# Patient Record
Sex: Female | Born: 1965 | Race: Black or African American | Hispanic: No | Marital: Married
Health system: Southern US, Academic
[De-identification: ages and names within clinical notes are randomized; demographics above are authoritative.]

## PROBLEM LIST (undated history)

## (undated) ENCOUNTER — Ambulatory Visit: Payer: PRIVATE HEALTH INSURANCE

## (undated) ENCOUNTER — Other Ambulatory Visit

## (undated) ENCOUNTER — Telehealth

## (undated) ENCOUNTER — Ambulatory Visit

## (undated) ENCOUNTER — Encounter

## (undated) ENCOUNTER — Ambulatory Visit
Payer: PRIVATE HEALTH INSURANCE | Attending: Student in an Organized Health Care Education/Training Program | Primary: Student in an Organized Health Care Education/Training Program

## (undated) ENCOUNTER — Encounter
Attending: Student in an Organized Health Care Education/Training Program | Primary: Student in an Organized Health Care Education/Training Program

## (undated) ENCOUNTER — Ambulatory Visit: Attending: Family | Primary: Family

## (undated) ENCOUNTER — Ambulatory Visit
Payer: PRIVATE HEALTH INSURANCE | Attending: Pharmacist Clinician (PhC)/ Clinical Pharmacy Specialist | Primary: Pharmacist Clinician (PhC)/ Clinical Pharmacy Specialist

## (undated) ENCOUNTER — Telehealth
Attending: Student in an Organized Health Care Education/Training Program | Primary: Student in an Organized Health Care Education/Training Program

## (undated) ENCOUNTER — Encounter: Attending: Physician Assistant | Primary: Physician Assistant

## (undated) ENCOUNTER — Encounter: Attending: Ambulatory Care | Primary: Ambulatory Care

## (undated) ENCOUNTER — Ambulatory Visit: Payer: PRIVATE HEALTH INSURANCE | Attending: Physician Assistant | Primary: Physician Assistant

## (undated) ENCOUNTER — Encounter: Attending: Family Medicine | Primary: Family Medicine

## (undated) ENCOUNTER — Ambulatory Visit: Payer: Medicaid (Managed Care)

## (undated) ENCOUNTER — Encounter: Attending: Pharmacist | Primary: Pharmacist

## (undated) ENCOUNTER — Encounter: Attending: Neurology | Primary: Neurology

## (undated) ENCOUNTER — Telehealth: Attending: Ambulatory Care | Primary: Ambulatory Care

## (undated) ENCOUNTER — Inpatient Hospital Stay

## (undated) ENCOUNTER — Ambulatory Visit: Payer: MEDICAID

## (undated) ENCOUNTER — Ambulatory Visit: Attending: Clinical | Primary: Clinical

## (undated) ENCOUNTER — Ambulatory Visit: Payer: PRIVATE HEALTH INSURANCE | Attending: Ambulatory Care | Primary: Ambulatory Care

## (undated) ENCOUNTER — Ambulatory Visit
Payer: MEDICAID | Attending: Student in an Organized Health Care Education/Training Program | Primary: Student in an Organized Health Care Education/Training Program

## (undated) ENCOUNTER — Telehealth: Attending: Family | Primary: Family

## (undated) ENCOUNTER — Ambulatory Visit: Payer: PRIVATE HEALTH INSURANCE | Attending: Family Medicine | Primary: Family Medicine

## (undated) ENCOUNTER — Ambulatory Visit: Attending: Family Medicine | Primary: Family Medicine

## (undated) ENCOUNTER — Ambulatory Visit: Attending: Physical Medicine & Rehabilitation | Primary: Physical Medicine & Rehabilitation

## (undated) ENCOUNTER — Encounter: Attending: Internal Medicine | Primary: Internal Medicine

## (undated) ENCOUNTER — Ambulatory Visit: Attending: Ambulatory Care | Primary: Ambulatory Care

## (undated) ENCOUNTER — Ambulatory Visit
Attending: Student in an Organized Health Care Education/Training Program | Primary: Student in an Organized Health Care Education/Training Program

## (undated) ENCOUNTER — Ambulatory Visit
Payer: Medicaid (Managed Care) | Attending: Student in an Organized Health Care Education/Training Program | Primary: Student in an Organized Health Care Education/Training Program

## (undated) ENCOUNTER — Encounter: Attending: Emergency Medicine | Primary: Emergency Medicine

## (undated) ENCOUNTER — Telehealth: Attending: Mental Health | Primary: Mental Health

## (undated) ENCOUNTER — Ambulatory Visit: Attending: Pharmacist | Primary: Pharmacist

## (undated) ENCOUNTER — Encounter: Attending: Family | Primary: Family

## (undated) ENCOUNTER — Ambulatory Visit: Payer: PRIVATE HEALTH INSURANCE | Attending: Internal Medicine | Primary: Internal Medicine

## (undated) ENCOUNTER — Telehealth: Attending: Pharmacist | Primary: Pharmacist

## (undated) ENCOUNTER — Inpatient Hospital Stay: Payer: PRIVATE HEALTH INSURANCE

## (undated) ENCOUNTER — Telehealth: Attending: Rheumatology | Primary: Rheumatology

## (undated) ENCOUNTER — Other Ambulatory Visit: Attending: Clinical | Primary: Clinical

## (undated) DIAGNOSIS — E119 Type 2 diabetes mellitus without complications: Secondary | ICD-10-CM

## (undated) DIAGNOSIS — I10 Essential (primary) hypertension: Secondary | ICD-10-CM

---

## 2017-06-17 ENCOUNTER — Emergency Department
Admission: EM | Admit: 2017-06-17 | Discharge: 2017-06-17 | Disposition: A | Discharge status: Home / Self Care | Source: Intra-hospital

## 2017-06-17 DIAGNOSIS — R079 Chest pain, unspecified: Principal | ICD-10-CM

## 2019-01-15 ENCOUNTER — Other Ambulatory Visit: Payer: Self-pay

## 2019-01-15 ENCOUNTER — Emergency Department
Admission: EM | Admit: 2019-01-15 | Discharge: 2019-01-16 | Disposition: A | Discharge status: Home / Self Care | Payer: Self-pay | Attending: Emergency Medicine | Admitting: Emergency Medicine

## 2019-01-15 DIAGNOSIS — I10 Essential (primary) hypertension: Secondary | ICD-10-CM | POA: Insufficient documentation

## 2019-01-15 DIAGNOSIS — R45851 Suicidal ideations: Secondary | ICD-10-CM | POA: Insufficient documentation

## 2019-01-15 DIAGNOSIS — F1414 Cocaine abuse with cocaine-induced mood disorder: Secondary | ICD-10-CM | POA: Diagnosis present

## 2019-01-15 DIAGNOSIS — F329 Major depressive disorder, single episode, unspecified: Secondary | ICD-10-CM | POA: Insufficient documentation

## 2019-01-15 DIAGNOSIS — E119 Type 2 diabetes mellitus without complications: Secondary | ICD-10-CM | POA: Insufficient documentation

## 2019-01-15 DIAGNOSIS — F17210 Nicotine dependence, cigarettes, uncomplicated: Secondary | ICD-10-CM | POA: Insufficient documentation

## 2019-01-15 HISTORY — DX: Essential (primary) hypertension: I10

## 2019-01-15 HISTORY — DX: Type 2 diabetes mellitus without complications: E11.9

## 2019-01-15 LAB — COMPREHENSIVE METABOLIC PANEL
ALT: 16 U/L (ref 0–44)
AST: 32 U/L (ref 15–41)
Albumin: 3.7 g/dL (ref 3.5–5.0)
Alkaline Phosphatase: 78 U/L (ref 38–126)
Anion gap: 15 (ref 5–15)
BUN: 7 mg/dL (ref 6–20)
CO2: 20 mmol/L — ABNORMAL LOW (ref 22–32)
Calcium: 9.1 mg/dL (ref 8.9–10.3)
Chloride: 105 mmol/L (ref 98–111)
Creatinine, Ser: 0.59 mg/dL (ref 0.44–1.00)
GFR calc Af Amer: >60 mL/min (ref >60)
GFR calc non Af Amer: >60 mL/min (ref >60)
Glucose, Bld: 326 mg/dL — ABNORMAL HIGH (ref 70–99)
Potassium: 3 mmol/L — ABNORMAL LOW (ref 3.5–5.1)
Sodium: 140 mmol/L (ref 135–145)
Total Bilirubin: 0.7 mg/dL (ref 0.3–1.2)
Total Protein: 7.2 g/dL (ref 6.5–8.1)

## 2019-01-15 LAB — CBC
HCT: 39.4 % (ref 36.0–46.0)
Hemoglobin: 12.8 g/dL (ref 12.0–15.0)
MCH: 29.3 pg (ref 26.0–34.0)
MCHC: 32.5 g/dL (ref 30.0–36.0)
MCV: 90.2 fL (ref 80.0–100.0)
Platelets: 192 10*3/uL (ref 150–400)
RBC: 4.37 MIL/uL (ref 3.87–5.11)
RDW: 14.1 % (ref 11.5–15.5)
WBC: 7.6 10*3/uL (ref 4.0–10.5)
nRBC: 0 % (ref 0.0–0.2)

## 2019-01-15 LAB — GLUCOSE, CAPILLARY: Glucose-Capillary: 288 mg/dL — ABNORMAL HIGH (ref 70–99)

## 2019-01-15 LAB — SALICYLATE LEVEL: Salicylate Lvl: <7 mg/dL (ref 2.8–30.0)

## 2019-01-15 LAB — ETHANOL: Alcohol, Ethyl (B): <10 mg/dL (ref <10)

## 2019-01-15 LAB — ACETAMINOPHEN LEVEL: Acetaminophen (Tylenol), Serum: <10 ug/mL — ABNORMAL LOW (ref 10–30)

## 2019-01-15 MED ORDER — MIRTAZAPINE 15 MG PO TABS
15.0000 mg | ORAL_TABLET | Freq: Every day | ORAL | Status: DC
  Administered 2019-01-16: 15 mg via ORAL
  Filled 2019-01-15: qty 1

## 2019-01-15 NOTE — Consult Note (Signed)
[facility] Face-to-Face Psychiatry Consult   Reason for Consult:  Suicidal ideations Referring Physician:  EDP Patient Identification: Katrina Lindsey MRN:  [id number] Principal Diagnosis: Cocaine abuse with cocaine-induced mood disorder (HCC) Diagnosis:  Principal Problem:   Cocaine abuse with cocaine-induced mood disorder (HCC)   Total Time spent with patient: 45 minutes  Subjective:   Katrina Lindsey is a 53 y.o. female patient had suicidal ideations earlier today after relapsing on cocaine 2 days ago after 14 years of being clean.  She is "very depressed" but does not want to hurt herself.  She stopped taking her medications two days ago and would like to restart these.  Agreeable to stay overnight to stabilize.  HPI:  53 yo female who presented after using cocaine and having suicidal ideations.  No current suicidal ideations but due to her depression and not being on medications for two days, would like to keep overnight to manage her medications and provide support.  No homicidal ideations, hallucinations, or withdrawal symptoms.  Sleep has been less due to cocaine use, appetite is good.  She does see a psychiatrist outpatient and prefers not to be admitted to [facility] as she feels she will be stable tomorrow.  Past Psychiatric History: depression, substance abuse  Risk to Self:  mild at this time Risk to Others:  none Prior Inpatient Therapy:  yes Prior Outpatient Therapy:  yes  Past Medical History:  Past Medical History:  Diagnosis Date  . Diabetes mellitus without complication (HCC)   . Hypertension    History reviewed. No pertinent surgical history. Family History: No family history on file. Family Psychiatric  History: none Social History:  Social History   Substance and Sexual Activity  Alcohol Use Not Currently     Social History   Substance and Sexual Activity  Drug Use Yes   Comment: crack, last use 4/29    Social History   Socioeconomic History  . Marital status:  Single    Spouse name: Not on file  . Number of children: Not on file  . Years of education: Not on file  . Highest education level: Not on file  Occupational History  . Not on file  Social Needs  . Financial resource strain: Not on file  . Food insecurity:    Worry: Not on file    Inability: Not on file  . Transportation needs:    Medical: Not on file    Non-medical: Not on file  Tobacco Use  . Smoking status: Current Every Day Smoker  Substance and Sexual Activity  . Alcohol use: Not Currently  . Drug use: Yes    Comment: crack, last use 4/29  . Sexual activity: Not on file  Lifestyle  . Physical activity:    Days per week: Not on file    Minutes per session: Not on file  . Stress: Not on file  Relationships  . Social connections:    Talks on phone: Not on file    Gets together: Not on file    Attends religious service: Not on file    Active member of club or organization: Not on file    Attends meetings of clubs or organizations: Not on file    Relationship status: Not on file  Other Topics Concern  . Not on file  Social History Narrative  . Not on file   Additional Social History:    Allergies:  Allergies no known allergies  Labs:  Results for orders placed or performed during the  hospital encounter of 01/15/19 (from the past 48 hour(s))  Comprehensive metabolic panel     Status: Abnormal   Collection Time: 01/15/19  5:37 PM  Result Value Ref Range   Sodium 140 135 - 145 mmol/L   Potassium 3.0 (L) 3.5 - 5.1 mmol/L   Chloride 105 98 - 111 mmol/L   CO2 20 (L) 22 - 32 mmol/L   Glucose, Bld 326 (H) 70 - 99 mg/dL   BUN 7 6 - 20 mg/dL   Creatinine, Ser 2.95 0.44 - 1.00 mg/dL   Calcium 9.1 8.9 - 62.1 mg/dL   Total Protein 7.2 6.5 - 8.1 g/dL   Albumin 3.7 3.5 - 5.0 g/dL   AST 32 15 - 41 U/L   ALT 16 0 - 44 U/L   Alkaline Phosphatase 78 38 - 126 U/L   Total Bilirubin 0.7 0.3 - 1.2 mg/dL   GFR calc non Af Amer >60 >60 mL/min   GFR calc Af Amer >60 >60 mL/min    Anion gap 15 5 - 15    Comment: Performed at [facility], [address] [place], [place] [address]  Ethanol     Status: None   Collection Time: 01/15/19  5:37 PM  Result Value Ref Range   Alcohol, Ethyl (B) <10 <10 mg/dL    Comment: (NOTE) Lowest detectable limit for serum alcohol is 10 mg/dL. For medical purposes only. Performed at [facility], [address] [place], [place] [address]   Salicylate level     Status: None   Collection Time: 01/15/19  5:37 PM  Result Value Ref Range   Salicylate Lvl <7.0 2.8 - 30.0 mg/dL    Comment: Performed at [facility], [address] [place], [place] [address]  Acetaminophen level     Status: Abnormal   Collection Time: 01/15/19  5:37 PM  Result Value Ref Range   Acetaminophen (Tylenol), Serum <10 (L) 10 - 30 ug/mL    Comment: (NOTE) Therapeutic concentrations vary significantly. A range of 10-30 ug/mL  may be an effective concentration for many patients. However, some  are best treated at concentrations outside of this range. Acetaminophen concentrations >150 ug/mL at 4 hours after ingestion  and >50 ug/mL at 12 hours after ingestion are often associated with  toxic reactions. Performed at [facility], [address] [place], [place] [address]   cbc     Status: None   Collection Time: 01/15/19  5:37 PM  Result Value Ref Range   WBC 7.6 4.0 - 10.5 K/uL   RBC 4.37 3.87 - 5.11 MIL/uL   Hemoglobin 12.8 12.0 - 15.0 g/dL   HCT 44.0 10.2 - 72.5 %   MCV 90.2 80.0 - 100.0 fL   MCH 29.3 26.0 - 34.0 pg   MCHC 32.5 30.0 - 36.0 g/dL   RDW 36.6 44.0 - 34.7 %   Platelets 192 150 - 400 K/uL   nRBC 0.0 0.0 - 0.2 %    Comment: Performed at [facility], [address] [place], [place] [address]    No current facility-administered medications for this encounter.    No current outpatient medications on file.    Musculoskeletal: Strength & Muscle Tone: within normal limits Gait &  Station: normal Patient leans: N/A  Psychiatric Specialty Exam: Physical Exam  Nursing note and vitals reviewed. Constitutional: She is oriented to person, place, and time. She appears well-developed and well-nourished.  Neck: Normal range of motion.  Respiratory: Effort normal.  Musculoskeletal: Normal range of motion.  Neurological: She is alert and oriented to person, place, and time.  Psychiatric: Her speech is normal and behavior is normal. Judgment normal. Her mood appears anxious. Cognition and memory are normal. She exhibits a depressed mood. She expresses suicidal ideation.    Review of Systems  Psychiatric/Behavioral: Positive for depression and substance abuse. The patient is nervous/anxious.   All other systems reviewed and are negative.   Blood pressure 120/81, pulse (!) 101, temperature 98 F (36.7 C), temperature source Oral, resp. rate 18, height 5\' 5"  (1.651 m), weight 89.8 kg, SpO2 98 %.Body mass index is 32.95 kg/m.  General Appearance: Casual  Eye Contact:  Fair  Speech:  Normal Rate  Volume:  Normal  Mood:  Anxious and Depressed  Affect:  Congruent  Thought Process:  Coherent and Descriptions of Associations: Intact  Orientation:  Full (Time, Place, and Person)  Thought Content:  Rumination  Suicidal Thoughts:  No  Homicidal Thoughts:  No  Memory:  Immediate;   Fair Recent;   Fair Remote;   Fair  Judgement:  Fair  Insight:  Fair  Psychomotor Activity:  Decreased  Concentration:  Concentration: Fair and Attention Span: Fair  Recall:  [employer]Fair  Fund of Knowledge:  Fair  Language:  Good  Akathisia:  No  Handed:  Right  AIMS (if indicated):     Assets:  Housing Leisure Time Physical Health Resilience Social Support  ADL's:  Intact  Cognition:  WNL  Sleep:        Treatment Plan Summary: Daily contact with patient to assess and evaluate symptoms and progress in treatment, Medication management and Plan cocaine abuse with cocaine induced mood  disorder:   Insomnia: -Restarted Remeron 15 mg at bedtime  Diabetes: -Had her Levemir prior to ED and reports she takes her other medications in the  Morning and can take those when she is discharged in the morning.  Disposition: Supportive therapy provided about ongoing stressors.  Nanine MeansJamison Lord, NP 01/15/2019 6:23 PM

## 2019-01-15 NOTE — ED Notes (Signed)
Cigarettes, medications, lighter, cell phone, black shoes, jeans, blue jacket, black bra, silver colored watch placed in patient belonging bag labeled with her name 1 of 1. BPD this RN and EDT, Kadijah at bedside when patient changed compliantly. Kept glasses.

## 2019-01-15 NOTE — Progress Notes (Signed)
Patient reported taking her Levemir prior to coming to the ED and does not need any of her medications until the morning.  She has them with her, locked up, and will take them at discharge in the am.  Agreeable to blood glucose monitoring but reports no sliding scale coverage.  Typically takes her blood glucose in the morning and sometimes at night, ordered before meals and at bedtime.  She just finished eating her dinner tray.  Nanine Means, PMHNP

## 2019-01-15 NOTE — ED Provider Notes (Signed)
[facility] Emergency Department Provider Note   ____________________________________________   First MD Initiated Contact with Patient 01/15/19 1753     (approximate)  I have reviewed the triage vital signs and the nursing notes.   HISTORY  Chief Complaint Suicidal    HPI Katrina Lindsey is a 53 y.o. female here for evaluation of suicidal thoughts  Patient reports that she has a history of psychiatric disease, she has a long standing history of cocaine abuse in the past but reports that she relapsed on cocaine about 3 days ago after being clean for many years.  She reports she has been feeling down and sad that she relapsed, today she started having thoughts about wanting to overdose on her home medications but has taken no action to harm herself and denies that she wants to hurt herself  at present but is concerned she started to have feelings that she wants to overdose  No chest pain.  No fevers or chills.  No recent illness.  No nausea vomiting.  She feels well at the moment, but endorses feeling very low in mood and very depressed that she started to use drugs again after so many years  Past Medical History:  Diagnosis Date  . Diabetes mellitus without complication (HCC)   . Hypertension     Patient Active Problem List   Diagnosis Date Noted  . Cocaine abuse with cocaine-induced mood disorder (HCC) 01/15/2019    History reviewed. No pertinent surgical history.  Prior to Admission medications   Not on File    Allergies Patient has no known allergies.  No family history on file.  Social History Social History   Tobacco Use  . Smoking status: Current Every Day Smoker  Substance Use Topics  . Alcohol use: Not Currently  . Drug use: Yes    Comment: crack, last use 4/29    Review of Systems Constitutional: No fever/chills Eyes: No visual changes. ENT: No sore throat. Cardiovascular: Denies chest pain. Respiratory: Denies  shortness of breath. Gastrointestinal: No abdominal pain.   Genitourinary: Negative for dysuria.  Not pregnant. Musculoskeletal: Negative for back pain. Skin: Negative for rash. Neurological: Negative for headaches, areas of focal weakness or numbness.  Denies hallucinations.  ____________________________________________   PHYSICAL EXAM:  VITAL SIGNS: ED Triage Vitals  Enc Vitals Group     BP 01/15/19 1733 120/81     Pulse Rate 01/15/19 1733 (!) 101     Resp 01/15/19 1733 18     Temp 01/15/19 1733 98 F (36.7 C)     Temp Source 01/15/19 1733 Oral     SpO2 01/15/19 1733 98 %     Weight 01/15/19 1734 198 lb (89.8 kg)     Height 01/15/19 1734 5\' 5"  (1.651 m)     Head Circumference --      Peak Flow --      Pain Score 01/15/19 1734 0     Pain Loc --      Pain Edu? --      Excl. in GC? --     Constitutional: Alert and oriented. Well appearing and in no acute distress. Eyes: Conjunctivae are normal. Head: Atraumatic. Nose: No congestion/rhinnorhea. Mouth/Throat: Mucous membranes are moist. Neck: No stridor.  Cardiovascular: Normal rate, regular rhythm. Grossly normal heart sounds.  Good peripheral circulation. Respiratory: Normal respiratory effort.  No retractions. Lungs CTAB. Gastrointestinal: Soft and nontender. No distention. Musculoskeletal: No lower extremity tenderness nor edema. Neurologic:  Normal speech and language. No  gross focal neurologic deficits are appreciated.  Skin:  Skin is warm, dry and intact. No rash noted. Psychiatric: Mood and affect are slightly flat.  Denies active desire to harm herself, reports she was having thoughts of overdosing on her medications earlier.  She does present voluntarily as well, and she reports she is in agreement with wanting to see a psychiatrist  ____________________________________________   LABS (all labs ordered are listed, but only abnormal results are displayed)  Labs Reviewed  COMPREHENSIVE METABOLIC PANEL -  Abnormal; Notable for the following components:      Result Value   Potassium 3.0 (*)    CO2 20 (*)    Glucose, Bld 326 (*)    All other components within normal limits  ACETAMINOPHEN LEVEL - Abnormal; Notable for the following components:   Acetaminophen (Tylenol), Serum <10 (*)    All other components within normal limits  GLUCOSE, CAPILLARY - Abnormal; Notable for the following components:   Glucose-Capillary 288 (*)    All other components within normal limits  ETHANOL  SALICYLATE LEVEL  CBC  URINE DRUG SCREEN, QUALITATIVE ([facility] ONLY)   ____________________________________________  EKG   ____________________________________________  RADIOLOGY   ____________________________________________   PROCEDURES  Procedure(s) performed: None  Procedures  Critical Care performed: No  ____________________________________________   INITIAL IMPRESSION / ASSESSMENT AND PLAN / ED COURSE  Pertinent labs & imaging results that were available during my care of the patient were reviewed by me and considered in my medical decision making (see chart for details).   Patient presents for suicidal thoughts.  She reports feeling remorseful about using cocaine and relapsing.  Seems to have driven her symptomatology.  She presents voluntarily, and she ripped ports and is actively seeking assistance.  She denies active acute medical symptomatology, and denies overdose and has negative a SA and acetaminophen levels with normal mental status.  ----------------------------------------- 6:24 PM on 01/15/2019 -----------------------------------------  Psychiatry nurse practitioner Asher MuirJamie, has seen the patient.  Advising that they will restart her medications and observe her overnight and is agreeable with the patient being on voluntary basis at this time.  ----------------------------------------- 12:00 AM on 01/16/2019 -----------------------------------------  Patient is now under IVC, no  family member took IVC papers out on her.  They have performed examination.  Psychiatry has been consulted and continue to follow along with the patient at this time.  Disposition pending per psychiatric team.  Ongoing care assigned to Dr. Roxan Hockeyobinson      ____________________________________________   FINAL CLINICAL IMPRESSION(S) / ED DIAGNOSES  Final diagnoses:  Suicidal ideation        Note:  This document was prepared using Dragon voice recognition software and may include unintentional dictation errors       Sharyn CreamerQuale, Cristal Qadir, MD 01/16/19 0001

## 2019-01-15 NOTE — ED Triage Notes (Signed)
Arrives to ER via BPD c/o suicidal thoughts today. Pt reports that she hasn't had thoughts of suicide for "years". Pt reports that she considered taking all of her medications at one time. Denies HI. Pt alert and oriented X4, active, cooperative, pt in NAD. RR even and unlabored, color WNL.

## 2019-01-16 NOTE — ED Notes (Signed)
SOC called report given.  SOC machine moved into patients room.  Pt. Given some OJ to help to wake up.

## 2019-01-16 NOTE — ED Provider Notes (Addendum)
-----------------------------------------   4:02 AM on 01/16/2019 -----------------------------------------   Blood pressure 120/81, pulse (!) 101, temperature 98 F (36.7 C), temperature source Oral, resp. rate 18, height 5\' 5"  (1.651 m), weight 89.8 kg, SpO2 98 %.  The patient is calm and cooperative at this time.  There have been no acute events since the last update.  Awaiting disposition plan from Behavioral Medicine team.     Willy Eddy, MD 01/16/19 [phone]

## 2019-01-16 NOTE — ED Provider Notes (Signed)
The patient has been evaluated at bedside by  [facility], psychiatry.  Patient is clinically stable.  Not felt to be a danger to self or others.  No SI or Hi.  No indication for inpatient psychiatric admission at this time.  Appropriate for continued outpatient therapy.    Willy Eddy, MD 01/16/19 [phone]

## 2019-05-18 ENCOUNTER — Emergency Department
Admission: EM | Admit: 2019-05-18 | Discharge: 2019-05-19 | Disposition: A | Discharge status: Other Acute Inpatient Hospital | Payer: Self-pay | Attending: Emergency Medicine | Admitting: Emergency Medicine

## 2019-05-18 ENCOUNTER — Encounter: Payer: Self-pay | Admitting: Emergency Medicine

## 2019-05-18 ENCOUNTER — Other Ambulatory Visit: Payer: Self-pay

## 2019-05-18 DIAGNOSIS — Z20828 Contact with and (suspected) exposure to other viral communicable diseases: Secondary | ICD-10-CM | POA: Insufficient documentation

## 2019-05-18 DIAGNOSIS — F17200 Nicotine dependence, unspecified, uncomplicated: Secondary | ICD-10-CM | POA: Insufficient documentation

## 2019-05-18 DIAGNOSIS — J45909 Unspecified asthma, uncomplicated: Secondary | ICD-10-CM | POA: Insufficient documentation

## 2019-05-18 DIAGNOSIS — R45851 Suicidal ideations: Secondary | ICD-10-CM

## 2019-05-18 DIAGNOSIS — F332 Major depressive disorder, recurrent severe without psychotic features: Secondary | ICD-10-CM | POA: Insufficient documentation

## 2019-05-18 DIAGNOSIS — I10 Essential (primary) hypertension: Secondary | ICD-10-CM | POA: Insufficient documentation

## 2019-05-18 DIAGNOSIS — F1414 Cocaine abuse with cocaine-induced mood disorder: Secondary | ICD-10-CM | POA: Insufficient documentation

## 2019-05-18 LAB — COMPREHENSIVE METABOLIC PANEL
ALT: 11 U/L (ref 0–44)
AST: 17 U/L (ref 15–41)
Albumin: 4.3 g/dL (ref 3.5–5.0)
Alkaline Phosphatase: 72 U/L (ref 38–126)
Anion gap: 11 (ref 5–15)
BUN: 13 mg/dL (ref 6–20)
CO2: 24 mmol/L (ref 22–32)
Calcium: 9.7 mg/dL (ref 8.9–10.3)
Chloride: 104 mmol/L (ref 98–111)
Creatinine, Ser: 0.71 mg/dL (ref 0.44–1.00)
GFR calc Af Amer: >60 mL/min (ref >60)
GFR calc non Af Amer: >60 mL/min (ref >60)
Glucose, Bld: 137 mg/dL — ABNORMAL HIGH (ref 70–99)
Potassium: 3.2 mmol/L — ABNORMAL LOW (ref 3.5–5.1)
Sodium: 139 mmol/L (ref 135–145)
Total Bilirubin: 0.7 mg/dL (ref 0.3–1.2)
Total Protein: 7.4 g/dL (ref 6.5–8.1)

## 2019-05-18 LAB — URINE DRUG SCREEN, QUALITATIVE (ARMC ONLY)
Amphetamines, Ur Screen: NOT DETECTED
Barbiturates, Ur Screen: NOT DETECTED
Benzodiazepine, Ur Scrn: NOT DETECTED
Cannabinoid 50 Ng, Ur ~~LOC~~: NOT DETECTED
Cocaine Metabolite,Ur ~~LOC~~: POSITIVE — AB
MDMA (Ecstasy)Ur Screen: NOT DETECTED
Methadone Scn, Ur: NOT DETECTED
Opiate, Ur Screen: NOT DETECTED
Phencyclidine (PCP) Ur S: NOT DETECTED
Tricyclic, Ur Screen: NOT DETECTED

## 2019-05-18 LAB — SARS CORONAVIRUS 2 BY RT PCR (HOSPITAL ORDER, PERFORMED IN ~~LOC~~ HOSPITAL LAB): SARS Coronavirus 2: NEGATIVE

## 2019-05-18 LAB — ETHANOL: Alcohol, Ethyl (B): <10 mg/dL (ref <10)

## 2019-05-18 LAB — ACETAMINOPHEN LEVEL: Acetaminophen (Tylenol), Serum: <10 ug/mL — ABNORMAL LOW (ref 10–30)

## 2019-05-18 LAB — SALICYLATE LEVEL: Salicylate Lvl: <7 mg/dL (ref 2.8–30.0)

## 2019-05-18 MED ORDER — TRAZODONE HCL 50 MG PO TABS
50.0000 mg | ORAL_TABLET | Freq: Every day | ORAL | Status: DC
  Administered 2019-05-18: 50 mg via ORAL
  Filled 2019-05-18: qty 1

## 2019-05-18 NOTE — ED Notes (Signed)
Pt. Alert and oriented, warm and dry, in no distress. Pt. Denies SI, HI, and AVH. Pt. Encouraged to let nursing staff know of any concerns or needs. 
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
## 2019-05-18 NOTE — ED Notes (Addendum)
RN stepped out to speak to first nurse about room in ED, pt began undressing herself into hospital attire.  Pt has cell phone, silver and white slides, pair of  Underwear, t-shirt that looks like a scrub top. Silver watch and red bag with insulin pen, lighter and cigarettes.

## 2019-05-18 NOTE — ED Notes (Signed)
Pt unable to void at this time. 

## 2019-05-19 ENCOUNTER — Inpatient Hospital Stay
Admission: RE | Admit: 2019-05-19 | Discharge: 2019-05-19 | DRG: 882 | Disposition: A | Discharge status: Home / Self Care | Payer: Self-pay | Source: Intra-hospital | Attending: Psychiatry | Admitting: Psychiatry

## 2019-05-19 DIAGNOSIS — E039 Hypothyroidism, unspecified: Secondary | ICD-10-CM

## 2019-05-19 DIAGNOSIS — E119 Type 2 diabetes mellitus without complications: Secondary | ICD-10-CM

## 2019-05-19 DIAGNOSIS — F4323 Adjustment disorder with mixed anxiety and depressed mood: Principal | ICD-10-CM | POA: Diagnosis present

## 2019-05-19 DIAGNOSIS — F250 Schizoaffective disorder, bipolar type: Secondary | ICD-10-CM | POA: Diagnosis present

## 2019-05-19 DIAGNOSIS — G4700 Insomnia, unspecified: Secondary | ICD-10-CM | POA: Diagnosis present

## 2019-05-19 DIAGNOSIS — F1414 Cocaine abuse with cocaine-induced mood disorder: Secondary | ICD-10-CM | POA: Diagnosis present

## 2019-05-19 DIAGNOSIS — F322 Major depressive disorder, single episode, severe without psychotic features: Secondary | ICD-10-CM | POA: Insufficient documentation

## 2019-05-19 DIAGNOSIS — F17200 Nicotine dependence, unspecified, uncomplicated: Secondary | ICD-10-CM | POA: Diagnosis present

## 2019-05-19 DIAGNOSIS — R45851 Suicidal ideations: Secondary | ICD-10-CM | POA: Diagnosis present

## 2019-05-19 DIAGNOSIS — I10 Essential (primary) hypertension: Secondary | ICD-10-CM | POA: Diagnosis present

## 2019-05-19 LAB — GLUCOSE, CAPILLARY
Glucose-Capillary: 278 mg/dL — ABNORMAL HIGH (ref 70–99)
Glucose-Capillary: 161 mg/dL — ABNORMAL HIGH (ref 70–99)

## 2019-05-19 MED ORDER — ATORVASTATIN CALCIUM 20 MG PO TABS: 40.0000 mg | ORAL_TABLET | Freq: Every day | ORAL | Status: DC

## 2019-05-19 MED ORDER — MAGNESIUM HYDROXIDE 400 MG/5ML PO SUSP: 30.0000 mL | Freq: Every day | ORAL | Status: DC | PRN

## 2019-05-19 MED ORDER — LORAZEPAM 1 MG PO TABS
1.0000 mg | ORAL_TABLET | Freq: Once | ORAL | Status: AC
  Administered 2019-05-19: 02:00:00 1 mg via ORAL
  Filled 2019-05-19: qty 1

## 2019-05-19 MED ORDER — LORATADINE 10 MG PO TABS: 10.0000 mg | ORAL_TABLET | Freq: Every day | ORAL | Status: DC

## 2019-05-19 MED ORDER — LISINOPRIL 20 MG PO TABS: 20.0000 mg | ORAL_TABLET | Freq: Every day | ORAL | 0 refills | Status: AC

## 2019-05-19 MED ORDER — LORATADINE 10 MG PO TABS: 10.0000 mg | ORAL_TABLET | Freq: Every day | ORAL | 0 refills | Status: AC

## 2019-05-19 MED ORDER — INSULIN ASPART PROT & ASPART (70-30 MIX) 100 UNIT/ML ~~LOC~~ SUSP
25.0000 [IU] | Freq: Two times a day (BID) | SUBCUTANEOUS | Status: DC
  Administered 2019-05-19: 08:00:00 25 [IU] via SUBCUTANEOUS
  Filled 2019-05-19: qty 10

## 2019-05-19 MED ORDER — ALUM & MAG HYDROXIDE-SIMETH 200-200-20 MG/5ML PO SUSP: 30.0000 mL | ORAL | Status: DC | PRN

## 2019-05-19 MED ORDER — DIVALPROEX SODIUM ER 500 MG PO TB24: 1000.0000 mg | ORAL_TABLET | Freq: Every day | ORAL | Status: DC

## 2019-05-19 MED ORDER — DIVALPROEX SODIUM ER 500 MG PO TB24: 1000.0000 mg | ORAL_TABLET | Freq: Every day | ORAL | 0 refills | Status: AC

## 2019-05-19 MED ORDER — TRAZODONE HCL 50 MG PO TABS: 50.0000 mg | ORAL_TABLET | Freq: Every day | ORAL | 0 refills | Status: AC

## 2019-05-19 MED ORDER — ZIPRASIDONE HCL 20 MG PO CAPS: 100.0000 mg | ORAL_CAPSULE | Freq: Every day | ORAL | Status: DC

## 2019-05-19 MED ORDER — ASPIRIN EC 81 MG PO TBEC: 81.0000 mg | DELAYED_RELEASE_TABLET | Freq: Every day | ORAL | Status: DC

## 2019-05-19 MED ORDER — LEVOTHYROXINE SODIUM 100 MCG PO TABS: 100.0000 ug | ORAL_TABLET | Freq: Every day | ORAL | Status: DC

## 2019-05-19 MED ORDER — ASPIRIN 81 MG PO TBEC: 81.0000 mg | DELAYED_RELEASE_TABLET | Freq: Every day | ORAL | 0 refills | Status: AC

## 2019-05-19 MED ORDER — ATORVASTATIN CALCIUM 40 MG PO TABS: 40.0000 mg | ORAL_TABLET | Freq: Every day | ORAL | 0 refills | Status: AC

## 2019-05-19 MED ORDER — TRAZODONE HCL 50 MG PO TABS: 50.0000 mg | ORAL_TABLET | Freq: Every day | ORAL | Status: DC

## 2019-05-19 MED ORDER — LEVOTHYROXINE SODIUM 100 MCG PO TABS: 100.0000 ug | ORAL_TABLET | Freq: Every day | ORAL | 0 refills | Status: AC

## 2019-05-19 MED ORDER — MIRTAZAPINE 15 MG PO TABS: 15.0000 mg | ORAL_TABLET | Freq: Every day | ORAL | Status: DC

## 2019-05-19 MED ORDER — ACETAMINOPHEN 325 MG PO TABS: 650.0000 mg | ORAL_TABLET | Freq: Four times a day (QID) | ORAL | Status: DC | PRN

## 2019-05-19 MED ORDER — INSULIN ASPART PROT & ASPART (70-30 MIX) 100 UNIT/ML ~~LOC~~ SUSP: 25.0000 [IU] | Freq: Two times a day (BID) | SUBCUTANEOUS | 11 refills | Status: DC

## 2019-05-19 MED ORDER — ZIPRASIDONE HCL 20 MG PO CAPS: 100.0000 mg | ORAL_CAPSULE | Freq: Every day | ORAL | 0 refills | Status: AC

## 2019-05-19 MED ORDER — LISINOPRIL 20 MG PO TABS: 20.0000 mg | ORAL_TABLET | Freq: Every day | ORAL | Status: DC

## 2019-05-19 MED ORDER — MIRTAZAPINE 15 MG PO TABS: 15.0000 mg | ORAL_TABLET | Freq: Every day | ORAL | 0 refills | Status: AC

## 2019-05-19 NOTE — Progress Notes (Signed)
Recreation Therapy Notes  INPATIENT RECREATION TR PLAN  Patient Details Name: Laverna Dossett MRN: [id number] DOB: 02-18-1966 Today's Date: 05/19/2019  Rec Therapy Plan Is patient appropriate for Therapeutic Recreation?: Yes Treatment times per week: at least 3 Estimated Length of Stay: 5-7 days TR Treatment/Interventions: Group participation (Comment)  Discharge Criteria Pt will be discharged from therapy if:: Discharged Treatment plan/goals/alternatives discussed and agreed upon by:: Patient/family  Discharge Summary Short term goals set: Patient will engage in groups without prompting or encouragement from LRT x3 group sessions within 5 recreation therapy group sessions Short term goals met: Not met Reason goals not met: Patient did not attend any groups Therapeutic equipment acquired: N/A Reason patient discharged from therapy: Discharge from hospital Pt/family agrees with progress & goals achieved: Yes Date patient discharged from therapy: 05/19/19   Tandrea Kommer 05/19/2019, 1:25 PM

## 2019-05-19 NOTE — Plan of Care (Signed)
Patient newly admitted, hasn't had time to progress.   Problem: Education: Goal: Knowledge of [place] [facility] Education information/materials will improve Outcome: Not Progressing Goal: Emotional status will improve Outcome: Not Progressing Goal: Mental status will improve Outcome: Not Progressing Goal: Verbalization of understanding the information provided will improve Outcome: Not Progressing   Problem: Safety: Goal: Periods of time without injury will increase Outcome: Not Progressing   Problem: Education: Goal: Ability to state activities that reduce stress will improve Outcome: Not Progressing   

## 2019-05-19 NOTE — Progress Notes (Signed)
D - Patient received from [facility] Emergency Department, report received from Maudie Mercury, [place]. Skin search completed with Britt Bolognese, RN. No abnormalities found, no contraband found on the patient. Patient stated, "They woke me up to come down here, I just want to go to bed." Patient given education and assessed. Patient denies SI/HI/AVH, pain, anxiety and depression with this Probation officer. Patient Agitated, MD called, (See MAR). Patient kept asking for food, patient given education.   A - Patient compliant with medication administration per MD orders. Patient oriented to her room and the around the unit. Patient given education. Patient given support and encouragement to be active in her treatment plan. Patient informed to let staff know if there are any issues or problems on the unit.   R - Patient being monitored Q 15 minutes for safety per unit protocol. Patient remains safe on the unit.

## 2019-05-19 NOTE — Progress Notes (Signed)
  [facility] Adult Case Management Discharge Plan :  Will you be returning to the same living situation after discharge:  Yes,  pt says she will go to her daughter's house [place] At discharge, do you have transportation home?: Yes,  taxi voucher Do you have the ability to pay for your medications: No.  Release of information consent forms completed and in the chart;  Patient's signature needed at discharge.  Patient to Follow up at: Follow-up Information    Pt declined Follow up.           Next level of care provider has access to [place] and Suicide Prevention discussed: No.  Have you used any form of tobacco in the last 30 days? (Cigarettes, Smokeless Tobacco, Cigars, and/or Pipes): Yes  Has patient been referred to the Quitline?: N/A patient is not a smoker  Patient has been referred for addiction treatment: N/A  Yvette Rack, LCSW 05/19/2019, 1:34 PM

## 2019-05-19 NOTE — BHH Counselor (Signed)
CSW provided pt with a list of community mental health providers.

## 2019-05-19 NOTE — Progress Notes (Signed)
Recreation Therapy Notes  INPATIENT RECREATION THERAPY ASSESSMENT  Patient Details Name: Tate Jerkins MRN: [id number] DOB: [birth year] Today's Date: 05/19/2019       Information Obtained From: Patient(Patient refused assessment stated she was done with questions and talking)  Able to Participate in Assessment/Interview:    Patient Presentation:    Reason for Admission (Per Patient):    Patient Stressors:    Coping Skills:      Leisure Interests (2+):     Frequency of Recreation/Participation:    Awareness of Community Resources:     [employer]:     Current Use:    If no, Barriers?:    Expressed Interest in [place] of Residence:     Patient Main Form of Transportation:    Patient Strengths:     Patient Identified Areas of Improvement:     Patient Goal for Hospitalization:     Current SI (including self-harm):     Current HI:     Current AVH:    Staff Intervention Plan:    Consent to Intern Participation:    Eyva Califano 05/19/2019, 1:00 PM

## 2019-05-19 NOTE — BHH Counselor (Signed)
Pt scheduled to discharge today. CSW met with pt to discuss her discharge plans. Pt says she has an appointment with her psychiatrist at [facility] on 9/7 and declined signing ROI. Pt states "I can schedule my own appointment."

## 2019-05-19 NOTE — Tx Team (Signed)
Initial Treatment Plan 05/19/2019 2:00 AM Katrina Lindsey YNW:[id number]    PATIENT STRESSORS: Medication change or noncompliance Substance abuse   PATIENT STRENGTHS: Motivation for treatment/growth Supportive family/friends   PATIENT IDENTIFIED PROBLEMS: Suicidal Ideation  Anxiety  Depression                 DISCHARGE CRITERIA:  Motivation to continue treatment in a less acute level of care Verbal commitment to aftercare and medication compliance  PRELIMINARY DISCHARGE PLAN: Outpatient therapy Return to previous living arrangement  PATIENT/FAMILY INVOLVEMENT: This treatment plan has been presented to and reviewed with the patient, Katrina Lindsey. The patient has been given the opportunity to ask questions and make suggestions.  [name], RN 05/19/2019, 2:00 AM

## 2019-05-19 NOTE — [facility] Group Notes (Signed)
LCSW Group Therapy Note  05/19/2019 1:00 PM  Type of Therapy/Topic:  Group Therapy:  Emotion Regulation  Participation Level:  Did Not Attend   Description of Group:   The purpose of this group is to assist patients in learning to regulate negative emotions and experience positive emotions. Patients will be guided to discuss ways in which they have been vulnerable to their negative emotions. These vulnerabilities will be juxtaposed with experiences of positive emotions or situations, and patients will be challenged to use positive emotions to combat negative ones. Special emphasis will be placed on coping with negative emotions in conflict situations, and patients will process healthy conflict resolution skills.  Therapeutic Goals: 1. Patient will identify two positive emotions or experiences to reflect on in order to balance out negative emotions 2. Patient will label two or more emotions that they find the most difficult to experience 3. Patient will demonstrate positive conflict resolution skills through discussion and/or role plays  Summary of Patient Progress: X  Therapeutic Modalities:   Cognitive Behavioral Therapy Feelings Identification Dialectical Behavioral Therapy  Assunta Curtis, MSW, LCSW 05/19/2019 12:32 PM

## 2019-05-19 NOTE — Progress Notes (Signed)
Discharge Note:  D:Patient denies SI/HI at this time. Pt appears calm and cooperative, and no distress noted. AVS/Follow-up/Prescriptions reviewed with patient and she verbalized understanding and written copies given to patient.   A: All Personal items in locker returned to pt. Pt escorted out of the building.  R:  Pt States she will utilize information for community resources provided to her, and take MEDS as prescribed.

## 2019-05-19 NOTE — Plan of Care (Signed)
  Problem: Group Participation Goal: STG - Patient will engage in groups without prompting or encouragement from LRT x3 group sessions within 5 recreation therapy group sessions Description: STG - Patient will engage in groups without prompting or encouragement from LRT x3 group sessions within 5 recreation therapy group sessions 05/19/2019 1324 by Ernest Haber, LRT Outcome: Not Applicable 09/24/4172 [id number] by Ernest Haber, LRT Outcome: Not Met (add Reason) Note: Patient did not attend any groups  05/19/2019 1324 by Ernest Haber, LRT Outcome: Not Met (add Reason)

## 2019-05-19 NOTE — Plan of Care (Signed)
  Problem: Education: Goal: Emotional status will improve Outcome: Progressing Note: Patient is in a better mood than on admission states she just wants to go home   Problem: Safety: Goal: Periods of time without injury will increase Outcome: Progressing Note: Q 15 minute checks in progress and patient remains safe on unit

## 2019-05-19 NOTE — Discharge Summary (Signed)
Physician Discharge Summary Note  Patient:  Katrina Lindsey is an 53 y.o., female MRN:  [id number] DOB:  03-18-1966 Patient phone:  [phone] (home)  Patient address:   [address] [place] [address],  Total Time spent with patient: 1 hour  Date of Admission:  05/19/2019 Date of Discharge: May 19, 2019  Reason for Admission: Admitted through the emergency room voluntarily yesterday.  She presented with agitated and anxious depressed mood and thoughts of vague suicidal ideation related to multiple recent stressors  Principal Problem: Adjustment disorder with mixed anxiety and depressed mood Discharge Diagnoses: Principal Problem:   Adjustment disorder with mixed anxiety and depressed mood Active Problems:   Cocaine abuse with cocaine-induced mood disorder (HCC)   Schizoaffective disorder, bipolar type (HCC)   Diabetes (HCC)   Essential hypertension   Hypothyroidism   Past Psychiatric History: History of chronic mental health problems going back many years.  Diagnosis evidently of bipolar or schizoaffective disorder.  Most recent prescriptions have been Geodon 100 mg/day, Depakote 1000 mg at night, mirtazapine 15 mg at night.  Patient sees an outpatient provider through [facility].  Last suicide attempt many many years ago.  History of sobriety but recent relapse into cocaine use  Past Medical History:  Past Medical History:  Diagnosis Date  . Diabetes mellitus without complication (HCC)   . Hypertension    History reviewed. No pertinent surgical history. Family History: History reviewed. No pertinent family history. Family Psychiatric  History: None reported Social History:  Social History   Substance and Sexual Activity  Alcohol Use Not Currently     Social History   Substance and Sexual Activity  Drug Use Yes   Comment: crack, last use 4/29    Social History   Socioeconomic History  . Marital status: Single    Spouse name: Not on file  . Number of  children: Not on file  . Years of education: Not on file  . Highest education level: Not on file  Occupational History  . Not on file  Social Needs  . Financial resource strain: Not on file  . Food insecurity    Worry: Not on file    Inability: Not on file  . Transportation needs    Medical: Not on file    Non-medical: Not on file  Tobacco Use  . Smoking status: Current Every Day Smoker  . Smokeless tobacco: Never Used  Substance and Sexual Activity  . Alcohol use: Not Currently  . Drug use: Yes    Comment: crack, last use 4/29  . Sexual activity: Not on file  Lifestyle  . Physical activity    Days per week: Not on file    Minutes per session: Not on file  . Stress: Not on file  Relationships  . Social Musician on phone: Not on file    Gets together: Not on file    Attends religious service: Not on file    Active member of club or organization: Not on file    Attends meetings of clubs or organizations: Not on file    Relationship status: Not on file  Other Topics Concern  . Not on file  Social History Narrative  . Not on file    Hospital Course: Patient admitted to the psychiatric ward.  Displayed no dangerous or suicidal behavior.  Cooperative with admission process and evaluation.  Patient has consistently denied suicidal ideation.  On interview today she clearly argues that because her son who has  apparently very severe cancer is back in the hospital she feels and enormous need to be back with him.  She denies suicidal ideation.  She agrees to a plan to take her medication.  She understands the need to maintain sobriety.  She is calm and lucid with good insight.  At this point does not meet commitment criteria.  Does not seem to be reasonable to keep her in the hospital in her current situation.  I have written prescriptions for her medications.  She is to follow-up with her outpatient providers.  Psychoeducation and supportive therapy completed.  Case reviewed  with treatment team.  She can be discharged today.  Physical Findings: AIMS: Facial and Oral Movements Muscles of Facial Expression: None, normal Lips and Perioral Area: None, normal Jaw: None, normal Tongue: None, normal,Extremity Movements Upper (arms, wrists, hands, fingers): None, normal Lower (legs, knees, ankles, toes): None, normal, Trunk Movements Neck, shoulders, hips: None, normal, Overall Severity Severity of abnormal movements (highest score from questions above): None, normal Incapacitation due to abnormal movements: None, normal Patient's awareness of abnormal movements (rate only patient's report): No Awareness, Dental Status Current problems with teeth and/or dentures?: Yes(some missing teeth on the top.) Does patient usually wear dentures?: No  CIWA:  CIWA-Ar Total: 0 COWS:  COWS Total Score: 9  Musculoskeletal: Strength & Muscle Tone: within normal limits Gait & Station: unsteady Patient leans: N/A  Psychiatric Specialty Exam: Physical Exam  Nursing note and vitals reviewed. Constitutional: She appears well-developed and well-nourished.  HENT:  Head: Normocephalic and atraumatic.  Eyes: Pupils are equal, round, and reactive to light. Conjunctivae are normal.  Neck: Normal range of motion.  Cardiovascular: Regular rhythm and normal heart sounds.  Respiratory: Effort normal. No respiratory distress.  GI: Soft.  Musculoskeletal: Normal range of motion.  Neurological: She is alert.  Skin: Skin is warm and dry.  Psychiatric: Her speech is normal and behavior is normal. Thought content normal. Her mood appears anxious. Cognition and memory are normal. She expresses impulsivity. She exhibits a depressed mood.    Review of Systems  Constitutional: Negative.   HENT: Negative.   Eyes: Negative.   Respiratory: Negative.   Cardiovascular: Negative.   Gastrointestinal: Negative.   Musculoskeletal: Negative.   Skin: Negative.   Neurological: Negative.    Psychiatric/Behavioral: Positive for depression and substance abuse. Negative for hallucinations and suicidal ideas. The patient is nervous/anxious and has insomnia.     Blood pressure 115/86, pulse 93, temperature 97.9 F (36.6 C), temperature source Oral, resp. rate 16, height 5\' 7"  (1.702 m), weight 81.6 kg, SpO2 97 %.Body mass index is 28.19 kg/m.  General Appearance: Casual  Eye Contact:  Fair  Speech:  Clear and Coherent  Volume:  Normal  Mood:  Anxious and Depressed  Affect:  Congruent  Thought Process:  Goal Directed  Orientation:  Full (Time, Place, and Person)  Thought Content:  Logical  Suicidal Thoughts:  No  Homicidal Thoughts:  No  Memory:  Immediate;   Fair Recent;   Fair Remote;   Fair  Judgement:  Fair  Insight:  Fair  Psychomotor Activity:  Increased  Concentration:  Concentration: Fair  Recall:  Fis[employer]Fair  Fund of Knowledge:  Fair  Language:  Fair  Akathisia:  No  Handed:  Right  AIMS (if indicated):     Assets:  Desire for Improvement Social Support  ADL's:  Intact  Cognition:  WNL  Sleep:        Have you used any form  of tobacco in the last 30 days? (Cigarettes, Smokeless Tobacco, Cigars, and/or Pipes): Yes  Has this patient used any form of tobacco in the last 30 days? (Cigarettes, Smokeless Tobacco, Cigars, and/or Pipes) Yes, Yes, A prescription for an FDA-approved tobacco cessation medication was offered at discharge and the patient refused  Blood Alcohol level:  Lab Results  Component Value Date   ETH <10 05/18/2019   ETH <10 87/56/4332    Metabolic Disorder Labs:  No results found for: HGBA1C, MPG No results found for: PROLACTIN No results found for: CHOL, TRIG, HDL, CHOLHDL, VLDL, LDLCALC  See Psychiatric Specialty Exam and Suicide Risk Assessment completed by Attending Physician prior to discharge.  Discharge destination:  Home  Is patient on multiple antipsychotic therapies at discharge:  No   Has Patient had three or more failed  trials of antipsychotic monotherapy by history:  No  Recommended Plan for Multiple Antipsychotic Therapies: NA  Discharge Instructions    Diet - low sodium heart healthy   Complete by: As directed    Increase activity slowly   Complete by: As directed      Allergies as of 05/19/2019   No Known Allergies     Medication List    TAKE these medications     Indication  aspirin 81 MG EC tablet Take 1 tablet (81 mg total) by mouth daily.  Indication: Stable Angina Pectoris   atorvastatin 40 MG tablet Commonly known as: LIPITOR Take 1 tablet (40 mg total) by mouth daily at 6 PM.  Indication: High Amount of Fats in the Blood   divalproex 500 MG 24 hr tablet Commonly known as: DEPAKOTE ER Take 2 tablets (1,000 mg total) by mouth at bedtime.  Indication: MIXED BIPOLAR AFFECTIVE DISORDER   insulin aspart protamine- aspart (70-30) 100 UNIT/ML injection Commonly known as: NOVOLOG MIX 70/30 Inject 0.25 mLs (25 Units total) into the skin 2 (two) times daily with a meal.  Indication: Type 2 Diabetes   levothyroxine 100 MCG tablet Commonly known as: SYNTHROID Take 1 tablet (100 mcg total) by mouth daily at 6 (six) AM. Start taking on: May 20, 2019  Indication: Underactive Thyroid caused by Medical Treatment   lisinopril 20 MG tablet Commonly known as: ZESTRIL Take 1 tablet (20 mg total) by mouth daily.  Indication: High Blood Pressure Disorder   loratadine 10 MG tablet Commonly known as: CLARITIN Take 1 tablet (10 mg total) by mouth daily.  Indication: Hayfever   mirtazapine 15 MG tablet Commonly known as: REMERON Take 1 tablet (15 mg total) by mouth at bedtime.  Indication: Major Depressive Disorder   traZODone 50 MG tablet Commonly known as: DESYREL Take 1 tablet (50 mg total) by mouth at bedtime.  Indication: Trouble Sleeping   ziprasidone 20 MG capsule Commonly known as: GEODON Take 5 capsules (100 mg total) by mouth daily with breakfast. Start taking on:  May 20, 2019  Indication: Manic-Depression        Follow-up recommendations:  Activity:  Activity as tolerated Diet:  Regular diet Other:  Follow-up with outpatient providers and continue current medicine  Comments: Patient agrees to plan.  Signed: Alethia Berthold, MD 05/19/2019, 12:41 PM
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
## 2019-05-19 NOTE — Progress Notes (Signed)
Recreation Therapy Notes  Date: 05/19/2019  Time: 9:30 am   Location: Craft room   Behavioral response: N/A   Intervention Topic: Goals  Discussion/Intervention: Patient did not attend group.   Clinical Observations/Feedback:  Patient did not attend group.   Alzina Golda LRT/CTRS        Sevin Langenbach 05/19/2019 10:54 AM

## 2019-05-19 NOTE — [facility] Counselor (Signed)
CSW attempted several times to complete the patient's PSA.  Patient was upset and angry as evidenced by self-reports and frequent statements of such.  Patient refused to complete PSA stating that "This is stupid! I am ready to leave this motherf*cker!"  CSW attempted several times.   Assunta Curtis, MSW, LCSW 05/19/2019 9:28 AM

## 2019-05-19 NOTE — BHH Suicide Risk Assessment (Signed)
[facility] Discharge Suicide Risk Assessment   Principal Problem: Adjustment disorder with mixed anxiety and depressed mood Discharge Diagnoses: Principal Problem:   Adjustment disorder with mixed anxiety and depressed mood Active Problems:   Cocaine abuse with cocaine-induced mood disorder (HCC)   Schizoaffective disorder, bipolar type ([place])   Diabetes ([place])   Essential hypertension   Hypothyroidism   Total Time spent with patient: 1 hour  Musculoskeletal: Strength & Muscle Tone: within normal limits Gait & Station: unsteady Patient leans: N/A  Psychiatric Specialty Exam: Review of Systems  Constitutional: Negative.   HENT: Negative.   Eyes: Negative.   Respiratory: Negative.   Cardiovascular: Negative.   Gastrointestinal: Negative.   Musculoskeletal: Negative.   Skin: Negative.   Neurological: Negative.   Psychiatric/Behavioral: Positive for depression and substance abuse. Negative for hallucinations and suicidal ideas. The patient is nervous/anxious and has insomnia.     Blood pressure 115/86, pulse 93, temperature 97.9 F (36.6 C), temperature source Oral, resp. rate 16, height 5\' 7"  (1.702 m), weight 81.6 kg, SpO2 97 %.Body mass index is 28.19 kg/m.  General Appearance: Casual  Eye Contact::  Good  Speech:  Normal Rate409  Volume:  Normal  Mood:  Anxious, Depressed and Dysphoric  Affect:  Congruent  Thought Process:  Coherent  Orientation:  Full (Time, Place, and Person)  Thought Content:  Logical  Suicidal Thoughts:  No  Homicidal Thoughts:  No  Memory:  Immediate;   Fair Recent;   Fair Remote;   Fair  Judgement:  Fair  Insight:  Fair  Psychomotor Activity:  Normal  Concentration:  Fair  Recall:  [employer] of Knowledge:Fair  Language: Fair  Akathisia:  No  Handed:  Right  AIMS (if indicated):     Assets:  Desire for Improvement Social Support  Sleep:     Cognition: WNL  ADL's:  Intact   Mental Status Per Nursing Assessment::   On Admission:   Self-harm thoughts  Demographic Factors:  Unemployed  Loss Factors: Loss of significant relationship  Historical Factors: Impulsivity  Risk Reduction Factors:   Sense of responsibility to family, Religious beliefs about death, Living with another person, especially a relative, Positive social support and Positive therapeutic relationship  Continued Clinical Symptoms:  Bipolar Disorder:   Mixed State Alcohol/Substance Abuse/Dependencies  Cognitive Features That Contribute To Risk:  None    Suicide Risk:  Minimal: No identifiable suicidal ideation.  Patients presenting with no risk factors but with morbid ruminations; may be classified as minimal risk based on the severity of the depressive symptoms    Plan Of Care/Follow-up recommendations:  Activity:  Activity as tolerated Diet:  Regular diet Other:  Follow-up with outpatient treatment for medical and psychiatric needs and stop abuse of cocaine.  Alethia Berthold, MD 05/19/2019, 12:30 PM

## 2019-05-19 NOTE — BH Assessment (Signed)
Patient is to be admitted to [facility] by Psychiatric Nurse Practitioner Caroline Sauger.  Attending Physician will be Dr. Weber Cooks.   Patient has been assigned to room 309, by [facility] Charge Nurse Donnita Falls.   Intake Paper Work has been signed and placed on patient chart.  ER staff is aware of the admission:  Dr. Joni Fears, ER MD   Joelene Millin, Patient's Nurse   Sharyn Lull, Patient Access.
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
## 2019-05-19 NOTE — Progress Notes (Signed)
D: Pt denies SI/HI/AV hallucinations. Pt is pleasant but irritable and wants to be discharged.  A: Pt was offered support and encouragement. Pt was given scheduled medications. Pt was encourage to attend groups. Q 15 minute checks were done for safety.  R: Pt is taking medication. Pt has no complaints.Pt receptive to treatment and safety maintained on unit.

## 2019-05-19 NOTE — BHH Suicide Risk Assessment (Signed)
[facility] Admission Suicide Risk Assessment   Nursing information obtained from:  Patient Demographic factors:  Low socioeconomic status Current Mental Status:  Self-harm thoughts Loss Factors:  Loss of significant relationship Historical Factors:  Anniversary of important loss Risk Reduction Factors:  Positive social support  Total Time spent with patient: 1 hour Principal Problem: Adjustment disorder with mixed anxiety and depressed mood Diagnosis:  Principal Problem:   Adjustment disorder with mixed anxiety and depressed mood Active Problems:   Cocaine abuse with cocaine-induced mood disorder (HCC)   Schizoaffective disorder, bipolar type ([place])   Diabetes ([place])   Essential hypertension   Hypothyroidism  Subjective Data: Patient seen and chart reviewed.  Patient with multiple medical problems as well as chronic mental health problems and cocaine abuse came to the emergency room yesterday distraught saying that she was having some suicidal thoughts.  Had not done anything to harm her self.  Today she has been consistently denying any suicidal ideation thoughts or plan.  Denying any current psychotic symptoms.  Acknowledges that she has been under a great deal of emotional stress.  Articulates a clear and reasonable plan for taking care of herself.  Does not feel that she needs to be in the hospital.  Continued Clinical Symptoms:  Alcohol Use Disorder Identification Test Final Score (AUDIT): 0 The "Alcohol Use Disorders Identification Test", Guidelines for Use in Primary Care, Second Edition.  World Pharmacologist [facility]). Score between 0-7:  no or low risk or alcohol related problems. Score between 8-15:  moderate risk of alcohol related problems. Score between 16-19:  high risk of alcohol related problems. Score 20 or above:  warrants further diagnostic evaluation for alcohol dependence and treatment.   CLINICAL FACTORS:   Bipolar Disorder:   Mixed State Alcohol/Substance  Abuse/Dependencies   Musculoskeletal: Strength & Muscle Tone: within normal limits Gait & Station: unsteady Patient leans: N/A  Psychiatric Specialty Exam: Physical Exam  Nursing note and vitals reviewed. Constitutional: She appears well-developed and well-nourished.  HENT:  Head: Normocephalic and atraumatic.  Eyes: Pupils are equal, round, and reactive to light. Conjunctivae are normal.  Neck: Normal range of motion.  Cardiovascular: Regular rhythm and normal heart sounds.  Respiratory: Effort normal. No respiratory distress.  GI: Soft.  Musculoskeletal: Normal range of motion.  Neurological: She is alert.  Skin: Skin is warm and dry.  Psychiatric: Her speech is normal. Her mood appears anxious. She is agitated. She is not aggressive. Thought content is not paranoid. Cognition and memory are normal. She expresses impulsivity. She expresses no homicidal and no suicidal ideation.    Review of Systems  Constitutional: Negative.   HENT: Negative.   Eyes: Negative.   Respiratory: Negative.   Cardiovascular: Negative.   Gastrointestinal: Negative.   Musculoskeletal: Negative.   Skin: Negative.   Neurological: Negative.   Psychiatric/Behavioral: Positive for depression and substance abuse. Negative for hallucinations, memory loss and suicidal ideas. The patient is nervous/anxious and has insomnia.     Blood pressure 115/86, pulse 93, temperature 97.9 F (36.6 C), temperature source Oral, resp. rate 16, height 5\' 7"  (1.702 m), weight 81.6 kg, SpO2 97 %.Body mass index is 28.19 kg/m.  General Appearance: Disheveled  Eye Contact:  Fair  Speech:  Normal Rate  Volume:  Normal  Mood:  Anxious and Depressed  Affect:  Tearful  Thought Process:  Coherent  Orientation:  Full (Time, Place, and Person)  Thought Content:  Logical  Suicidal Thoughts:  No  Homicidal Thoughts:  No  Memory:  Immediate;   Fair Recent;   Fair Remote;   Fair  Judgement:  Fair  Insight:  Fair   Psychomotor Activity:  Normal  Concentration:  Concentration: Fair  Recall:  [employer]  Fund of Knowledge:  Fair  Language:  Fair  Akathisia:  No  Handed:  Right  AIMS (if indicated):     Assets:  Desire for Improvement Social Support  ADL's:  Intact  Cognition:  WNL  Sleep:         COGNITIVE FEATURES THAT CONTRIBUTE TO RISK:  None    SUICIDE RISK:   Mild:  Suicidal ideation of limited frequency, intensity, duration, and specificity.  There are no identifiable plans, no associated intent, mild dysphoria and related symptoms, good self-control (both objective and subjective assessment), few other risk factors, and identifiable protective factors, including available and accessible social support.  PLAN OF CARE: Patient currently lucid and denies any suicidal ideation.  Has had no suicide attempts as an adult.  Has clear positive plans for taking care of herself and her family in the future.  Agrees to follow-up with her usual psychiatric and medical providers.  Patient will likely be discharged today as she is requesting to go and hat a family emergency.  Does not appear at this point to meet commitment criteria.  I certify that inpatient services furnished can reasonably be expected to improve the patient's condition.   Mordecai RasmussenJohn Kelsha Older, MD 05/19/2019, 12:17 PM

## 2019-05-20 LAB — CBC
HCT: 47.5 % — ABNORMAL HIGH (ref 36.0–46.0)
Hemoglobin: 12.9 g/dL (ref 12.0–15.0)
MCH: 29.6 pg (ref 26.0–34.0)
MCHC: 27.2 g/dL — ABNORMAL LOW (ref 30.0–36.0)
MCV: 88.5 fL (ref 80.0–100.0)
Platelets: 278 10*3/uL (ref 150–400)
RBC: 4.36 MIL/uL (ref 3.87–5.11)
RDW: 14.5 % (ref 11.5–15.5)
WBC: 8.1 10*3/uL (ref 4.0–10.5)
nRBC: 0 % (ref 0.0–0.2)

## 2019-05-25 ENCOUNTER — Emergency Department
Admission: EM | Admit: 2019-05-25 | Discharge: 2019-05-26 | Disposition: A | Discharge status: Home / Self Care | Payer: Self-pay | Attending: Student | Admitting: Student

## 2019-05-25 ENCOUNTER — Encounter: Payer: Self-pay | Admitting: Emergency Medicine

## 2019-05-25 ENCOUNTER — Other Ambulatory Visit: Payer: Self-pay

## 2019-05-25 DIAGNOSIS — E039 Hypothyroidism, unspecified: Secondary | ICD-10-CM | POA: Insufficient documentation

## 2019-05-25 DIAGNOSIS — Z046 Encounter for general psychiatric examination, requested by authority: Secondary | ICD-10-CM

## 2019-05-25 DIAGNOSIS — Z79899 Other long term (current) drug therapy: Secondary | ICD-10-CM | POA: Insufficient documentation

## 2019-05-25 DIAGNOSIS — F17200 Nicotine dependence, unspecified, uncomplicated: Secondary | ICD-10-CM | POA: Insufficient documentation

## 2019-05-25 DIAGNOSIS — I10 Essential (primary) hypertension: Secondary | ICD-10-CM | POA: Insufficient documentation

## 2019-05-25 DIAGNOSIS — F322 Major depressive disorder, single episode, severe without psychotic features: Secondary | ICD-10-CM | POA: Diagnosis present

## 2019-05-25 DIAGNOSIS — F332 Major depressive disorder, recurrent severe without psychotic features: Secondary | ICD-10-CM | POA: Diagnosis present

## 2019-05-25 DIAGNOSIS — F1490 Cocaine use, unspecified, uncomplicated: Secondary | ICD-10-CM | POA: Insufficient documentation

## 2019-05-25 DIAGNOSIS — F1414 Cocaine abuse with cocaine-induced mood disorder: Secondary | ICD-10-CM | POA: Diagnosis present

## 2019-05-25 DIAGNOSIS — E119 Type 2 diabetes mellitus without complications: Secondary | ICD-10-CM | POA: Insufficient documentation

## 2019-05-25 DIAGNOSIS — Z7982 Long term (current) use of aspirin: Secondary | ICD-10-CM | POA: Insufficient documentation

## 2019-05-25 DIAGNOSIS — F4323 Adjustment disorder with mixed anxiety and depressed mood: Secondary | ICD-10-CM | POA: Diagnosis present

## 2019-05-25 DIAGNOSIS — F250 Schizoaffective disorder, bipolar type: Secondary | ICD-10-CM | POA: Diagnosis present

## 2019-05-25 DIAGNOSIS — F209 Schizophrenia, unspecified: Secondary | ICD-10-CM | POA: Insufficient documentation

## 2019-05-25 DIAGNOSIS — Z794 Long term (current) use of insulin: Secondary | ICD-10-CM | POA: Insufficient documentation

## 2019-05-25 LAB — URINE DRUG SCREEN, QUALITATIVE (ARMC ONLY)
Amphetamines, Ur Screen: NOT DETECTED
Barbiturates, Ur Screen: NOT DETECTED
Benzodiazepine, Ur Scrn: NOT DETECTED
Cannabinoid 50 Ng, Ur ~~LOC~~: NOT DETECTED
Cocaine Metabolite,Ur ~~LOC~~: POSITIVE — AB
MDMA (Ecstasy)Ur Screen: NOT DETECTED
Methadone Scn, Ur: NOT DETECTED
Opiate, Ur Screen: NOT DETECTED
Phencyclidine (PCP) Ur S: NOT DETECTED
Tricyclic, Ur Screen: NOT DETECTED

## 2019-05-25 LAB — COMPREHENSIVE METABOLIC PANEL
ALT: 13 U/L (ref 0–44)
AST: 16 U/L (ref 15–41)
Albumin: 4.1 g/dL (ref 3.5–5.0)
Alkaline Phosphatase: 73 U/L (ref 38–126)
Anion gap: 10 (ref 5–15)
BUN: 13 mg/dL (ref 6–20)
CO2: 25 mmol/L (ref 22–32)
Calcium: 9.3 mg/dL (ref 8.9–10.3)
Chloride: 106 mmol/L (ref 98–111)
Creatinine, Ser: 0.49 mg/dL (ref 0.44–1.00)
GFR calc Af Amer: >60 mL/min (ref >60)
GFR calc non Af Amer: >60 mL/min (ref >60)
Glucose, Bld: 102 mg/dL — ABNORMAL HIGH (ref 70–99)
Potassium: 3.1 mmol/L — ABNORMAL LOW (ref 3.5–5.1)
Sodium: 141 mmol/L (ref 135–145)
Total Bilirubin: 0.7 mg/dL (ref 0.3–1.2)
Total Protein: 7.2 g/dL (ref 6.5–8.1)

## 2019-05-25 LAB — SALICYLATE LEVEL: Salicylate Lvl: <7 mg/dL (ref 2.8–30.0)

## 2019-05-25 LAB — CBC
HCT: 38.7 % (ref 36.0–46.0)
Hemoglobin: 13 g/dL (ref 12.0–15.0)
MCH: 29.8 pg (ref 26.0–34.0)
MCHC: 33.6 g/dL (ref 30.0–36.0)
MCV: 88.8 fL (ref 80.0–100.0)
Platelets: 320 10*3/uL (ref 150–400)
RBC: 4.36 MIL/uL (ref 3.87–5.11)
RDW: 15.4 % (ref 11.5–15.5)
WBC: 7.3 10*3/uL (ref 4.0–10.5)
nRBC: 0 % (ref 0.0–0.2)

## 2019-05-25 LAB — ACETAMINOPHEN LEVEL: Acetaminophen (Tylenol), Serum: <10 ug/mL — ABNORMAL LOW (ref 10–30)

## 2019-05-25 LAB — ETHANOL: Alcohol, Ethyl (B): <10 mg/dL (ref <10)

## 2019-05-25 LAB — GLUCOSE, CAPILLARY: Glucose-Capillary: 265 mg/dL — ABNORMAL HIGH (ref 70–99)

## 2019-05-25 LAB — POCT PREGNANCY, URINE: Preg Test, Ur: NEGATIVE

## 2019-05-25 MED ORDER — ZIPRASIDONE HCL 80 MG PO CAPS
100.0000 mg | ORAL_CAPSULE | Freq: Every day | ORAL | Status: DC
  Administered 2019-05-26: 12:00:00 100 mg via ORAL
  Filled 2019-05-25: qty 1

## 2019-05-25 MED ORDER — LORAZEPAM 1 MG PO TABS
1.0000 mg | ORAL_TABLET | Freq: Once | ORAL | Status: AC
  Administered 2019-05-25: 21:00:00 1 mg via ORAL
  Filled 2019-05-25: qty 1

## 2019-05-25 MED ORDER — LORATADINE 10 MG PO TABS
10.0000 mg | ORAL_TABLET | Freq: Every day | ORAL | Status: DC
  Administered 2019-05-26: 12:00:00 10 mg via ORAL
  Filled 2019-05-25: qty 1

## 2019-05-25 MED ORDER — LISINOPRIL 20 MG PO TABS
20.0000 mg | ORAL_TABLET | Freq: Every day | ORAL | Status: DC
  Administered 2019-05-26: 12:00:00 20 mg via ORAL
  Filled 2019-05-25: qty 1

## 2019-05-25 MED ORDER — NAPROXEN 500 MG PO TABS
500.0000 mg | ORAL_TABLET | Freq: Once | ORAL | Status: AC
  Administered 2019-05-25: 500 mg via ORAL

## 2019-05-25 MED ORDER — ATORVASTATIN CALCIUM 20 MG PO TABS
40.0000 mg | ORAL_TABLET | Freq: Every day | ORAL | Status: DC
  Administered 2019-05-25: 40 mg via ORAL
  Filled 2019-05-25: qty 2

## 2019-05-25 MED ORDER — LEVOTHYROXINE SODIUM 75 MCG PO TABS
100.0000 ug | ORAL_TABLET | Freq: Every day | ORAL | Status: DC
  Administered 2019-05-26: 100 ug via ORAL
  Filled 2019-05-25: qty 1

## 2019-05-25 MED ORDER — ACETAMINOPHEN 325 MG PO TABS
ORAL_TABLET | ORAL | Status: AC
  Filled 2019-05-25: qty 2

## 2019-05-25 MED ORDER — DIVALPROEX SODIUM ER 500 MG PO TB24
1000.0000 mg | ORAL_TABLET | Freq: Every day | ORAL | Status: DC
  Administered 2019-05-25: 21:00:00 1000 mg via ORAL
  Filled 2019-05-25: qty 2

## 2019-05-25 MED ORDER — INSULIN ASPART PROT & ASPART (70-30 MIX) 100 UNIT/ML ~~LOC~~ SUSP
25.0000 [IU] | Freq: Two times a day (BID) | SUBCUTANEOUS | Status: DC
  Administered 2019-05-25 – 2019-05-26 (×2): 25 [IU] via SUBCUTANEOUS
  Filled 2019-05-25 (×3): qty 10

## 2019-05-25 MED ORDER — MIRTAZAPINE 15 MG PO TABS
15.0000 mg | ORAL_TABLET | Freq: Every day | ORAL | Status: DC
  Administered 2019-05-25: 21:00:00 15 mg via ORAL
  Filled 2019-05-25: qty 1

## 2019-05-25 MED ORDER — TRAZODONE HCL 50 MG PO TABS
50.0000 mg | ORAL_TABLET | Freq: Every day | ORAL | Status: DC
  Administered 2019-05-25: 21:00:00 50 mg via ORAL
  Filled 2019-05-25: qty 1

## 2019-05-25 MED ORDER — IBUPROFEN 600 MG PO TABS
600.0000 mg | ORAL_TABLET | Freq: Four times a day (QID) | ORAL | Status: DC | PRN
  Administered 2019-05-25: 22:00:00 600 mg via ORAL
  Filled 2019-05-25: qty 1

## 2019-05-25 MED ORDER — ASPIRIN EC 81 MG PO TBEC
81.0000 mg | DELAYED_RELEASE_TABLET | Freq: Every day | ORAL | Status: DC
  Administered 2019-05-26: 81 mg via ORAL
  Filled 2019-05-25: qty 1

## 2019-05-25 NOTE — ED Provider Notes (Signed)
[facility] Emergency Department Provider Note  ____________________________________________   First MD Initiated Contact with Patient 05/25/19 1425     (approximate)  I have reviewed the triage vital signs and the nursing notes.  History  Chief Complaint Mental Health Problem    HPI Katrina Lindsey is a 53 y.o. female with a history of diabetes, hypertension who presents with BPD from [facility] under IVC for increased depression, feeling suicidal last night. Admits to relapsing and using crack cocaine after a long period of sobriety.  To clarify triage note: [facility] paper work states last time (not last night) the patient felt this bad she did take pills and jump off a bridge.  No such event occurred recently.          Past Medical Hx Past Medical History:  Diagnosis Date  . Diabetes mellitus without complication (HCC)   . Hypertension     Problem List Patient Active Problem List   Diagnosis Date Noted  . MDD (major depressive disorder), severe (HCC) 05/19/2019  . Adjustment disorder with mixed anxiety and depressed mood 05/19/2019  . Schizoaffective disorder, bipolar type (HCC) 05/19/2019  . Diabetes (HCC) 05/19/2019  . Essential hypertension 05/19/2019  . Hypothyroidism 05/19/2019  . MDD (major depressive disorder), recurrent episode, severe (HCC) 05/18/2019  . Cocaine abuse with cocaine-induced mood disorder (HCC) 01/15/2019    Past Surgical Hx History reviewed. No pertinent surgical history.  Medications Prior to Admission medications   Medication Sig Start Date End Date Taking? Authorizing Provider  aspirin EC 81 MG EC tablet Take 1 tablet (81 mg total) by mouth daily. 05/19/19   Clapacs, Jackquline DenmarkJohn T, MD  atorvastatin (LIPITOR) 40 MG tablet Take 1 tablet (40 mg total) by mouth daily at 6 PM. 05/19/19   Clapacs, Jackquline DenmarkJohn T, MD  divalproex (DEPAKOTE ER) 500 MG 24 hr tablet Take 2 tablets (1,000 mg total) by mouth at bedtime. 05/19/19   Clapacs, Jackquline DenmarkJohn T, MD   insulin aspart protamine- aspart (NOVOLOG MIX 70/30) (70-30) 100 UNIT/ML injection Inject 0.25 mLs (25 Units total) into the skin 2 (two) times daily with a meal. 05/19/19   Clapacs, Jackquline DenmarkJohn T, MD  levothyroxine (SYNTHROID) 100 MCG tablet Take 1 tablet (100 mcg total) by mouth daily at 6 (six) AM. 05/20/19   Clapacs, Jackquline DenmarkJohn T, MD  lisinopril (ZESTRIL) 20 MG tablet Take 1 tablet (20 mg total) by mouth daily. 05/19/19   Clapacs, Jackquline DenmarkJohn T, MD  loratadine (CLARITIN) 10 MG tablet Take 1 tablet (10 mg total) by mouth daily. 05/19/19   Clapacs, Jackquline DenmarkJohn T, MD  mirtazapine (REMERON) 15 MG tablet Take 1 tablet (15 mg total) by mouth at bedtime. 05/19/19   Clapacs, Jackquline DenmarkJohn T, MD  traZODone (DESYREL) 50 MG tablet Take 1 tablet (50 mg total) by mouth at bedtime. 05/19/19   Clapacs, Jackquline DenmarkJohn T, MD  ziprasidone (GEODON) 20 MG capsule Take 5 capsules (100 mg total) by mouth daily with breakfast. 05/20/19   Clapacs, Jackquline DenmarkJohn T, MD    Allergies Patient has no known allergies.  Family Hx No family history on file.  Social Hx Social History   Tobacco Use  . Smoking status: Current Every Day Smoker  . Smokeless tobacco: Never Used  Substance Use Topics  . Alcohol use: Not Currently  . Drug use: Yes    Comment: crack, last use 05/25/2019     Review of Systems  Constitutional: Negative for fever. Negative for chills. Eyes: Negative for visual changes. ENT: Negative for sore throat. Cardiovascular:  Negative for chest pain. Respiratory: Negative for shortness of breath. Gastrointestinal: Negative for abdominal pain. Negative for nausea. Negative for vomiting. Genitourinary: Negative for dysuria. Musculoskeletal: Negative for leg swelling. Skin: Negative for rash. Neurological: Negative for for headaches.   Physical Exam  Vital Signs: ED Triage Vitals  Enc Vitals Group     BP 05/25/19 1403 127/87     Pulse Rate 05/25/19 1403 94     Resp 05/25/19 1403 16     Temp 05/25/19 1403 98.8 F (37.1 C)     Temp Source 05/25/19 1403  Oral     SpO2 05/25/19 1403 100 %     Weight 05/25/19 1350 179 lb 14.3 oz (81.6 kg)     Height 05/25/19 1350 5\' 5"  (1.651 m)     Head Circumference --      Peak Flow --      Pain Score 05/25/19 1350 0     Pain Loc --      Pain Edu? --      Excl. in [place]? --     Constitutional: Alert and oriented. Somewhat disheveled.  Eyes: Conjunctivae clear. Sclera anicteric. Head: Normocephalic. Atraumatic. Nose: No congestion. No rhinorrhea. Mouth/Throat: Mucous membranes are moist.  Neck: No stridor.   Cardiovascular: Normal rate, regular rhythm.  Respiratory: Normal respiratory effort.  Lungs CTAB. Gastrointestinal: Soft and non-tender. No distention.  Musculoskeletal: No lower extremity edema. Neurologic:  Normal speech and language. No gross focal neurologic deficits are appreciated.  Skin: Skin is warm, dry and intact. No rash noted. Psychiatric: Depressed.  EKG  N/A    Radiology  N/A   Procedures  Procedure(s) performed (including critical care):  Procedures   Initial Impression / Assessment and Plan / ED Course  54 y.o. female with a history of diabetes, hypertension who presents with BPD from [place] under IVC for increased depression, feeling suicidal last night. Admits to relapsing and using crack cocaine after a long period of sobriety.  Plan: screening labs, psychiatry, TTS consult  Work-up reveals UDS positive for cocaine, otherwise lab work without actionable derangements.  Patient is medically clear for psych evaluation.  Ordered her home medications, placed diabetic coordinator consult.  Awaiting psychiatric evaluation and recommendations.   Final Clinical Impression(s) / ED Diagnosis  Final diagnoses:  Involuntary commitment  Cocaine use       Note:  This document was prepared using Dragon voice recognition software and may include unintentional dictation errors.   Lilia Pro., MD 05/25/19 [phone]
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
## 2019-05-25 NOTE — Consult Note (Signed)
[facility] Face-to-Face Psychiatry Consult   Reason for Consult: Severe Depression Referring Physician: Dr. Colon Branch Patient Identification: Katrina Lindsey MRN:  [id number] Principal Diagnosis: <principal problem not specified> Diagnosis:  Active Problems:   Cocaine abuse with cocaine-induced mood disorder (HCC)   MDD (major depressive disorder), recurrent episode, severe (HCC)   MDD (major depressive disorder), severe (HCC)   Adjustment disorder with mixed anxiety and depressed mood   Schizoaffective disorder, bipolar type (HCC)   Diabetes (HCC)   Essential hypertension   Hypothyroidism   Total Time spent with patient: 1 hour  Subjective: " I don't know what I am going to do to my self." Katrina Lindsey is a 53 y.o. female patient presented to [facility] ED by way of [facility] via law enforcement under involuntary commitment status (IVC). Per IVC paperwork, the patient has been experiencing increased depression, feeling suicidal last night. The patient admits to relapsing and using crack cocaine after a long period of sobriety. Per [facility] paperwork states that when the patient felt this bad in the past; she took pills and jumped off a bridge.  No such event occurred recently. On the 1st of September, the patient was seen with suicidal ideation, very emotional, and stating, " I have lost two sons, and my other son is diagnosed with stage four cancer."  Tonight's visit, she is agitated because she is here in the hospital. The patient states, "my daughter made me come here because I am using crack cocaine."  "My daughter told me if I keep using crack cocaine, I can't live in her house."  "She told me I need to come to the hospital to get help."  The patient states, "it is my choice to use crack if I want to, and if I do not want to stop, nobody can make me."  She voice, I am not suicidal, homicidal, I am not hearing voices or seeing things that nobody sees."  She continued to state, "I do not want to be here.  I want  to be discharged."  Patient behavior is similar to her last admission.  She was admitted to the inpatient unit and was discharged earlier that next day due Scotty Court to her voicing she did not want to be admitted and wanted to be discharged.  The patient refused to have her nighttime medications reorder.  She states, "I want you to order for me to be discharged from here."  The patient was seen face-to-face by this provider; the chart reviewed and consulted with Dr. Scotty Court on 05/25/2019 due to the patient's care. It was discussed with the EDP that the patient does not meet the criteria to be admitted to the psychiatric inpatient unit.  She would benefit from substance abuse treatment when she is ready to seek care. The patient is alert and oriented x4 on evaluation; she is emotional, upset, uncooperative, and mood-congruent with affect.  The patient does not appear to be responding to internal or external stimuli. Neither is the patient presenting with any delusional thinking. The patient denies auditory or visual hallucinations. The patient denies any suicidal, homicidal, or self-harm ideations. The patient is not presenting with any psychotic or paranoid behaviors. During an encounter with the patient, she was able to answer questions appropriately. Collateral was not obtained from the patient's daughter Delorise Royals [phone]); this provider made several attempts without any answer. This provider could not leave a message due to the voice mail message not personalized. Plan: The patient is not a safety risk to self  or others and does not require psychiatric inpatient admission for stabilization and treatment.  HPI:  Per Dr. Colon Branch: Katrina Lindsey is a 53 y.o. female with a history of diabetes, hypertension who presents with [facility] from [facility] under IVC for increased depression, feeling suicidal last night. Admits to relapsing and using crack cocaine after a long period of sobriety.  To clarify triage  note: [facility] paper work states last time (not last night) the patient felt this bad she did take pills and jump off a bridge.  No such event occurred recently.  Past Psychiatric History:   Risk to Self: Suicidal Ideation: No-Not Currently/Within Last 6 Months Suicidal Intent: No Is patient at risk for suicide?: No Suicidal Plan?: No-Not Currently/Within Last 6 Months Access to Means: No What has been your use of drugs/alcohol within the last 12 months?: Cocaine How many times?: 0 Other Self Harm Risks: Active Drug Use Triggers for Past Attempts: None known Intentional Self Injurious Behavior: NoneYes Risk to Others: Homicidal Ideation: No Thoughts of Harm to Others: No Current Homicidal Intent: No Current Homicidal Plan: No Access to Homicidal Means: No Identified Victim: Reports of none History of harm to others?: No Assessment of Violence: None Noted Violent Behavior Description: Reports of none Does patient have access to weapons?: No Criminal Charges Pending?: No Does patient have a court date: No Prior Inpatient Therapy: Prior Inpatient Therapy: Yes Prior Therapy Dates: 04/2019 & Unable to remember dates Prior Therapy Facilty/Provider(s): Multiple Hospitalizations Reason for Treatment: Schizophrenia & Depression Prior Outpatient Therapy: Prior Outpatient Therapy: Yes Prior Therapy Dates: Current Prior Therapy Facilty/Provider(s): [facility] Reason for Treatment: Schizophrenia & Depression Does patient have an ACCT team?: No Does patient have Intensive In-House Services?  : No Does patient have [facility] services? : No Does patient have P4CC services?: NoYes  Past Medical History:  Past Medical History:  Diagnosis Date  . Diabetes mellitus without complication (HCC)   . Hypertension    History reviewed. No pertinent surgical history. Family History: No family history on file. Family Psychiatric  History:  Social History:  Social History    Substance and Sexual Activity  Alcohol Use Not Currently     Social History   Substance and Sexual Activity  Drug Use Yes   Comment: crack, last use 05/25/2019    Social History   Socioeconomic History  . Marital status: Single    Spouse name: Not on file  . Number of children: Not on file  . Years of education: Not on file  . Highest education level: Not on file  Occupational History  . Not on file  Social Needs  . Financial resource strain: Not on file  . Food insecurity    Worry: Not on file    Inability: Not on file  . Transportation needs    Medical: Not on file    Non-medical: Not on file  Tobacco Use  . Smoking status: Current Every Day Smoker  . Smokeless tobacco: Never Used  Substance and Sexual Activity  . Alcohol use: Not Currently  . Drug use: Yes    Comment: crack, last use 05/25/2019  . Sexual activity: Not on file  Lifestyle  . Physical activity    Days per week: Not on file    Minutes per session: Not on file  . Stress: Not on file  Relationships  . Social Musician on phone: Not on file    Gets together: Not on file  Attends religious service: Not on file    Active member of club or organization: Not on file    Attends meetings of clubs or organizations: Not on file    Relationship status: Not on file  Other Topics Concern  . Not on file  Social History Narrative  . Not on file   Additional Social History:    Allergies:  No Known Allergies  Labs:  Results for orders placed or performed during the hospital encounter of 05/25/19 (from the past 48 hour(s))  Comprehensive metabolic panel     Status: Abnormal   Collection Time: 05/25/19  1:54 PM  Result Value Ref Range   Sodium 141 135 - 145 mmol/L   Potassium 3.1 (L) 3.5 - 5.1 mmol/L   Chloride 106 98 - 111 mmol/L   CO2 25 22 - 32 mmol/L   Glucose, Bld 102 (H) 70 - 99 mg/dL   BUN 13 6 - 20 mg/dL   Creatinine, Ser 1.610.49 0.44 - 1.00 mg/dL   Calcium 9.3 8.9 - 09.610.3 mg/dL    Total Protein 7.2 6.5 - 8.1 g/dL   Albumin 4.1 3.5 - 5.0 g/dL   AST 16 15 - 41 U/L   ALT 13 0 - 44 U/L   Alkaline Phosphatase 73 38 - 126 U/L   Total Bilirubin 0.7 0.3 - 1.2 mg/dL   GFR calc non Af Amer >60 >60 mL/min   GFR calc Af Amer >60 >60 mL/min   Anion gap 10 5 - 15    Comment: Performed at [facility], [address] [place], [place] [address]  Ethanol     Status: None   Collection Time: 05/25/19  1:54 PM  Result Value Ref Range   Alcohol, Ethyl (B) <10 <10 mg/dL    Comment: (NOTE) Lowest detectable limit for serum alcohol is 10 mg/dL. For medical purposes only. Performed at [facility], [address] [place], [place] [address]   Salicylate level     Status: None   Collection Time: 05/25/19  1:54 PM  Result Value Ref Range   Salicylate Lvl <7.0 2.8 - 30.0 mg/dL    Comment: Performed at [facility], [address] [place], [place] [address]  Acetaminophen level     Status: Abnormal   Collection Time: 05/25/19  1:54 PM  Result Value Ref Range   Acetaminophen (Tylenol), Serum <10 (L) 10 - 30 ug/mL    Comment: (NOTE) Therapeutic concentrations vary significantly. A range of 10-30 ug/mL  may be an effective concentration for many patients. However, some  are best treated at concentrations outside of this range. Acetaminophen concentrations >150 ug/mL at 4 hours after ingestion  and >50 ug/mL at 12 hours after ingestion are often associated with  toxic reactions. Performed at [facility], [address] [place], [place] [address]   cbc     Status: None   Collection Time: 05/25/19  1:54 PM  Result Value Ref Range   WBC 7.3 4.0 - 10.5 K/uL   RBC 4.36 3.87 - 5.11 MIL/uL   Hemoglobin 13.0 12.0 - 15.0 g/dL   HCT 30.838.7 65.736.0 - 84.646.0 %   MCV 88.8 80.0 - 100.0 fL   MCH 29.8 26.0 - 34.0 pg   MCHC 33.6 30.0 - 36.0 g/dL   RDW 96.215.4 95.211.5 - 84.115.5 %   Platelets 320 150 - 400 K/uL   nRBC 0.0 0.0 - 0.2 %    Comment: Performed at  [facility], [address] [place], [place] [address]  Urine Drug Screen, Qualitative     Status: Abnormal   Collection Time: 05/25/19  1:55 PM  Result Value Ref Range   Tricyclic, Ur Screen NONE DETECTED NONE DETECTED   Amphetamines, Ur Screen NONE DETECTED NONE DETECTED   MDMA (Ecstasy)Ur Screen NONE DETECTED NONE DETECTED   Cocaine Metabolite,Ur [place] POSITIVE (A) NONE DETECTED   Opiate, Ur Screen NONE DETECTED NONE DETECTED   Phencyclidine (PCP) Ur S NONE DETECTED NONE DETECTED   Cannabinoid 50 Ng, Ur [place] NONE DETECTED NONE DETECTED   Barbiturates, Ur Screen NONE DETECTED NONE DETECTED   Benzodiazepine, Ur Scrn NONE DETECTED NONE DETECTED   Methadone Scn, Ur NONE DETECTED NONE DETECTED    Comment: (NOTE) Tricyclics + metabolites, urine    Cutoff 1000 ng/mL Amphetamines + metabolites, urine  Cutoff 1000 ng/mL MDMA (Ecstasy), urine              Cutoff 500 ng/mL Cocaine Metabolite, urine          Cutoff 300 ng/mL Opiate + metabolites, urine        Cutoff 300 ng/mL Phencyclidine (PCP), urine         Cutoff 25 ng/mL Cannabinoid, urine                 Cutoff 50 ng/mL Barbiturates + metabolites, urine  Cutoff 200 ng/mL Benzodiazepine, urine              Cutoff 200 ng/mL Methadone, urine                   Cutoff 300 ng/mL The urine drug screen provides only a preliminary, unconfirmed analytical test result and should not be used for non-medical purposes. Clinical consideration and professional judgment should be applied to any positive drug screen result due to possible interfering substances. A more specific alternate chemical method must be used in order to obtain a confirmed analytical result. Gas chromatography / mass spectrometry (GC/MS) is the preferred confirmat ory method. Performed at [facility], [address] [place], [place] [address]   Pregnancy, urine POC     Status: None   Collection Time: 05/25/19  3:03 PM  Result Value Ref Range   Preg Test,  Ur NEGATIVE NEGATIVE    Comment:        THE SENSITIVITY OF THIS METHODOLOGY IS >24 mIU/mL   Glucose, capillary     Status: Abnormal   Collection Time: 05/25/19  6:30 PM  Result Value Ref Range   Glucose-Capillary 265 (H) 70 - 99 mg/dL    Current Facility-Administered Medications  Medication Dose Route Frequency Provider Last Rate Last Dose  . acetaminophen (TYLENOL) 325 MG tablet           . [START ON 05/26/2019] aspirin EC tablet 81 mg  81 mg Oral Daily Miguel AschoffMonks, Sarah L., MD      . atorvastatin (LIPITOR) tablet 40 mg  40 mg Oral q1800 Miguel AschoffMonks, Sarah L., MD   40 mg at 05/25/19 1736  . divalproex (DEPAKOTE ER) 24 hr tablet 1,000 mg  1,000 mg Oral QHS Miguel AschoffMonks, Sarah L., MD   1,000 mg at 05/25/19 2123  . ibuprofen (ADVIL) tablet 600 mg  600 mg Oral Q6H PRN Gillermo Murdochhompson, , NP   600 mg at 05/25/19 2204  . insulin aspart protamine- aspart (NOVOLOG MIX 70/30) injection 25 Units  25 Units Subcutaneous BID WC Miguel AschoffMonks, Sarah L., MD   25 Units at 05/25/19 1835  . [START  ON 05/26/2019] levothyroxine (SYNTHROID) tablet 100 mcg  100 mcg Oral Q0600 Lilia Pro., MD      . Derrill Memo ON 05/26/2019] lisinopril (ZESTRIL) tablet 20 mg  20 mg Oral Daily Lilia Pro., MD      . Derrill Memo ON 05/26/2019] loratadine (CLARITIN) tablet 10 mg  10 mg Oral Daily Lilia Pro., MD      . mirtazapine (REMERON) tablet 15 mg  15 mg Oral QHS Lilia Pro., MD   15 mg at 05/25/19 2124  . traZODone (DESYREL) tablet 50 mg  50 mg Oral QHS Lilia Pro., MD   50 mg at 05/25/19 2124  . [START ON 05/26/2019] ziprasidone (GEODON) capsule 100 mg  100 mg Oral Q breakfast Lilia Pro., MD       Current Outpatient Medications  Medication Sig Dispense Refill  . aspirin EC 81 MG EC tablet Take 1 tablet (81 mg total) by mouth daily. 30 tablet 0  . atorvastatin (LIPITOR) 40 MG tablet Take 1 tablet (40 mg total) by mouth daily at 6 PM. 30 tablet 0  . divalproex (DEPAKOTE ER) 500 MG 24 hr tablet Take 2 tablets (1,000 mg total) by mouth at  bedtime. 60 tablet 0  . insulin aspart protamine- aspart (NOVOLOG MIX 70/30) (70-30) 100 UNIT/ML injection Inject 0.25 mLs (25 Units total) into the skin 2 (two) times daily with a meal. 10 mL 11  . levothyroxine (SYNTHROID) 100 MCG tablet Take 1 tablet (100 mcg total) by mouth daily at 6 (six) AM. 30 tablet 0  . lisinopril (ZESTRIL) 20 MG tablet Take 1 tablet (20 mg total) by mouth daily. 30 tablet 0  . loratadine (CLARITIN) 10 MG tablet Take 1 tablet (10 mg total) by mouth daily. 30 tablet 0  . mirtazapine (REMERON) 15 MG tablet Take 1 tablet (15 mg total) by mouth at bedtime. 30 tablet 0  . traZODone (DESYREL) 50 MG tablet Take 1 tablet (50 mg total) by mouth at bedtime. 30 tablet 0  . ziprasidone (GEODON) 20 MG capsule Take 5 capsules (100 mg total) by mouth daily with breakfast. 150 capsule 0    Musculoskeletal: Strength & Muscle Tone: within normal limits Gait & Station: normal Patient leans: N/A  Psychiatric Specialty Exam: Physical Exam  Nursing note and vitals reviewed. Constitutional: She is oriented to person, place, and time. She appears well-developed and well-nourished.  HENT:  Head: Normocephalic.  Eyes: Pupils are equal, round, and reactive to light.  Neck: Normal range of motion. Neck supple.  Cardiovascular: Normal rate.  Respiratory: Effort normal.  Musculoskeletal: Normal range of motion.  Neurological: She is alert and oriented to person, place, and time.  Skin: Skin is warm and dry.    Review of Systems  Psychiatric/Behavioral: Positive for depression and substance abuse. The patient is nervous/anxious.   All other systems reviewed and are negative.   Blood pressure 127/87, pulse 94, temperature 98.8 F (37.1 C), temperature source Oral, resp. rate 16, height 5\' 5"  (1.651 m), weight 81.6 kg, SpO2 100 %.Body mass index is 29.94 kg/m.  General Appearance: Disheveled  Eye Contact:  Fair  Speech:  Clear and Coherent  Volume:  Decreased  Mood:  Anxious,  Depressed and Irritable  Affect:  Congruent, Depressed, Flat and Tearful  Thought Process:  Coherent  Orientation:  Full (Time, Place, and Person)  Thought Content:  Logical  Suicidal Thoughts:  No  Homicidal Thoughts:  No  Memory:  Immediate;   Good Recent;  Good Remote;   Good  Judgement:  Fair  Insight:  Fair  Psychomotor Activity:  Normal  Concentration:  Concentration: Fair  Recall:  Good  Fund of Knowledge:  Good  Language:  Good  Akathisia:  Negative  Handed:  Right  AIMS (if indicated):     Assets:  Desire for Improvement Housing Physical Health Social Support  ADL's:  Intact  Cognition:  WNL  Sleep:   Okay     Treatment Plan Summary: Daily contact with patient to assess and evaluate symptoms and progress in treatment, Medication management and Plan The patient does not meet criteria for psychiatric inpatient admission.  Disposition: No evidence of imminent risk to self or others at present.   Patient does not meet criteria for psychiatric inpatient admission. Supportive therapy provided about ongoing stressors.  Gillermo Murdoch, NP 05/25/2019 11:34 PM

## 2019-05-25 NOTE — ED Notes (Signed)
Pt given meal tray.

## 2019-05-25 NOTE — ED Notes (Signed)
Pt has some warts on her right thumb that she wants dressed. This RN placed xeroform gauze and tape on them. Pt also has a healed scrape on her left forearm. Dressed the same per pt request.

## 2019-05-25 NOTE — ED Notes (Signed)
IVC PENDING  CONSULT ?

## 2019-05-25 NOTE — ED Notes (Signed)
Pt reports that she is here "for depression and drugs because my daughter wanted me to". Pt has thought about killing herself recently but does not have a plan. Pt endorses crack use this morning. Pt is calm and cooperative at this time.

## 2019-05-25 NOTE — ED Notes (Signed)
Pt yelling and screaming is Katrina Lindsey not to come into [facility]. Pt transported into her RM, pt yelling and states " I don't want to be bothered"

## 2019-05-25 NOTE — ED Triage Notes (Signed)
Arrives with BPD from [place] under IVC paperwork for ED evaluation.  Per [facility] chart, patient has felt depressed and began to feel suicidal last night.  Last night patient took pills and jumped off a bridge and reported to [place] she broke her ankle.  Patient reported feeling depressed as she had relapsed on crack cocaine a month ago after being sober for 14 years.  Patient AAOx3. Ambulates with easy and steady gait.  No obvious injury to ankles.  Patient denies current SI/ HI.  Is resistant to answering questions in Triage.

## 2019-05-25 NOTE — ED Notes (Signed)
Hourly rounding reveals patient in room. Stable, in no acute distress. Q15 minute rounds and monitoring via Security Cameras to continue. 

## 2019-05-25 NOTE — ED Notes (Addendum)
Patient Belongings placed in belongings bag.  Black cell phone Box of green newports with 13 cigarettes Brown short sleeve shirt Pair of flip flops with silver top Grey shorts khacki pants Black mask Silver metal watch

## 2019-05-25 NOTE — ED Notes (Signed)
Hourly rounding reveals patient sleeping in room. No complaints, stable, in no acute distress. Q15 minute rounds and monitoring via Security Cameras to continue. 

## 2019-05-25 NOTE — ED Notes (Signed)
Pt requests for big toe on left foot to be wrapped. Pt has very small, not red, not warm, not draining wound under big toe. Gauze and tape applied.

## 2019-05-25 NOTE — ED Notes (Signed)
IVC  PAPERS  RESCINDED PT  VOL NOW

## 2019-05-25 NOTE — ED Notes (Signed)
Hourly rounding reveals patient in room. No complaints, stable, in no acute distress. Q15 minute rounds and monitoring via Security Cameras to continue. 

## 2019-05-25 NOTE — ED Notes (Signed)
Report to include Situation, Background, Assessment, and Recommendations received from [place] RN. Patient alert, irritable and oriented, warm and dry, in no acute distress. Patient denies SI, HI, AVH and pain. Patient made aware of Q15 minute rounds and security cameras for their safety. Patient instructed to come to me with needs or concerns.

## 2019-05-26 DIAGNOSIS — F1414 Cocaine abuse with cocaine-induced mood disorder: Secondary | ICD-10-CM

## 2019-05-26 LAB — GLUCOSE, CAPILLARY: Glucose-Capillary: 297 mg/dL — ABNORMAL HIGH (ref 70–99)

## 2019-05-26 MED ORDER — HYDROXYZINE HCL 25 MG PO TABS
25.0000 mg | ORAL_TABLET | Freq: Once | ORAL | Status: AC
  Administered 2019-05-26: 12:00:00 25 mg via ORAL
  Filled 2019-05-26: qty 1

## 2019-05-26 NOTE — Consult Note (Signed)
[facility] Face-to-Face Psychiatry Consult   Reason for Consult:  Suicidal ideations Referring Physician:  EDP Patient Identification: Katrina Lindsey MRN:  [id number] Principal Diagnosis: Cocaine abuse with cocaine-induced mood disorder (HCC) Diagnosis:  Principal Problem:   Cocaine abuse with cocaine-induced mood disorder (HCC) Active Problems:   MDD (major depressive disorder), recurrent episode, severe (HCC)   MDD (major depressive disorder), severe (HCC)   Adjustment disorder with mixed anxiety and depressed mood   Schizoaffective disorder, bipolar type (HCC)   Diabetes (HCC)   Essential hypertension   Hypothyroidism   Total Time spent with patient: 30 minutes  Subjective:   Katrina Lindsey is a 53 y.o. female patient reports that she is ready to go today.  She states that she was brought to the hospital because her daughter to come here to get help with her cocaine abuse.  She reports that she has been sober for quite some time and then recently relapsed and her daughter became upset.  She states that she has been living with her daughter and her daughter told her she cannot stay there anymore unless she got help for her drug abuse.  She denies any suicidal or homicidal ideations and denies any hallucinations.  She states that she is interested in going to get some assistance for her substance abuse and is requesting resources.  She states that she does not feel that she needs a residential treatment for this.  She gives verbal permission to contact her daughter Katrina Lindsey for collateral information.  Patient's daughter, Katrina Lindsey, was contacted for collateral information.  She confirms that she did send the patient to the hospital for assistance with her crack cocaine use.  She reports that the patient is lying to [place] about how much she has been using.  She states that she uses whenever she can afford to buy it and she has been giving her Torez Beauregard to help her with her expenses but the patient has  been using the Nelma Phagan to buy drugs with.  She states that she is no longer going to do this and wants her to get clean.  She states that she has no safety concerns with her mother being discharged except for her drug use and that there are some people in [place] that are looking for her because of her drug habit.  HPI:  Per Dr. Colon Branch: 53 y.o. female with a history of diabetes, hypertension who presents with BPD from [facility] under IVC for increased depression, feeling suicidal last night. Admits to relapsing and using crack cocaine after a long period of sobriety. To clarify triage note: [facility] paper work states last time (not last night) the patient felt this bad she did take pills and jump off a bridge.  No such event occurred recently.  Patient is seen by this provider face-to-face.  Patient has continued to deny any suicidal homicidal ideations and denies any hallucinations.  Information has been confirmed by the patient's daughter who reported that she did send the patient to the hospital to get assistance with her crack cocaine use.  There was no concerns for safety and there is been no other reports of any suicidal or homicidal ideations.  At this time the patient does not meet inpatient criteria and is psychiatrically cleared.  TTS has provided the patient with resources for substance abuse.  I have rescinded the patient's IVC.  I have notified Dr. Mayford Knife of the recommendations and the plan.  Past Psychiatric History: Adjustment disorder, schizoaffective disorder bipolar type, MDD severe recurrent,  cocaine abuse  Risk to Self: Suicidal Ideation: No-Not Currently/Within Last 6 Months Suicidal Intent: No Is patient at risk for suicide?: No Suicidal Plan?: No-Not Currently/Within Last 6 Months Access to Means: No What has been your use of drugs/alcohol within the last 12 months?: Cocaine How many times?: 0 Other Self Harm Risks: Active Drug Use Triggers for Past Attempts: None known Intentional Self  Injurious Behavior: None Risk to Others: Homicidal Ideation: No Thoughts of Harm to Others: No Current Homicidal Intent: No Current Homicidal Plan: No Access to Homicidal Means: No Identified Victim: Reports of none History of harm to others?: No Assessment of Violence: None Noted Violent Behavior Description: Reports of none Does patient have access to weapons?: No Criminal Charges Pending?: No Does patient have a court date: No Prior Inpatient Therapy: Prior Inpatient Therapy: Yes Prior Therapy Dates: 04/2019 & Unable to remember dates Prior Therapy Facilty/Provider(s): Multiple Hospitalizations Reason for Treatment: Schizophrenia & Depression Prior Outpatient Therapy: Prior Outpatient Therapy: Yes Prior Therapy Dates: Current Prior Therapy Facilty/Provider(s): [facility] Reason for Treatment: Schizophrenia & Depression Does patient have an ACCT team?: No Does patient have Intensive In-House Services?  : No Does patient have [facility] services? : No Does patient have P4CC services?: No  Past Medical History:  Past Medical History:  Diagnosis Date  . Diabetes mellitus without complication (HCC)   . Hypertension    History reviewed. No pertinent surgical history. Family History: No family history on file. Family Psychiatric  History: None reported Social History:  Social History   Substance and Sexual Activity  Alcohol Use Not Currently     Social History   Substance and Sexual Activity  Drug Use Yes   Comment: crack, last use 05/25/2019    Social History   Socioeconomic History  . Marital status: Single    Spouse name: Not on file  . Number of children: Not on file  . Years of education: Not on file  . Highest education level: Not on file  Occupational History  . Not on file  Social Needs  . Financial resource strain: Not on file  . Food insecurity    Worry: Not on file    Inability: Not on file  . Transportation needs    Medical: Not on  file    Non-medical: Not on file  Tobacco Use  . Smoking status: Current Every Day Smoker  . Smokeless tobacco: Never Used  Substance and Sexual Activity  . Alcohol use: Not Currently  . Drug use: Yes    Comment: crack, last use 05/25/2019  . Sexual activity: Not on file  Lifestyle  . Physical activity    Days per week: Not on file    Minutes per session: Not on file  . Stress: Not on file  Relationships  . Social Musician on phone: Not on file    Gets together: Not on file    Attends religious service: Not on file    Active member of club or organization: Not on file    Attends meetings of clubs or organizations: Not on file    Relationship status: Not on file  Other Topics Concern  . Not on file  Social History Narrative  . Not on file   Additional Social History:    Allergies:  No Known Allergies  Labs:  Results for orders placed or performed during the hospital encounter of 05/25/19 (from the past 48 hour(s))  Comprehensive metabolic panel  Status: Abnormal   Collection Time: 05/25/19  1:54 PM  Result Value Ref Range   Sodium 141 135 - 145 mmol/L   Potassium 3.1 (L) 3.5 - 5.1 mmol/L   Chloride 106 98 - 111 mmol/L   CO2 25 22 - 32 mmol/L   Glucose, Bld 102 (H) 70 - 99 mg/dL   BUN 13 6 - 20 mg/dL   Creatinine, Ser 5.620.49 0.44 - 1.00 mg/dL   Calcium 9.3 8.9 - 13.010.3 mg/dL   Total Protein 7.2 6.5 - 8.1 g/dL   Albumin 4.1 3.5 - 5.0 g/dL   AST 16 15 - 41 U/L   ALT 13 0 - 44 U/L   Alkaline Phosphatase 73 38 - 126 U/L   Total Bilirubin 0.7 0.3 - 1.2 mg/dL   GFR calc non Af Amer >60 >60 mL/min   GFR calc Af Amer >60 >60 mL/min   Anion gap 10 5 - 15    Comment: Performed at [facility], [address] [place], [place] [address]  Ethanol     Status: None   Collection Time: 05/25/19  1:54 PM  Result Value Ref Range   Alcohol, Ethyl (B) <10 <10 mg/dL    Comment: (NOTE) Lowest detectable limit for serum alcohol is 10 mg/dL. For medical purposes  only. Performed at [facility], [address] [place], [place] [address]   Salicylate level     Status: None   Collection Time: 05/25/19  1:54 PM  Result Value Ref Range   Salicylate Lvl <7.0 2.8 - 30.0 mg/dL    Comment: Performed at [facility], [address] [place], [place] [address]  Acetaminophen level     Status: Abnormal   Collection Time: 05/25/19  1:54 PM  Result Value Ref Range   Acetaminophen (Tylenol), Serum <10 (L) 10 - 30 ug/mL    Comment: (NOTE) Therapeutic concentrations vary significantly. A range of 10-30 ug/mL  may be an effective concentration for many patients. However, some  are best treated at concentrations outside of this range. Acetaminophen concentrations >150 ug/mL at 4 hours after ingestion  and >50 ug/mL at 12 hours after ingestion are often associated with  toxic reactions. Performed at [facility], [address] [place], [place] [address]   cbc     Status: None   Collection Time: 05/25/19  1:54 PM  Result Value Ref Range   WBC 7.3 4.0 - 10.5 K/uL   RBC 4.36 3.87 - 5.11 MIL/uL   Hemoglobin 13.0 12.0 - 15.0 g/dL   HCT 01.038.7 27.236.0 - 53.646.0 %   MCV 88.8 80.0 - 100.0 fL   MCH 29.8 26.0 - 34.0 pg   MCHC 33.6 30.0 - 36.0 g/dL   RDW 64.415.4 03.411.5 - 74.215.5 %   Platelets 320 150 - 400 K/uL   nRBC 0.0 0.0 - 0.2 %    Comment: Performed at [facility], [address] [place], [place] [address]  Urine Drug Screen, Qualitative     Status: Abnormal   Collection Time: 05/25/19  1:55 PM  Result Value Ref Range   Tricyclic, Ur Screen NONE DETECTED NONE DETECTED   Amphetamines, Ur Screen NONE DETECTED NONE DETECTED   MDMA (Ecstasy)Ur Screen NONE DETECTED NONE DETECTED   Cocaine Metabolite,Ur [place] POSITIVE (A) NONE DETECTED   Opiate, Ur Screen NONE DETECTED NONE DETECTED   Phencyclidine (PCP) Ur S NONE DETECTED NONE DETECTED   Cannabinoid 50 Ng, Ur [place] NONE DETECTED NONE DETECTED  Barbiturates, Ur Screen NONE DETECTED  NONE DETECTED   Benzodiazepine, Ur Scrn NONE DETECTED NONE DETECTED   Methadone Scn, Ur NONE DETECTED NONE DETECTED    Comment: (NOTE) Tricyclics + metabolites, urine    Cutoff 1000 ng/mL Amphetamines + metabolites, urine  Cutoff 1000 ng/mL MDMA (Ecstasy), urine              Cutoff 500 ng/mL Cocaine Metabolite, urine          Cutoff 300 ng/mL Opiate + metabolites, urine        Cutoff 300 ng/mL Phencyclidine (PCP), urine         Cutoff 25 ng/mL Cannabinoid, urine                 Cutoff 50 ng/mL Barbiturates + metabolites, urine  Cutoff 200 ng/mL Benzodiazepine, urine              Cutoff 200 ng/mL Methadone, urine                   Cutoff 300 ng/mL The urine drug screen provides only a preliminary, unconfirmed analytical test result and should not be used for non-medical purposes. Clinical consideration and professional judgment should be applied to any positive drug screen result due to possible interfering substances. A more specific alternate chemical method must be used in order to obtain a confirmed analytical result. Gas chromatography / mass spectrometry (GC/MS) is the preferred confirmat ory method. Performed at [facility], [address] [place], [place] [address]   Pregnancy, urine POC     Status: None   Collection Time: 05/25/19  3:03 PM  Result Value Ref Range   Preg Test, Ur NEGATIVE NEGATIVE    Comment:        THE SENSITIVITY OF THIS METHODOLOGY IS >24 mIU/mL   Glucose, capillary     Status: Abnormal   Collection Time: 05/25/19  6:30 PM  Result Value Ref Range   Glucose-Capillary 265 (H) 70 - 99 mg/dL    Current Facility-Administered Medications  Medication Dose Route Frequency Provider Last Rate Last Dose  . aspirin EC tablet 81 mg  81 mg Oral Daily Caroline Sauger, NP   81 mg at 05/26/19 1137  . atorvastatin (LIPITOR) tablet 40 mg  40 mg Oral q1800 Caroline Sauger, NP   40 mg at 05/25/19 1736  . divalproex (DEPAKOTE ER) 24 hr tablet  1,000 mg  1,000 mg Oral QHS Caroline Sauger, NP   1,000 mg at 05/25/19 2123  . insulin aspart protamine- aspart (NOVOLOG MIX 70/30) injection 25 Units  25 Units Subcutaneous BID WC Caroline Sauger, NP   25 Units at 05/26/19 1000  . levothyroxine (SYNTHROID) tablet 100 mcg  100 mcg Oral Q0600 Caroline Sauger, NP   100 mcg at 05/26/19 7902  . lisinopril (ZESTRIL) tablet 20 mg  20 mg Oral Daily Caroline Sauger, NP   20 mg at 05/26/19 1137  . loratadine (CLARITIN) tablet 10 mg  10 mg Oral Daily Caroline Sauger, NP   10 mg at 05/26/19 1137  . mirtazapine (REMERON) tablet 15 mg  15 mg Oral QHS Caroline Sauger, NP   15 mg at 05/25/19 2124  . traZODone (DESYREL) tablet 50 mg  50 mg Oral QHS Caroline Sauger, NP   50 mg at 05/25/19 2124  . ziprasidone (GEODON) capsule 100 mg  100 mg Oral Q breakfast Caroline Sauger, NP   100 mg at 05/26/19 1137   Current Outpatient Medications  Medication Sig Dispense Refill  .  aspirin EC 81 MG EC tablet Take 1 tablet (81 mg total) by mouth daily. 30 tablet 0  . atorvastatin (LIPITOR) 40 MG tablet Take 1 tablet (40 mg total) by mouth daily at 6 PM. 30 tablet 0  . divalproex (DEPAKOTE ER) 500 MG 24 hr tablet Take 2 tablets (1,000 mg total) by mouth at bedtime. 60 tablet 0  . insulin aspart protamine- aspart (NOVOLOG MIX 70/30) (70-30) 100 UNIT/ML injection Inject 0.25 mLs (25 Units total) into the skin 2 (two) times daily with a meal. 10 mL 11  . levothyroxine (SYNTHROID) 100 MCG tablet Take 1 tablet (100 mcg total) by mouth daily at 6 (six) AM. 30 tablet 0  . lisinopril (ZESTRIL) 20 MG tablet Take 1 tablet (20 mg total) by mouth daily. 30 tablet 0  . loratadine (CLARITIN) 10 MG tablet Take 1 tablet (10 mg total) by mouth daily. 30 tablet 0  . mirtazapine (REMERON) 15 MG tablet Take 1 tablet (15 mg total) by mouth at bedtime. 30 tablet 0  . traZODone (DESYREL) 50 MG tablet Take 1 tablet (50 mg total) by mouth at bedtime. 30 tablet 0   . ziprasidone (GEODON) 20 MG capsule Take 5 capsules (100 mg total) by mouth daily with breakfast. 150 capsule 0    Musculoskeletal: Strength & Muscle Tone: within normal limits Gait & Station: normal Patient leans: N/A  Psychiatric Specialty Exam: Physical Exam  Nursing note and vitals reviewed. Constitutional: She is oriented to person, place, and time. She appears well-developed and well-nourished.  Cardiovascular: Normal rate.  Respiratory: Effort normal.  Musculoskeletal: Normal range of motion.  Neurological: She is alert and oriented to person, place, and time.    Review of Systems  Constitutional: Negative.   HENT: Negative.   Eyes: Negative.   Respiratory: Negative.   Cardiovascular: Negative.   Gastrointestinal: Negative.   Genitourinary: Negative.   Musculoskeletal: Negative.   Skin: Negative.   Neurological: Negative.   Endo/Heme/Allergies: Negative.   Psychiatric/Behavioral: Positive for substance abuse. Negative for suicidal ideas.    Blood pressure 127/87, pulse 94, temperature 98.8 F (37.1 C), temperature source Oral, resp. rate 16, height 5\' 5"  (1.651 m), weight 81.6 kg, SpO2 100 %.Body mass index is 29.94 kg/m.  General Appearance: Casual  Eye Contact:  Good  Speech:  Clear and Coherent and Normal Rate  Volume:  Increased  Mood:  Euthymic  Affect:  Congruent  Thought Process:  Coherent and Descriptions of Associations: Intact  Orientation:  Full (Time, Place, and Person)  Thought Content:  WDL  Suicidal Thoughts:  No  Homicidal Thoughts:  No  Memory:  Immediate;   Good Recent;   Good Remote;   Good  Judgement:  Fair  Insight:  Fair  Psychomotor Activity:  Normal  Concentration:  Concentration: Good  Recall:  Good  Fund of Knowledge:  Good  Language:  Good  Akathisia:  No  Handed:  Right  AIMS (if indicated):     Assets:  Communication Skills Desire for Improvement Housing Resilience Social Support Transportation  ADL's:  Intact   Cognition:  WNL  Sleep:        Treatment Plan Summary: Follow up with outpatient provider  Follow up with outpatient substance abuse treatment  Disposition: No evidence of imminent risk to self or others at present.   Patient does not meet criteria for psychiatric inpatient admission. Supportive therapy provided about ongoing stressors. Discussed crisis plan, support from social network, calling 911, coming to the Emergency Department, and  calling Suicide Hotline.  Gerlene Burdock Kaydie Petsch, FNP 05/26/2019 12:01 PM

## 2019-05-26 NOTE — ED Notes (Signed)
Hourly rounding reveals patient sleeping in room. No complaints, stable, in no acute distress. Q15 minute rounds and monitoring via Security Cameras to continue. 

## 2019-05-26 NOTE — ED Provider Notes (Signed)
-----------------------------------------   4:28 AM on 05/26/2019 -----------------------------------------   Blood pressure 127/87, pulse 94, temperature 98.8 F (37.1 C), temperature source Oral, resp. rate 16, height 1.651 m (5\' 5" ), weight 81.6 kg, SpO2 100 %.  The patient is calm and cooperative at this time.  There have been no acute events since the last update.  Awaiting disposition plan from Behavioral Medicine and/or Social Work team(s).   Hinda Kehr, MD 05/26/19 [phone]

## 2019-05-26 NOTE — ED Notes (Signed)
Patient discharged home with daughter, patient received discharge papers. Patient received belongings and verbalized she has received all of her belongings. Patient appropriate and cooperative, Denies SI/HI AVH. Vital signs taken. NAD noted.

## 2019-05-26 NOTE — ED Notes (Signed)
IVC/Consult completed/Pending Dispostion 

## 2019-05-26 NOTE — Progress Notes (Signed)
Inpatient Diabetes Program Recommendations  AACE/ADA: New Consensus Statement on Inpatient Glycemic Control (2015)  Target Ranges:  Prepandial:   less than 140 mg/dL      Peak postprandial:   less than 180 mg/dL (1-2 hours)      Critically ill patients:  140 - 180 mg/dL   Lab Results  Component Value Date   GLUCAP 265 (H) 05/25/2019    Review of Glycemic Control  Diabetes history: DM Outpatient Diabetes medications: Novolog insulin mix 70/30 25 units bid Current orders for Inpatient glycemic control: Novolog 70/30 25 units bid  Inpatient Diabetes Program Recommendations:   Add glycemic control order set Novolog sensitive correction tid + hs 0-5 units  Thank you, Bethena Roys E. Keijuan Schellhase, RN, MSN, CDE  Diabetes Coordinator Inpatient Glycemic Control Team Team Pager [phone] (8am-5pm) 05/26/2019 11:55 AM

## 2019-05-26 NOTE — BH Assessment (Signed)
Writer spoke with patient an provided her with outpatient treatment options. Patient currently received treatment with [facility].  Patient denies SI/HI and AV/H.

## 2019-05-26 NOTE — ED Provider Notes (Signed)
Patient has been cleared by psychiatry for discharge.   Earleen [place], MD 05/26/19 1213

## 2019-06-10 ENCOUNTER — Other Ambulatory Visit: Payer: Self-pay

## 2019-06-10 ENCOUNTER — Emergency Department
Admission: EM | Admit: 2019-06-10 | Discharge: 2019-06-10 | Disposition: A | Discharge status: Home / Self Care | Payer: Self-pay | Attending: Emergency Medicine | Admitting: Emergency Medicine

## 2019-06-10 ENCOUNTER — Encounter: Payer: Self-pay | Admitting: Emergency Medicine

## 2019-06-10 DIAGNOSIS — E119 Type 2 diabetes mellitus without complications: Secondary | ICD-10-CM | POA: Insufficient documentation

## 2019-06-10 DIAGNOSIS — I10 Essential (primary) hypertension: Secondary | ICD-10-CM | POA: Insufficient documentation

## 2019-06-10 DIAGNOSIS — Z79899 Other long term (current) drug therapy: Secondary | ICD-10-CM | POA: Insufficient documentation

## 2019-06-10 DIAGNOSIS — Z794 Long term (current) use of insulin: Secondary | ICD-10-CM | POA: Insufficient documentation

## 2019-06-10 DIAGNOSIS — F17210 Nicotine dependence, cigarettes, uncomplicated: Secondary | ICD-10-CM | POA: Insufficient documentation

## 2019-06-10 DIAGNOSIS — E039 Hypothyroidism, unspecified: Secondary | ICD-10-CM | POA: Insufficient documentation

## 2019-06-10 DIAGNOSIS — F331 Major depressive disorder, recurrent, moderate: Secondary | ICD-10-CM | POA: Insufficient documentation

## 2019-06-10 NOTE — ED Notes (Signed)
Pt is easily agitated during triage, appears irritated with any questions asked.

## 2019-06-10 NOTE — ED Notes (Signed)
Pt dressed out into burgundy scrubs per this RN and EDT, Kayla.  Belongings placed in labeled belonging bag.  Belongings include:  Recruitment consultant Tshirt Bra Black Pants [employer]

## 2019-06-10 NOTE — ED Notes (Signed)
Vol.. 

## 2019-06-10 NOTE — ED Notes (Signed)
Patient is stable in NAD. She is discharged to home via cab. Discharge instruction and follow up reviewed. Patient verbalized understanding. No issues.

## 2019-06-10 NOTE — ED Notes (Signed)
Patient refused to get blood draw. She said " they missed twice. I am not going to do it again". This writer try to explain to patient that blood need to be drwn for medical clearance. Patient said " I don't care".

## 2019-06-10 NOTE — ED Notes (Signed)
Patient calm but uncooperative. She refused to answer any questions. She asked for food tray. Triage attempted to get blood draw without success. Contacted the lab to come draw the blood.

## 2019-06-10 NOTE — ED Triage Notes (Addendum)
Pt in via BPD voluntarily, states "I'm depressed, sick."  Pt is not forth coming with much information; does report SI/HI without any plan, states, "I do know I want to hurt somebody else."  Pt found to be yelling, cussing on the phone prior to triage.  NAD noted at this time.

## 2019-06-10 NOTE — Discharge Instructions (Addendum)
Continue all medicines as directed by your doctor.  Return to the ER for worsening symptoms, feelings of hurting yourself or others, or other concerns. °

## 2019-06-10 NOTE — ED Provider Notes (Signed)
[facility] Emergency Department Provider Note   ____________________________________________   First MD Initiated Contact with Patient 06/10/19 2308     (approximate)  I have reviewed the triage vital signs and the nursing notes.   HISTORY  Chief Complaint Depression    HPI Katrina Lindsey is a 53 y.o. female brought to the ED via BPD voluntarily with a chief complaint of depression.  Patient is uncooperative and not forthcoming with information.  Reports vague SI/HI without plan.  Reports mouth pain.  Declines to participate further in interview.       Past Medical History:  Diagnosis Date  . Diabetes mellitus without complication (HCC)   . Hypertension     Patient Active Problem List   Diagnosis Date Noted  . MDD (major depressive disorder), severe (HCC) 05/19/2019  . Adjustment disorder with mixed anxiety and depressed mood 05/19/2019  . Schizoaffective disorder, bipolar type (HCC) 05/19/2019  . Diabetes (HCC) 05/19/2019  . Essential hypertension 05/19/2019  . Hypothyroidism 05/19/2019  . MDD (major depressive disorder), recurrent episode, severe (HCC) 05/18/2019  . Cocaine abuse with cocaine-induced mood disorder (HCC) 01/15/2019    History reviewed. No pertinent surgical history.  Prior to Admission medications   Medication Sig Start Date End Date Taking? Authorizing Provider  aspirin EC 81 MG EC tablet Take 1 tablet (81 mg total) by mouth daily. 05/19/19  Yes Clapacs, Jackquline DenmarkJohn T, MD  atorvastatin (LIPITOR) 40 MG tablet Take 1 tablet (40 mg total) by mouth daily at 6 PM. 05/19/19  Yes Clapacs, Jackquline DenmarkJohn T, MD  divalproex (DEPAKOTE ER) 500 MG 24 hr tablet Take 2 tablets (1,000 mg total) by mouth at bedtime. 05/19/19  Yes Clapacs, Jackquline DenmarkJohn T, MD  HUMULIN 70/30 KWIKPEN (70-30) 100 UNIT/ML PEN INJECT 25 UNITS UNDER THE SKIN BEFORE BREAKFAST AND 25 UNITS NEFORE DINNER 05/25/19  Yes [provider]  levothyroxine (SYNTHROID) 100 MCG tablet Take 1 tablet  (100 mcg total) by mouth daily at 6 (six) AM. 05/20/19  Yes Clapacs, Jackquline DenmarkJohn T, MD  lisinopril (ZESTRIL) 20 MG tablet Take 1 tablet (20 mg total) by mouth daily. 05/19/19  Yes Clapacs, Jackquline DenmarkJohn T, MD  loratadine (CLARITIN) 10 MG tablet Take 1 tablet (10 mg total) by mouth daily. 05/19/19  Yes Clapacs, Jackquline DenmarkJohn T, MD  metFORMIN (GLUCOPHAGE) 1000 MG tablet Take 1,000 mg by mouth 2 (two) times daily with a meal. 05/25/19  Yes [provider]  mirtazapine (REMERON) 15 MG tablet Take 1 tablet (15 mg total) by mouth at bedtime. 05/19/19  Yes Clapacs, Jackquline DenmarkJohn T, MD  naproxen (NAPROSYN) 500 MG tablet Take 500 mg by mouth 2 (two) times daily as needed. 05/25/19  Yes [provider]  PROVENTIL HFA 108 (90 Base) MCG/ACT inhaler INHALE 2 PUFFS BY MOUTH EVERY 6 HOURS AS NEEDED FOR COUGHING, WHEEZING, OR SHORTNESS OF BREATH 05/25/19  Yes [provider]  traZODone (DESYREL) 50 MG tablet Take 1 tablet (50 mg total) by mouth at bedtime. 05/19/19  Yes Clapacs, Jackquline DenmarkJohn T, MD  ziprasidone (GEODON) 20 MG capsule Take 5 capsules (100 mg total) by mouth daily with breakfast. 05/20/19  Yes Clapacs, Jackquline DenmarkJohn T, MD    Allergies Patient has no known allergies.  No family history on file.  Social History Social History   Tobacco Use  . Smoking status: Current Every Day Smoker    Packs/day: 2.00    Types: Cigarettes  . Smokeless tobacco: Never Used  Substance Use Topics  . Alcohol use: Not Currently  .  Drug use: Yes    Types: Cocaine    Review of Systems  Constitutional: No fever/chills Eyes: No visual changes. ENT: Positive for mouth pain.  No sore throat. Cardiovascular: Denies chest pain. Respiratory: Denies shortness of breath. Gastrointestinal: No abdominal pain.  No nausea, no vomiting.  No diarrhea.  No constipation. Genitourinary: Negative for dysuria. Musculoskeletal: Negative for back pain. Skin: Negative for rash. Neurological: Negative for headaches, focal weakness or numbness. Psychiatric: Positive  for depression.  ____________________________________________   PHYSICAL EXAM:  VITAL SIGNS: ED Triage Vitals  Enc Vitals Group     BP 06/10/19 2229 (!) 150/100     Pulse Rate 06/10/19 2229 96     Resp 06/10/19 2229 18     Temp 06/10/19 2229 98.1 F (36.7 C)     Temp Source 06/10/19 2229 Oral     SpO2 06/10/19 2229 99 %     Weight 06/10/19 2230 168 lb (76.2 kg)     Height 06/10/19 2230 5\' 7"  (1.702 m)     Head Circumference --      Peak Flow --      Pain Score 06/10/19 2230 10     Pain Loc --      Pain Edu? --      Excl. in [place]? --     Constitutional: Alert and oriented. Well appearing and in no acute distress. Eyes: Conjunctivae are normal. PERRL. EOMI. Head: Atraumatic. Nose: No congestion/rhinnorhea. Mouth/Throat: Mucous membranes are moist.  Edentulous.  Uncooperative with exam. Neck: No stridor.   Cardiovascular: Normal rate, regular rhythm. Grossly normal heart sounds.  Good peripheral circulation. Respiratory: Normal respiratory effort.  No retractions. Lungs CTAB. Gastrointestinal: Soft and nontender. No distention. No abdominal bruits. No CVA tenderness. Musculoskeletal: No lower extremity tenderness nor edema.  No joint effusions. Neurologic:  Normal speech and language. No gross focal neurologic deficits are appreciated. No gait instability. Skin:  Skin is warm, dry and intact. No rash noted. Psychiatric: Mood and affect are agitated. Speech and behavior are normal.  ____________________________________________   LABS (all labs ordered are listed, but only abnormal results are displayed)  Labs Reviewed  COMPREHENSIVE METABOLIC PANEL  ETHANOL  SALICYLATE LEVEL  ACETAMINOPHEN LEVEL  CBC  URINE DRUG SCREEN, QUALITATIVE ([place])  POC URINE PREG, ED   ____________________________________________  EKG  None ____________________________________________  RADIOLOGY  ED MD interpretation: None  Official radiology report(s): No results found.   ____________________________________________   PROCEDURES  Procedure(s) performed (including Critical Care):  Procedures   ____________________________________________   INITIAL IMPRESSION / ASSESSMENT AND PLAN / ED COURSE  As part of my medical decision making, I reviewed the following data within the [facility] notes reviewed and incorporated, Old chart reviewed, A consult was requested and obtained from this/these consultant(s) Psychiatry and Notes from prior ED visits     Katrina Lindsey was evaluated in Emergency Department on 06/10/2019 for the symptoms described in the history of present illness. She was evaluated in the context of the global COVID-19 pandemic, which necessitated consideration that the patient might be at risk for infection with the SARS-CoV-2 virus that causes COVID-19. Institutional protocols and algorithms that pertain to the evaluation of patients at risk for COVID-19 are in a state of rapid change based on information released by regulatory bodies including the CDC and federal and state organizations. These policies and algorithms were followed during the patient's care in the ED.    53 year old female who presents voluntarily for depression.  History of same; most recently seen in the ED 9/8 by psychiatry and discharged home.  She is uncooperative for interview, blood draw and exam.  Vague SI/HI without plan.  Will consult psychiatry.   Clinical Course as of Jun 09 2334  Thu Jun 10, 2019  2333 Patient declines to speak with psychiatry.  She has eaten several meal trays and at this point wants to call her daughter to pick her up to go home.  Denies active SI/HI.  Strongly encouraged to follow-up at [facility].  Strict return precautions given.  Patient verbalizes understanding agrees with plan of care.   [JS]    Clinical Course User Index [JS] Irean Hong, MD     ____________________________________________   FINAL CLINICAL  IMPRESSION(S) / ED DIAGNOSES  Final diagnoses:  Moderate episode of recurrent major depressive disorder [facility])     ED Discharge Orders    None       Note:  This document was prepared using Dragon voice recognition software and may include unintentional dictation errors.   Irean Hong, MD 06/11/19 [phone]

## 2019-06-10 NOTE — ED Notes (Signed)
Pt is difficult stick, one unsuccessful attempt per EDT and one unsuccessful attempt per this RN.  Lab notified to collect blood specimen.

## 2019-06-11 DIAGNOSIS — F331 Major depressive disorder, recurrent, moderate: Secondary | ICD-10-CM | POA: Insufficient documentation

## 2019-06-11 NOTE — Consult Note (Signed)
[facility] Face-to-Face Psychiatry Consult   Reason for Consult: Depression Referring Physician: Dr. Dolores Frame Patient Identification: Katrina Lindsey MRN:  [id number] Principal Diagnosis: <principal problem not specified> Diagnosis:  Active Problems:   * No active hospital problems. *   Total Time spent with patient: 30 minutes  Subjective: " I am not talking to you.  No, I do not want to be here" Katrina Lindsey is a 53 y.o. female patient presented to [facility] ED via law enforcement voluntary. The patient is seen for depressive symptoms and is currently refusing to be assessed by this provider and the nursing staff.  She states the reason she came in is that "my dammed daughter made me come in."  Tonight's visit, she is agitated because she is here in the hospital. The patient states, "my daughter made me come here because I am using crack cocaine."  She continued to say, "I do not want to be here.  I want to be discharged."  Patient behavior is similar to her last Ed visit.    The patient was seen face-to-face by this provider; the chart reviewed and consulted with Dr. Dolores Frame on 06/10/2019 due to the patient's care. It was discussed with the EDP that the patient does not meet the criteria to be admitted to the psychiatric inpatient unit.    Collateral was not obtained from the patient's daughter Delorise Royals [phone]); this provider made several attempts without any answer. This provider could not leave a message due to the voice mail message not personalized.  Plan: The patient is not a safety risk to self or others and does not require psychiatric inpatient admission for stabilization and treatment.  HPI:  Per Dr. Dolores Frame: Katrina Lindsey is a 53 y.o. female brought to the ED via BPD voluntarily with a chief complaint of depression.  Patient is uncooperative and not forthcoming with information.  Reports vague SI/HI without plan.  Reports mouth pain.  Declines to participate further in  interview. To clarify triage note: [facility] paper work states last time (not last night) the patient felt this bad she did take pills and jump off a bridge.  No such event occurred recently.  Past Psychiatric History:   Risk to Self:  Yes Risk to Others:  No Prior Inpatient Therapy:  Yes Prior Outpatient Therapy:  Yes  Past Medical History:  Past Medical History:  Diagnosis Date  . Diabetes mellitus without complication (HCC)   . Hypertension    History reviewed. No pertinent surgical history. Family History: No family history on file. Family Psychiatric  History:  Social History:  Social History   Substance and Sexual Activity  Alcohol Use Not Currently     Social History   Substance and Sexual Activity  Drug Use Yes  . Types: Cocaine    Social History   Socioeconomic History  . Marital status: Single    Spouse name: Not on file  . Number of children: Not on file  . Years of education: Not on file  . Highest education level: Not on file  Occupational History  . Not on file  Social Needs  . Financial resource strain: Not on file  . Food insecurity    Worry: Not on file    Inability: Not on file  . Transportation needs    Medical: Not on file    Non-medical: Not on file  Tobacco Use  . Smoking status: Current Every Day Smoker    Packs/day: 2.00    Types: Cigarettes  .  Smokeless tobacco: Never Used  Substance and Sexual Activity  . Alcohol use: Not Currently  . Drug use: Yes    Types: Cocaine  . Sexual activity: Not on file  Lifestyle  . Physical activity    Days per week: Not on file    Minutes per session: Not on file  . Stress: Not on file  Relationships  . Social Musicianconnections    Talks on phone: Not on file    Gets together: Not on file    Attends religious service: Not on file    Active member of club or organization: Not on file    Attends meetings of clubs or organizations: Not on file    Relationship status: Not on file  Other Topics Concern  .  Not on file  Social History Narrative  . Not on file   Additional Social History:    Allergies:  No Known Allergies  Labs:  No results found for this or any previous visit (from the past 48 hour(s)).  No current facility-administered medications for this encounter.    Current Outpatient Medications  Medication Sig Dispense Refill  . aspirin EC 81 MG EC tablet Take 1 tablet (81 mg total) by mouth daily. 30 tablet 0  . atorvastatin (LIPITOR) 40 MG tablet Take 1 tablet (40 mg total) by mouth daily at 6 PM. 30 tablet 0  . divalproex (DEPAKOTE ER) 500 MG 24 hr tablet Take 2 tablets (1,000 mg total) by mouth at bedtime. 60 tablet 0  . HUMULIN 70/30 KWIKPEN (70-30) 100 UNIT/ML PEN INJECT 25 UNITS UNDER THE SKIN BEFORE BREAKFAST AND 25 UNITS NEFORE DINNER    . levothyroxine (SYNTHROID) 100 MCG tablet Take 1 tablet (100 mcg total) by mouth daily at 6 (six) AM. 30 tablet 0  . lisinopril (ZESTRIL) 20 MG tablet Take 1 tablet (20 mg total) by mouth daily. 30 tablet 0  . loratadine (CLARITIN) 10 MG tablet Take 1 tablet (10 mg total) by mouth daily. 30 tablet 0  . metFORMIN (GLUCOPHAGE) 1000 MG tablet Take 1,000 mg by mouth 2 (two) times daily with a meal.    . mirtazapine (REMERON) 15 MG tablet Take 1 tablet (15 mg total) by mouth at bedtime. 30 tablet 0  . naproxen (NAPROSYN) 500 MG tablet Take 500 mg by mouth 2 (two) times daily as needed.    Marland Kitchen. PROVENTIL HFA 108 (90 Base) MCG/ACT inhaler INHALE 2 PUFFS BY MOUTH EVERY 6 HOURS AS NEEDED FOR COUGHING, WHEEZING, OR SHORTNESS OF BREATH    . traZODone (DESYREL) 50 MG tablet Take 1 tablet (50 mg total) by mouth at bedtime. 30 tablet 0  . ziprasidone (GEODON) 20 MG capsule Take 5 capsules (100 mg total) by mouth daily with breakfast. 150 capsule 0    Musculoskeletal: Strength & Muscle Tone: within normal limits Gait & Station: normal Patient leans: N/A  Psychiatric Specialty Exam: Physical Exam  Nursing note and vitals reviewed. Constitutional:  She is oriented to person, place, and time. She appears well-developed and well-nourished.  HENT:  Head: Normocephalic.  Eyes: Pupils are equal, round, and reactive to light.  Neck: Normal range of motion. Neck supple.  Cardiovascular: Normal rate.  Respiratory: Effort normal.  Musculoskeletal: Normal range of motion.  Neurological: She is alert and oriented to person, place, and time.  Skin: Skin is warm and dry.    Review of Systems  Psychiatric/Behavioral: Positive for depression and substance abuse. The patient is nervous/anxious.   All other systems reviewed and  are negative.   Blood pressure (!) 145/94, pulse 81, temperature 98.1 F (36.7 C), temperature source Oral, resp. rate 18, height 5\' 7"  (1.702 m), weight 76.2 kg, SpO2 99 %.Body mass index is 26.31 kg/m.  General Appearance: Disheveled  Eye Contact:  Fair  Speech:  Clear and Coherent  Volume:  Decreased  Mood:  Anxious, Depressed and Irritable  Affect:  Congruent, Depressed, Flat and Tearful  Thought Process:  Coherent  Orientation:  Full (Time, Place, and Person)  Thought Content:  Logical  Suicidal Thoughts:  No  Homicidal Thoughts:  No  Memory:  Immediate;   Good Recent;   Good Remote;   Good  Judgement:  Fair  Insight:  Fair  Psychomotor Activity:  Normal  Concentration:  Concentration: Fair  Recall:  Good  Fund of Knowledge:  Good  Language:  Good  Akathisia:  Negative  Handed:  Right  AIMS (if indicated):     Assets:  Desire for Improvement Housing Physical Health Social Support  ADL's:  Intact  Cognition:  WNL  Sleep:   Okay     Treatment Plan Summary: Plan The patient does not meet criteria for psychiatric inpatient admission.  Disposition: No evidence of imminent risk to self or others at present.   Patient does not meet criteria for psychiatric inpatient admission. Supportive therapy provided about ongoing stressors.  Caroline Sauger, NP 06/11/2019 4:16 AM

## 2019-10-28 ENCOUNTER — Encounter: Payer: Self-pay | Admitting: Emergency Medicine

## 2019-10-28 ENCOUNTER — Other Ambulatory Visit: Payer: Self-pay

## 2019-10-28 ENCOUNTER — Emergency Department
Admission: EM | Admit: 2019-10-28 | Discharge: 2019-10-28 | Disposition: A | Discharge status: Home / Self Care | Payer: Self-pay | Attending: Emergency Medicine | Admitting: Emergency Medicine

## 2019-10-28 DIAGNOSIS — I10 Essential (primary) hypertension: Secondary | ICD-10-CM | POA: Insufficient documentation

## 2019-10-28 DIAGNOSIS — Z794 Long term (current) use of insulin: Secondary | ICD-10-CM | POA: Insufficient documentation

## 2019-10-28 DIAGNOSIS — H5461 Unqualified visual loss, right eye, normal vision left eye: Secondary | ICD-10-CM | POA: Insufficient documentation

## 2019-10-28 DIAGNOSIS — Z7982 Long term (current) use of aspirin: Secondary | ICD-10-CM | POA: Insufficient documentation

## 2019-10-28 DIAGNOSIS — E039 Hypothyroidism, unspecified: Secondary | ICD-10-CM | POA: Insufficient documentation

## 2019-10-28 DIAGNOSIS — E119 Type 2 diabetes mellitus without complications: Secondary | ICD-10-CM | POA: Insufficient documentation

## 2019-10-28 DIAGNOSIS — Z79899 Other long term (current) drug therapy: Secondary | ICD-10-CM | POA: Insufficient documentation

## 2019-10-28 DIAGNOSIS — F17210 Nicotine dependence, cigarettes, uncomplicated: Secondary | ICD-10-CM | POA: Insufficient documentation

## 2019-10-28 DIAGNOSIS — K120 Recurrent oral aphthae: Secondary | ICD-10-CM | POA: Insufficient documentation

## 2019-10-28 MED ORDER — IBUPROFEN 600 MG PO TABS
600.0000 mg | ORAL_TABLET | Freq: Once | ORAL | Status: AC
  Administered 2019-10-28: 12:00:00 600 mg via ORAL
  Filled 2019-10-28: qty 1

## 2019-10-28 MED ORDER — CHLORHEXIDINE GLUCONATE 0.12 % MT SOLN: 15.0000 mL | Freq: Two times a day (BID) | OROMUCOSAL | 0 refills | Status: AC

## 2019-10-28 MED ORDER — LIDOCAINE VISCOUS HCL 2 % MT SOLN: 15.0000 mL | Freq: Four times a day (QID) | OROMUCOSAL | 0 refills | Status: AC | PRN

## 2019-10-28 MED ORDER — TETRACAINE HCL 0.5 % OP SOLN
1.0000 [drp] | Freq: Once | OPHTHALMIC | Status: AC
  Administered 2019-10-28: 12:00:00 1 [drp] via OPHTHALMIC
  Filled 2019-10-28: qty 4

## 2019-10-28 MED ORDER — DEXAMETHASONE 4 MG PO TABS
12.0000 mg | ORAL_TABLET | Freq: Once | ORAL | Status: AC
  Administered 2019-10-28: 12:00:00 12 mg via ORAL
  Filled 2019-10-28: qty 3

## 2019-10-28 MED ORDER — FLUORESCEIN SODIUM 1 MG OP STRP
1.0000 | ORAL_STRIP | Freq: Once | OPHTHALMIC | Status: AC
  Administered 2019-10-28: 12:00:00 1 via OPHTHALMIC
  Filled 2019-10-28: qty 1

## 2019-10-28 NOTE — Discharge Instructions (Signed)
Please proceed straight to the eye doctor's office now for evaluation this afternoon.  Return to the ER for new or worsening vision changes, vision loss, eye pain, throat pain or difficulty swallowing, worsening sores or rash, or any other new or worsening symptoms that concern you.  Use the lidocaine and chlorhexidine as a swish and spit twice daily to help with the sores in your mouth.

## 2019-10-28 NOTE — ED Notes (Signed)
Pt may eat and drink per EDP, pt given juice and crackers. Warm blanket given.

## 2019-10-28 NOTE — ED Triage Notes (Signed)
Patient presents to the ED with sore throat and rash to arms and face x 1 week.  Rash appears minimal and patient states it is going away.  Patient states she feels like she has sores in her mouth.  Patient states she woke up this morning and felt like she had a film over her right eye that made it difficult to see.  Patient states, "it's real blurry".

## 2019-10-28 NOTE — ED Provider Notes (Signed)
[facility] Emergency Department Provider Note ____________________________________________   First MD Initiated Contact with Patient 10/28/19 1013     (approximate)  I have reviewed the triage vital signs and the nursing notes.   HISTORY  Chief Complaint Sore Throat and Eye Problem    HPI Katrina Lindsey is a 54 y.o. female with PMH as noted below presents with several complaints notably blurred vision to the right eye.  Onset was acute and the patient awoke this morning.  She states that she can still see faces but it is significantly blurrier than the left.  She denies any pain to the eye or any foreign body sensation.  She has not had any injury to the eye.  In addition, the patient reports a sore throat, congestion, and some sores in her mouth.  She also reports a rash to her face earlier today which has since disappeared.  Past Medical History:  Diagnosis Date  . Diabetes mellitus without complication (HCC)   . Hypertension     Patient Active Problem List   Diagnosis Date Noted  . Moderate episode of recurrent major depressive disorder (HCC)   . MDD (major depressive disorder), severe (HCC) 05/19/2019  . Adjustment disorder with mixed anxiety and depressed mood 05/19/2019  . Schizoaffective disorder, bipolar type (HCC) 05/19/2019  . Diabetes (HCC) 05/19/2019  . Essential hypertension 05/19/2019  . Hypothyroidism 05/19/2019  . MDD (major depressive disorder), recurrent episode, severe (HCC) 05/18/2019  . Cocaine abuse with cocaine-induced mood disorder (HCC) 01/15/2019    History reviewed. No pertinent surgical history.  Prior to Admission medications   Medication Sig Start Date End Date Taking? Authorizing Provider  aspirin EC 81 MG EC tablet Take 1 tablet (81 mg total) by mouth daily. 05/19/19   Clapacs, Jackquline Denmark, MD  atorvastatin (LIPITOR) 40 MG tablet Take 1 tablet (40 mg total) by mouth daily at 6 PM. 05/19/19   Clapacs, Jackquline Denmark, MD    chlorhexidine (PERIDEX) 0.12 % solution Use as directed 15 mLs in the mouth or throat 2 (two) times daily. Swish and spit 10/28/19   Dionne Bucy, MD  divalproex (DEPAKOTE ER) 500 MG 24 hr tablet Take 2 tablets (1,000 mg total) by mouth at bedtime. 05/19/19   Clapacs, Jackquline Denmark, MD  HUMULIN 70/30 KWIKPEN (70-30) 100 UNIT/ML PEN Inject 25 Units into the skin 2 (two) times daily before a meal.  05/25/19   [provider]  levothyroxine (SYNTHROID) 100 MCG tablet Take 1 tablet (100 mcg total) by mouth daily at 6 (six) AM. 05/20/19   Clapacs, Jackquline Denmark, MD  lidocaine (XYLOCAINE) 2 % solution Use as directed 15 mLs in the mouth or throat every 6 (six) hours as needed for mouth pain. Swish and spit 10/28/19   Dionne Bucy, MD  lisinopril (ZESTRIL) 20 MG tablet Take 1 tablet (20 mg total) by mouth daily. 05/19/19   Clapacs, Jackquline Denmark, MD  loratadine (CLARITIN) 10 MG tablet Take 1 tablet (10 mg total) by mouth daily. 05/19/19   Clapacs, Jackquline Denmark, MD  metFORMIN (GLUCOPHAGE) 1000 MG tablet Take 1,000 mg by mouth 2 (two) times daily with a meal. 05/25/19   [provider]  mirtazapine (REMERON) 15 MG tablet Take 1 tablet (15 mg total) by mouth at bedtime. 05/19/19   Clapacs, Jackquline Denmark, MD  naproxen (NAPROSYN) 500 MG tablet Take 500 mg by mouth 2 (two) times daily as needed. 05/25/19   [provider]  PROVENTIL HFA 108 (90 Base) MCG/ACT  inhaler INHALE 2 PUFFS BY MOUTH EVERY 6 HOURS AS NEEDED FOR COUGHING, WHEEZING, OR SHORTNESS OF BREATH 05/25/19   [provider]  traZODone (DESYREL) 50 MG tablet Take 1 tablet (50 mg total) by mouth at bedtime. 05/19/19   Clapacs, Madie Reno, MD  ziprasidone (GEODON) 20 MG capsule Take 5 capsules (100 mg total) by mouth daily with breakfast. 05/20/19   Clapacs, Madie Reno, MD    Allergies Patient has no known allergies.  No family history on file.  Social History Social History   Tobacco Use  . Smoking status: Current Every Day Smoker    Packs/day: 2.00     Types: Cigarettes  . Smokeless tobacco: Never Used  Substance Use Topics  . Alcohol use: Not Currently  . Drug use: Yes    Types: Cocaine    Review of Systems  Constitutional: No fever/chills. Eyes: Positive for blurred vision in the right eye. ENT: Positive for nasal congestion, sore throat, and oral ulcers. Cardiovascular: Denies chest pain. Respiratory: Denies shortness of breath. Gastrointestinal: No vomiting or diarrhea.  Genitourinary: Negative for dysuria.  Musculoskeletal: Negative for back pain. Skin: Negative for rash. Neurological: Negative for headaches, focal weakness or numbness.   ____________________________________________   PHYSICAL EXAM:  VITAL SIGNS: ED Triage Vitals [10/28/19 0939]  Enc Vitals Group     BP 99/70     Pulse Rate 95     Resp 18     Temp 98.8 F (37.1 C)     Temp Source Oral     SpO2 97 %     Weight 170 lb (77.1 kg)     Height 5\' 7"  (1.702 m)     Head Circumference      Peak Flow      Pain Score 8     Pain Loc      Pain Edu?      Excl. in [place]?     Constitutional: Alert and oriented.  Relatively well appearing and in no acute distress. Eyes: Conjunctivae are normal.  EOMI.  PERRLA.  No uptake in the right eye on fluorescein exam. Head: Atraumatic.  No facial droop. Nose: No congestion/rhinnorhea. Mouth/Throat: Mucous membranes are moist.  Oropharynx clear with no exudates or erythema.  No swelling.  A few scattered aphthous type ulcers. Neck: Normal range of motion.  Cardiovascular: Normal rate, regular rhythm.  Good peripheral circulation. Respiratory: Normal respiratory effort.  No retractions.  Gastrointestinal: No distention.  Musculoskeletal:  Extremities warm and well perfused.  Neurologic:  Normal speech and language.  Motor and sensory intact in all extremities.  No ataxia.  No motor drift.  No facial droop. Skin:  Skin is warm and dry. No rash noted. Psychiatric: Mood and affect are normal. Speech and behavior are  normal.  ____________________________________________   LABS (all labs ordered are listed, but only abnormal results are displayed)  Labs Reviewed - No data to display ____________________________________________  EKG   ____________________________________________  RADIOLOGY    ____________________________________________   PROCEDURES  Procedure(s) performed: No  Procedures  Critical Care performed: No ____________________________________________   INITIAL IMPRESSION / ASSESSMENT AND PLAN / ED COURSE  Pertinent labs & imaging results that were available during my care of the patient were reviewed by me and considered in my medical decision making (see chart for details).  54 year old female with PMH as noted above presents with acute onset of right eye blurred vision when she awoke this morning as well as complaint of sore throat, nasal congestion, and  some ulcers in her mouth.  She also reports that she had a rash to her face which has since disappeared.  On exam, the patient is overall well-appearing and her vital signs are normal.  Her neurologic exam is nonfocal.  Exam of the right eye reveals no significant external findings, and there is no evidence of corneal abrasion or ulcer on fluorescein exam.  The ocular pressure is 21-22 when checked with a Tono-Pen.  Patient has a few aphthous ulcers in her mouth but no other significant findings in the mouth or throat, and an otherwise unremarkable exam.  Given the normal for exam exam and pressure, differential for the right eye vision loss is broad but includes retinal etiology such as retinal detachment, other intraocular etiology such as vitreous hemorrhage or detachment, or possible vascular or inflammatory etiology.  At this time, the patient has no findings to suggest acute stroke.  I discussed the case with Dr. Druscilla Brownie from ophthalmology who recommends that the patient go directly to the eye clinic after the ED visit  for further evaluation by them today.  The other symptoms are most consistent with a viral type URI.  I gave a dose of ibuprofen and Decadron in ED for symptomatic control.  The oral lesions are consistent with aphthous ulcers, and I have prescribed chlorhexidine and lidocaine for symptomatic treatment.  I counseled the patient on the results of the work-up and the plan of care.  She agrees to proceed directly to the ophthalmology office.  Return precautions given, and she expresses understanding. ____________________________________________   FINAL CLINICAL IMPRESSION(S) / ED DIAGNOSES  Final diagnoses:  Vision loss of right eye  Oral aphthous ulcer      NEW MEDICATIONS STARTED DURING THIS VISIT:  Discharge Medication List as of 10/28/2019 12:37 PM    START taking these medications   Details  chlorhexidine (PERIDEX) 0.12 % solution Use as directed 15 mLs in the mouth or throat 2 (two) times daily. Swish and spit, Starting Thu 10/28/2019, Normal    lidocaine (XYLOCAINE) 2 % solution Use as directed 15 mLs in the mouth or throat every 6 (six) hours as needed for mouth pain. Swish and spit, Starting Thu 10/28/2019, Normal         Note:  This document was prepared using Dragon voice recognition software and may include unintentional dictation errors.   Dionne Bucy, MD 10/28/19 [phone]

## 2019-11-02 ENCOUNTER — Encounter: Payer: Self-pay | Admitting: Emergency Medicine

## 2019-11-02 ENCOUNTER — Emergency Department
Admission: EM | Admit: 2019-11-02 | Discharge: 2019-11-02 | Disposition: A | Discharge status: Home / Self Care | Payer: Self-pay | Attending: Emergency Medicine | Admitting: Emergency Medicine

## 2019-11-02 ENCOUNTER — Emergency Department: Payer: Self-pay

## 2019-11-02 ENCOUNTER — Other Ambulatory Visit: Payer: Self-pay

## 2019-11-02 DIAGNOSIS — Z794 Long term (current) use of insulin: Secondary | ICD-10-CM | POA: Insufficient documentation

## 2019-11-02 DIAGNOSIS — F17210 Nicotine dependence, cigarettes, uncomplicated: Secondary | ICD-10-CM | POA: Insufficient documentation

## 2019-11-02 DIAGNOSIS — J069 Acute upper respiratory infection, unspecified: Secondary | ICD-10-CM | POA: Insufficient documentation

## 2019-11-02 DIAGNOSIS — Z7982 Long term (current) use of aspirin: Secondary | ICD-10-CM | POA: Insufficient documentation

## 2019-11-02 DIAGNOSIS — Z79899 Other long term (current) drug therapy: Secondary | ICD-10-CM | POA: Insufficient documentation

## 2019-11-02 DIAGNOSIS — K120 Recurrent oral aphthae: Secondary | ICD-10-CM | POA: Insufficient documentation

## 2019-11-02 DIAGNOSIS — E119 Type 2 diabetes mellitus without complications: Secondary | ICD-10-CM | POA: Insufficient documentation

## 2019-11-02 DIAGNOSIS — I10 Essential (primary) hypertension: Secondary | ICD-10-CM | POA: Insufficient documentation

## 2019-11-02 DIAGNOSIS — Z20822 Contact with and (suspected) exposure to covid-19: Secondary | ICD-10-CM | POA: Insufficient documentation

## 2019-11-02 LAB — GROUP A STREP BY PCR: Group A Strep by PCR: NOT DETECTED

## 2019-11-02 LAB — SARS CORONAVIRUS 2 (TAT 6-24 HRS): SARS Coronavirus 2: NEGATIVE

## 2019-11-02 MED ORDER — PROMETHAZINE-DM 6.25-15 MG/5ML PO SYRP: 5.0000 mL | ORAL_SOLUTION | Freq: Four times a day (QID) | ORAL | 0 refills | Status: AC | PRN

## 2019-11-02 NOTE — ED Triage Notes (Signed)
Says her throat and mouth are still painful despite the med we gave her.

## 2019-11-02 NOTE — ED Provider Notes (Signed)
[facility] Emergency Department Provider Note   ____________________________________________   First MD Initiated Contact with Patient 11/02/19 [phone]     (approximate)  I have reviewed the triage vital signs and the nursing notes.   HISTORY  Chief Complaint Sore Throat    HPI Katrina Lindsey is a 54 y.o. female patient complain of sore throat, productive cough, and no improvement with oral ulcers.  Patient state can tolerate fluids and soft foods.  Denies fever chills associated complaint.  Denies recent travel or known contact with COVID-19.         Past Medical History:  Diagnosis Date  . Diabetes mellitus without complication ([place])   . Hypertension     Patient Active Problem List   Diagnosis Date Noted  . Moderate episode of recurrent major depressive disorder ([place])   . MDD (major depressive disorder), severe ([place]) 05/19/2019  . Adjustment disorder with mixed anxiety and depressed mood 05/19/2019  . Schizoaffective disorder, bipolar type ([place]) 05/19/2019  . Diabetes ([place]) 05/19/2019  . Essential hypertension 05/19/2019  . Hypothyroidism 05/19/2019  . MDD (major depressive disorder), recurrent episode, severe ([place]) 05/18/2019  . Cocaine abuse with cocaine-induced mood disorder ([place]) 01/15/2019    History reviewed. No pertinent surgical history.  Prior to Admission medications   Medication Sig Start Date End Date Taking? Authorizing Provider  aspirin EC 81 MG EC tablet Take 1 tablet (81 mg total) by mouth daily. 05/19/19   Clapacs, Madie Reno, MD  atorvastatin (LIPITOR) 40 MG tablet Take 1 tablet (40 mg total) by mouth daily at 6 PM. 05/19/19   Clapacs, Madie Reno, MD  chlorhexidine (PERIDEX) 0.12 % solution Use as directed 15 mLs in the mouth or throat 2 (two) times daily. Swish and spit 10/28/19   Arta Silence, MD  divalproex (DEPAKOTE ER) 500 MG 24 hr tablet Take 2 tablets (1,000 mg total) by mouth at bedtime. 05/19/19   Clapacs, Madie Reno, MD    HUMULIN 70/30 KWIKPEN (70-30) 100 UNIT/ML PEN Inject 25 Units into the skin 2 (two) times daily before a meal.  05/25/19   [provider]  levothyroxine (SYNTHROID) 100 MCG tablet Take 1 tablet (100 mcg total) by mouth daily at 6 (six) AM. 05/20/19   Clapacs, Madie Reno, MD  lidocaine (XYLOCAINE) 2 % solution Use as directed 15 mLs in the mouth or throat every 6 (six) hours as needed for mouth pain. Swish and spit 10/28/19   Arta Silence, MD  lisinopril (ZESTRIL) 20 MG tablet Take 1 tablet (20 mg total) by mouth daily. 05/19/19   Clapacs, Madie Reno, MD  loratadine (CLARITIN) 10 MG tablet Take 1 tablet (10 mg total) by mouth daily. 05/19/19   Clapacs, Madie Reno, MD  metFORMIN (GLUCOPHAGE) 1000 MG tablet Take 1,000 mg by mouth 2 (two) times daily with a meal. 05/25/19   [provider]  mirtazapine (REMERON) 15 MG tablet Take 1 tablet (15 mg total) by mouth at bedtime. 05/19/19   Clapacs, Madie Reno, MD  naproxen (NAPROSYN) 500 MG tablet Take 500 mg by mouth 2 (two) times daily as needed. 05/25/19   [provider]  promethazine-dextromethorphan (PROMETHAZINE-DM) 6.25-15 MG/5ML syrup Take 5 mLs by mouth 4 (four) times daily as needed for cough. 11/02/19   Sable Feil, PA-C  PROVENTIL HFA 108 (90 Base) MCG/ACT inhaler INHALE 2 PUFFS BY MOUTH EVERY 6 HOURS AS NEEDED FOR COUGHING, WHEEZING, OR SHORTNESS OF BREATH 05/25/19   [provider]  traZODone (DESYREL) 50  MG tablet Take 1 tablet (50 mg total) by mouth at bedtime. 05/19/19   Clapacs, Jackquline Denmark, MD  ziprasidone (GEODON) 20 MG capsule Take 5 capsules (100 mg total) by mouth daily with breakfast. 05/20/19   Clapacs, Jackquline Denmark, MD    Allergies Patient has no known allergies.  No family history on file.  Social History Social History   Tobacco Use  . Smoking status: Current Every Day Smoker    Packs/day: 2.00    Types: Cigarettes  . Smokeless tobacco: Never Used  Substance Use Topics  . Alcohol use: Not Currently  . Drug use: Yes     Types: Cocaine    Review of Systems  Constitutional: No fever/chills Eyes: No visual changes. ENT: No sore throat. Cardiovascular: Denies chest pain. Respiratory: Denies shortness of breath. Gastrointestinal: No abdominal pain.  No nausea, no vomiting.  No diarrhea.  No constipation. Genitourinary: Negative for dysuria. Musculoskeletal: Negative for back pain. Skin: Negative for rash. Neurological: Negative for headaches, focal weakness or numbness. Psychiatric:  Bipolar, depression, schizophrenic. Endocrine: Diabetes and hypertension  ____________________________________________   PHYSICAL EXAM:  VITAL SIGNS: ED Triage Vitals [11/02/19 0900]  Enc Vitals Group     BP 111/81     Pulse Rate 86     Resp 16     Temp 98.2 F (36.8 C)     Temp Source Oral     SpO2 97 %     Weight 169 lb 15.6 oz (77.1 kg)     Height 5\' 7"  (1.702 m)     Head Circumference      Peak Flow      Pain Score 9     Pain Loc      Pain Edu?      Excl. in [place]?    Constitutional: Alert and oriented. Well appearing and in no acute distress. Eyes: Conjunctivae are normal. PERRL. EOMI. Head: Atraumatic. Nose: Edematous nasal turbinates. Mouth/Throat: Mucous membranes are moist.  Oropharynx non-erythematous.  Postnasal drainage.  Oral ulcers. Neck: No stridor.   Hematological/Lymphatic/Immunilogical: Bilateral cervical lymphadenopathy. Cardiovascular: Normal rate, regular rhythm. Grossly normal heart sounds.  Good peripheral circulation. Respiratory: Normal respiratory effort.  No retractions. Lungs CTAB. Neurologic:  Normal speech and language. No gross focal neurologic deficits are appreciated. No gait instability. Skin:  Skin is warm, dry and intact. No rash noted. Psychiatric: Mood and affect are normal. Speech and behavior are normal.  ____________________________________________   LABS (all labs ordered are listed, but only abnormal results are displayed)  Labs Reviewed  GROUP A STREP BY  PCR  SARS CORONAVIRUS 2 (TAT 6-24 HRS)   ____________________________________________  EKG   ____________________________________________  RADIOLOGY  ED MD interpretation:    Official radiology report(s): DG Chest Portable 1 View  Result Date: 11/02/2019 CLINICAL DATA:  Cough and increased breath sounds on the right EXAM: PORTABLE CHEST 1 VIEW COMPARISON:  None. FINDINGS: The heart size and mediastinal contours are within normal limits. Both lungs are clear. The visualized skeletal structures are unremarkable. IMPRESSION: No active disease. Electronically Signed   By: 11/04/2019 M.D.   On: 11/02/2019 09:40    ____________________________________________   PROCEDURES  Procedure(s) performed (including Critical Care):  Procedures   ____________________________________________   INITIAL IMPRESSION / ASSESSMENT AND PLAN / ED COURSE  As part of my medical decision making, I reviewed the following data within the electronic MEDICAL RECORD NUMBER     Patient presents with continue oral ulcers, sore throat, and productive cough.  Discussed  negative lab and x-ray findings with patient.  Patient physical exam consistent with upper respiratory infection with cough.  Patient advised to continue previous medication.  Patient given prescription for Phenergan DM.  Patient advised [facility] with [facility].  Patient advised self quarantine pending results of COVID-19 results.    Scherrie Seneca was evaluated in Emergency Department on 11/02/2019 for the symptoms described in the history of present illness. She was evaluated in the context of the global COVID-19 pandemic, which necessitated consideration that the patient might be at risk for infection with the SARS-CoV-2 virus that causes COVID-19. Institutional protocols and algorithms that pertain to the evaluation of patients at risk for COVID-19 are in a state of rapid change based on information released by regulatory bodies  including the CDC and federal and state organizations. These policies and algorithms were followed during the patient's care in the ED.       ____________________________________________   FINAL CLINICAL IMPRESSION(S) / ED DIAGNOSES  Final diagnoses:  Aphthous ulcer of mouth  Viral URI with cough     ED Discharge Orders         Ordered    promethazine-dextromethorphan (PROMETHAZINE-DM) 6.25-15 MG/5ML syrup  4 times daily PRN     11/02/19 1119           Note:  This document was prepared using Dragon voice recognition software and may include unintentional dictation errors.    Joni Reining, PA-C 11/02/19 1122    Sharman Cheek, MD 11/02/19 [phone]

## 2019-11-02 NOTE — Discharge Instructions (Signed)
Follow discharge care instruction continue previous medications. °

## 2019-11-02 NOTE — ED Notes (Signed)
See triage note  Presents with sore throat for the past 2 weeks.  States she was seen last week  But pain is still there and she also has mouth ulcers  Afebrile on arrival

## 2019-12-13 ENCOUNTER — Ambulatory Visit: Payer: Self-pay

## 2019-12-16 ENCOUNTER — Ambulatory Visit: Payer: Self-pay | Admitting: Physician Assistant

## 2019-12-16 ENCOUNTER — Other Ambulatory Visit: Payer: Self-pay

## 2019-12-27 ENCOUNTER — Other Ambulatory Visit: Payer: Self-pay

## 2019-12-27 ENCOUNTER — Ambulatory Visit: Payer: Self-pay

## 2019-12-27 ENCOUNTER — Encounter: Payer: Self-pay | Admitting: Advanced Practice Midwife

## 2019-12-27 ENCOUNTER — Ambulatory Visit: Payer: Self-pay | Admitting: Advanced Practice Midwife

## 2019-12-27 DIAGNOSIS — Z113 Encounter for screening for infections with a predominantly sexual mode of transmission: Secondary | ICD-10-CM

## 2019-12-27 DIAGNOSIS — F1414 Cocaine abuse with cocaine-induced mood disorder: Secondary | ICD-10-CM

## 2019-12-27 DIAGNOSIS — A599 Trichomoniasis, unspecified: Secondary | ICD-10-CM

## 2019-12-27 DIAGNOSIS — F17200 Nicotine dependence, unspecified, uncomplicated: Secondary | ICD-10-CM

## 2019-12-27 LAB — WET PREP FOR TRICH, YEAST, CLUE
Trichomonas Exam: POSITIVE — AB
Yeast Exam: NEGATIVE

## 2019-12-27 MED ORDER — METRONIDAZOLE 500 MG PO TABS: 2000.0000 mg | ORAL_TABLET | Freq: Once | ORAL | 0 refills | Status: AC

## 2019-12-27 NOTE — Progress Notes (Signed)
[facility] Department STI clinic/screening visit  Subjective:  Katrina Lindsey is a 54 y.o. female being seen today for an STI screening visit. The patient reports they do have symptoms.  Patient reports that they do not desire a pregnancy in the next year.   They reported they are not interested in discussing contraception today.  No LMP recorded. (Menstrual status: Perimenopausal).   Patient has the following medical conditions:   Patient Active Problem List   Diagnosis Date Noted  . Smoker 1-2 ppd 12/27/2019  . Moderate episode of recurrent major depressive disorder (HCC)   . MDD (major depressive disorder), severe (HCC) 05/19/2019  . Adjustment disorder with mixed anxiety and depressed mood 05/19/2019  . Schizoaffective disorder, bipolar type (HCC) 05/19/2019  . Diabetes (HCC) 05/19/2019  . Essential hypertension 05/19/2019  . Hypothyroidism 05/19/2019  . MDD (major depressive disorder), recurrent episode, severe (HCC) 05/18/2019  . Cocaine abuse with cocaine-induced mood disorder (HCC) 01/15/2019    Chief Complaint  Patient presents with  . SEXUALLY TRANSMITTED DISEASE    HPI  Patient reports discharge with malodor x 1 mo.  LMP: none, last sex 06/2019 without condom  See flowsheet for further details and programmatic requirements.    The following portions of the patient's history were reviewed and updated as appropriate: allergies, current medications, past medical history, past social history, past surgical history and problem list.  Objective:  There were no vitals filed for this visit.  Physical Exam Vitals and nursing note reviewed.  Constitutional:      Appearance: Normal appearance.  HENT:     Head: Normocephalic and atraumatic.     Mouth/Throat:     Mouth: Mucous membranes are moist.     Pharynx: Oropharynx is clear. No oropharyngeal exudate or posterior oropharyngeal erythema.  Pulmonary:     Effort: Pulmonary effort is normal.   Abdominal:     General: Abdomen is flat.     Palpations: Abdomen is soft. There is no mass.     Tenderness: There is no abdominal tenderness. There is no rebound.  Genitourinary:    General: Normal vulva.     Exam position: Lithotomy position.     Pubic Area: No rash or pubic lice.      Labia:        Right: No rash or lesion.        Left: No rash or lesion.      Vagina: Vaginal discharge (small amt white creamy leukorrhea, ph>4.5) present. No erythema, bleeding or lesions.     Cervix: Erythema (erythema) present. No cervical motion tenderness, discharge, friability or lesion.     Uterus: Normal.      Adnexa: Right adnexa normal and left adnexa normal.     Rectum: Normal.  Lymphadenopathy:     Head:     Right side of head: No preauricular or posterior auricular adenopathy.     Left side of head: No preauricular or posterior auricular adenopathy.     Cervical: No cervical adenopathy.     Upper Body:     Right upper body: No supraclavicular or axillary adenopathy.     Left upper body: No supraclavicular or axillary adenopathy.     Lower Body: No right inguinal adenopathy. No left inguinal adenopathy.  Skin:    General: Skin is warm and dry.     Findings: No rash.  Neurological:     Mental Status: She is alert and oriented to person, place, and time.  Assessment and Plan:  Katrina Lindsey is a 54 y.o. female presenting to the [facility] Department for STI screening  1. Screening examination for venereal disease Treat wet mount per standing orders Immunization nurse consult - WET PREP FOR [place], YEAST, [facility]  2. Cocaine abuse with cocaine-induced mood disorder ([place]) Pt states last cocaine 4 mo ago; last crack 6 mo ago, last MJ 2 mo ago  3. Smoker 1-2 ppd Counseled via 5 A's to stop smoking     Return if symptoms worsen or fail to improve.  No future appointments.  Herbie Saxon, CNM

## 2019-12-27 NOTE — Progress Notes (Signed)
In for screening due to c/o discharge with odor x ~1 month; declines HIV/RPR testing Sharlette Dense, RN Wet prep Marjie Skiff treated per Hazle Coca, CNM; declines contact card-states last partner was husband, now deceased Sharlette Dense, RN

## 2020-02-17 ENCOUNTER — Encounter: Payer: Self-pay | Admitting: Physician Assistant

## 2020-02-17 ENCOUNTER — Ambulatory Visit: Payer: Self-pay | Admitting: Physician Assistant

## 2020-02-17 ENCOUNTER — Other Ambulatory Visit: Payer: Self-pay

## 2020-02-17 DIAGNOSIS — Z113 Encounter for screening for infections with a predominantly sexual mode of transmission: Secondary | ICD-10-CM

## 2020-02-17 DIAGNOSIS — N760 Acute vaginitis: Secondary | ICD-10-CM

## 2020-02-17 DIAGNOSIS — B9689 Other specified bacterial agents as the cause of diseases classified elsewhere: Secondary | ICD-10-CM

## 2020-02-17 LAB — WET PREP FOR TRICH, YEAST, CLUE
Trichomonas Exam: NEGATIVE
Yeast Exam: NEGATIVE

## 2020-02-17 MED ORDER — METRONIDAZOLE 500 MG PO TABS: 500.0000 mg | ORAL_TABLET | Freq: Two times a day (BID) | ORAL | 0 refills | Status: AC

## 2020-02-17 NOTE — Progress Notes (Signed)
[facility] Department STI clinic/screening visit  Subjective:  Katrina Lindsey is a 54 y.o. female being seen today for an STI screening visit. The patient reports they do have symptoms.  Patient reports that they do not desire a pregnancy in the next year.   They reported they are not interested in discussing contraception today.  No LMP recorded. (Menstrual status: Perimenopausal).   Patient has the following medical conditions:   Patient Active Problem List   Diagnosis Date Noted  . Smoker 1-2 ppd 12/27/2019  . Moderate episode of recurrent major depressive disorder (HCC)   . MDD (major depressive disorder), severe (HCC) 05/19/2019  . Adjustment disorder with mixed anxiety and depressed mood 05/19/2019  . Schizoaffective disorder, bipolar type (HCC) 05/19/2019  . Diabetes (HCC) 05/19/2019  . Essential hypertension 05/19/2019  . Hypothyroidism 05/19/2019  . MDD (major depressive disorder), recurrent episode, severe (HCC) 05/18/2019  . Cocaine abuse with cocaine-induced mood disorder (HCC) 01/15/2019    Chief Complaint  Patient presents with  . SEXUALLY TRANSMITTED DISEASE    HPI  Patient reports that she has had a discharge with odor for 1 week.  Denies other symptoms.  Reports that last pap was about 2 weeks ago with PCP when had PE done.  Last HIV test in 12/2019 and LMP 12/27/2019.    See flowsheet for further details and programmatic requirements.    The following portions of the patient's history were reviewed and updated as appropriate: allergies, current medications, past medical history, past social history, past surgical history and problem list.  Objective:  There were no vitals filed for this visit.  Physical Exam Constitutional:      General: She is not in acute distress.    Appearance: Normal appearance.  HENT:     Head: Normocephalic and atraumatic.     Comments: No nits, lice, or hair loss. No cervical, supraclavicular or axillary  adenopathy.    Mouth/Throat:     Mouth: Mucous membranes are moist.     Pharynx: Oropharynx is clear. No oropharyngeal exudate or posterior oropharyngeal erythema.  Eyes:     Conjunctiva/sclera: Conjunctivae normal.  Pulmonary:     Effort: Pulmonary effort is normal.  Abdominal:     Palpations: Abdomen is soft. There is no mass.     Tenderness: There is no abdominal tenderness. There is no guarding or rebound.  Genitourinary:    General: Normal vulva.     Rectum: Normal.     Comments: External genitalia/pubic area without nits, lice, edema, erythema, lesions and inguinal adenopathy. Vagina with normal mucosa and small amount of thin, white discharge, pH=>4.5. Cervix without visible lesions. Uterus firm, mobile, nt, no masses, no CMT, no adnexal tenderness or fullness. Musculoskeletal:     Cervical back: Neck supple. No tenderness.  Skin:    General: Skin is warm and dry.     Findings: No bruising, erythema, lesion or rash.  Neurological:     Mental Status: She is alert and oriented to person, place, and time.  Psychiatric:        Mood and Affect: Mood normal.        Behavior: Behavior normal.        Thought Content: Thought content normal.        Judgment: Judgment normal.      Assessment and Plan:  Katrina Lindsey is a 54 y.o. female presenting to the [facility] Department for STI screening  1. Screening for STD (sexually transmitted disease) Patient into clinic  with symptoms.   Requests recheck for BV only since has not had sex since before last screening in 12/2019. Rec condoms with all sex. - WET PREP FOR TRICH, YEAST, CLUE  2. BV (bacterial vaginosis) Will treat with Metronidazole 500mg  #14 1 po BID for 7 days with food, no EtOH for 24 hr before and until 72 hr after completing medicine. No sex for 7 days. Enc patient to use OTC antifungal cream if has itching during or just after completing antibiotic. - metroNIDAZOLE (FLAGYL) 500 MG tablet; Take 1  tablet (500 mg total) by mouth 2 (two) times daily for 7 days.  Dispense: 14 tablet; Refill: 0     Return if symptoms worsen or fail to improve.  No future appointments.  Jerene Dilling, PA

## 2020-02-17 NOTE — Progress Notes (Signed)
Wet mount reviewed and pt treated for BV per provider order. Provider orders completed.

## 2020-05-08 MED ORDER — PEG 3350-ELECTROLYTES 236 GRAM-22.74 GRAM-6.74 GRAM-5.86 GRAM SOLUTION
0 refills | 0 days | Status: CP
Start: 2020-05-08 — End: ?

## 2020-05-10 MED FILL — PEG 3350-ELECTROLYTES 236 GRAM-22.74 GRAM-6.74 GRAM-5.86 GRAM SOLUTION: 1 days supply | Qty: 4000 | Fill #0 | Status: AC

## 2020-05-10 MED FILL — PEG 3350-ELECTROLYTES 236 GRAM-22.74 GRAM-6.74 GRAM-5.86 GRAM SOLUTION: 1 days supply | Qty: 4000 | Fill #0

## 2020-06-29 ENCOUNTER — Ambulatory Visit: Payer: Self-pay

## 2020-06-30 ENCOUNTER — Ambulatory Visit (LOCAL_COMMUNITY_HEALTH_CENTER): Payer: Self-pay

## 2020-06-30 ENCOUNTER — Ambulatory Visit: Payer: Self-pay | Admitting: Physician Assistant

## 2020-06-30 ENCOUNTER — Other Ambulatory Visit: Payer: Self-pay

## 2020-06-30 DIAGNOSIS — B373 Candidiasis of vulva and vagina: Secondary | ICD-10-CM

## 2020-06-30 DIAGNOSIS — Z113 Encounter for screening for infections with a predominantly sexual mode of transmission: Secondary | ICD-10-CM

## 2020-06-30 DIAGNOSIS — B3731 Acute candidiasis of vulva and vagina: Secondary | ICD-10-CM

## 2020-06-30 DIAGNOSIS — Z23 Encounter for immunization: Secondary | ICD-10-CM

## 2020-06-30 LAB — WET PREP FOR TRICH, YEAST, CLUE: Trichomonas Exam: NEGATIVE

## 2020-06-30 MED ORDER — CLOTRIMAZOLE 1 % VA CREA: 1.0000 | TOPICAL_CREAM | Freq: Every day | VAGINAL | 0 refills | Status: AC

## 2020-06-30 NOTE — Progress Notes (Signed)
Wet mount reviewed by provider, pt treated for yeast per standing order. Provider orders completed.

## 2020-07-02 ENCOUNTER — Encounter: Payer: Self-pay | Admitting: Physician Assistant

## 2020-07-02 NOTE — Progress Notes (Signed)
[facility] Department STI clinic/screening visit  Subjective:  Katrina Lindsey is a 54 y.o. female being seen today for an STI screening visit. The patient reports they do have symptoms.  Patient reports that they do not desire a pregnancy in the next year.   They reported they are not interested in discussing contraception today.  No LMP recorded. (Menstrual status: Perimenopausal).   Patient has the following medical conditions:   Patient Active Problem List   Diagnosis Date Noted  . Smoker 1-2 ppd 12/27/2019  . Moderate episode of recurrent major depressive disorder (HCC)   . MDD (major depressive disorder), severe (HCC) 05/19/2019  . Adjustment disorder with mixed anxiety and depressed mood 05/19/2019  . Schizoaffective disorder, bipolar type (HCC) 05/19/2019  . Diabetes (HCC) 05/19/2019  . Essential hypertension 05/19/2019  . Hypothyroidism 05/19/2019  . MDD (major depressive disorder), recurrent episode, severe (HCC) 05/18/2019  . Cocaine abuse with cocaine-induced mood disorder (HCC) 01/15/2019    Chief Complaint  Patient presents with  . SEXUALLY TRANSMITTED DISEASE    Screening for BV requested    HPI  Patient reports that she has had white discharge and itching for 3-4 days.  Denies any other symptoms.  States that her last HIV test was more than a year ago and her last pap was earlier this year.  States that she has not had a period in about 10 years and had a BTL.   See flowsheet for further details and programmatic requirements.    The following portions of the patient's history were reviewed and updated as appropriate: allergies, current medications, past medical history, past social history, past surgical history and problem list.  Objective:  There were no vitals filed for this visit.  Physical Exam Constitutional:      General: She is not in acute distress.    Appearance: Normal appearance.  HENT:     Head: Normocephalic and atraumatic.      Comments: No nits,lice, or hair loss. No cervical, supraclavicular or axillary adenopathy.    Mouth/Throat:     Mouth: Mucous membranes are moist.     Pharynx: Oropharynx is clear. No oropharyngeal exudate or posterior oropharyngeal erythema.  Eyes:     Conjunctiva/sclera: Conjunctivae normal.  Pulmonary:     Effort: Pulmonary effort is normal.  Abdominal:     Palpations: Abdomen is soft. There is no mass.     Tenderness: There is no abdominal tenderness. There is no guarding or rebound.  Genitourinary:    General: Normal vulva.     Rectum: Normal.     Comments: External genitalia/pubic area without nits, lice, edema, erythema, lesions and inguinal adenopathy. Vagina with normal mucosa and a large amount of thick, white, clumping  discharge. Cervix without visible lesions. Uterus firm, mobile, nt, no masses, no CMT, no adnexal tenderness or fullness. Musculoskeletal:     Cervical back: Neck supple. No tenderness.  Skin:    General: Skin is warm and dry.     Findings: No bruising, erythema, lesion or rash.  Neurological:     Mental Status: She is alert and oriented to person, place, and time.  Psychiatric:        Mood and Affect: Mood normal.        Behavior: Behavior normal.        Thought Content: Thought content normal.        Judgment: Judgment normal.      Assessment and Plan:  Katrina Lindsey is a 54 y.o. female  presenting to the [facility] Department for STI screening  1. Screening for STD (sexually transmitted disease) Patient into clinic with symptoms. Patient declines all testing except for wet mount today.  Rec condoms with all sex. Await test results.  Counseled that RN will call if needs to RTC for treatment once results are back. - WET PREP FOR TRICH, YEAST, CLUE  2. Vaginal candidiasis Treat for yeast with Clotrimazole 1% vaginal cream 1 app qhs for 7 days. - clotrimazole (CLOTRIMAZOLE-7) 1 % vaginal cream; Place 1 Applicatorful vaginally at  bedtime for 7 days.  Dispense: 45 g; Refill: 0     No follow-ups on file.  No future appointments.  Matt Holmes, PA

## 2021-01-18 IMAGING — DX DG CHEST 1V PORT
1 series · 1 of 1 positions shown · non-contrast
Comparison: None.

CLINICAL DATA: Cough and increased breath sounds on the right

EXAM:
PORTABLE CHEST 1 VIEW

[chest ap]
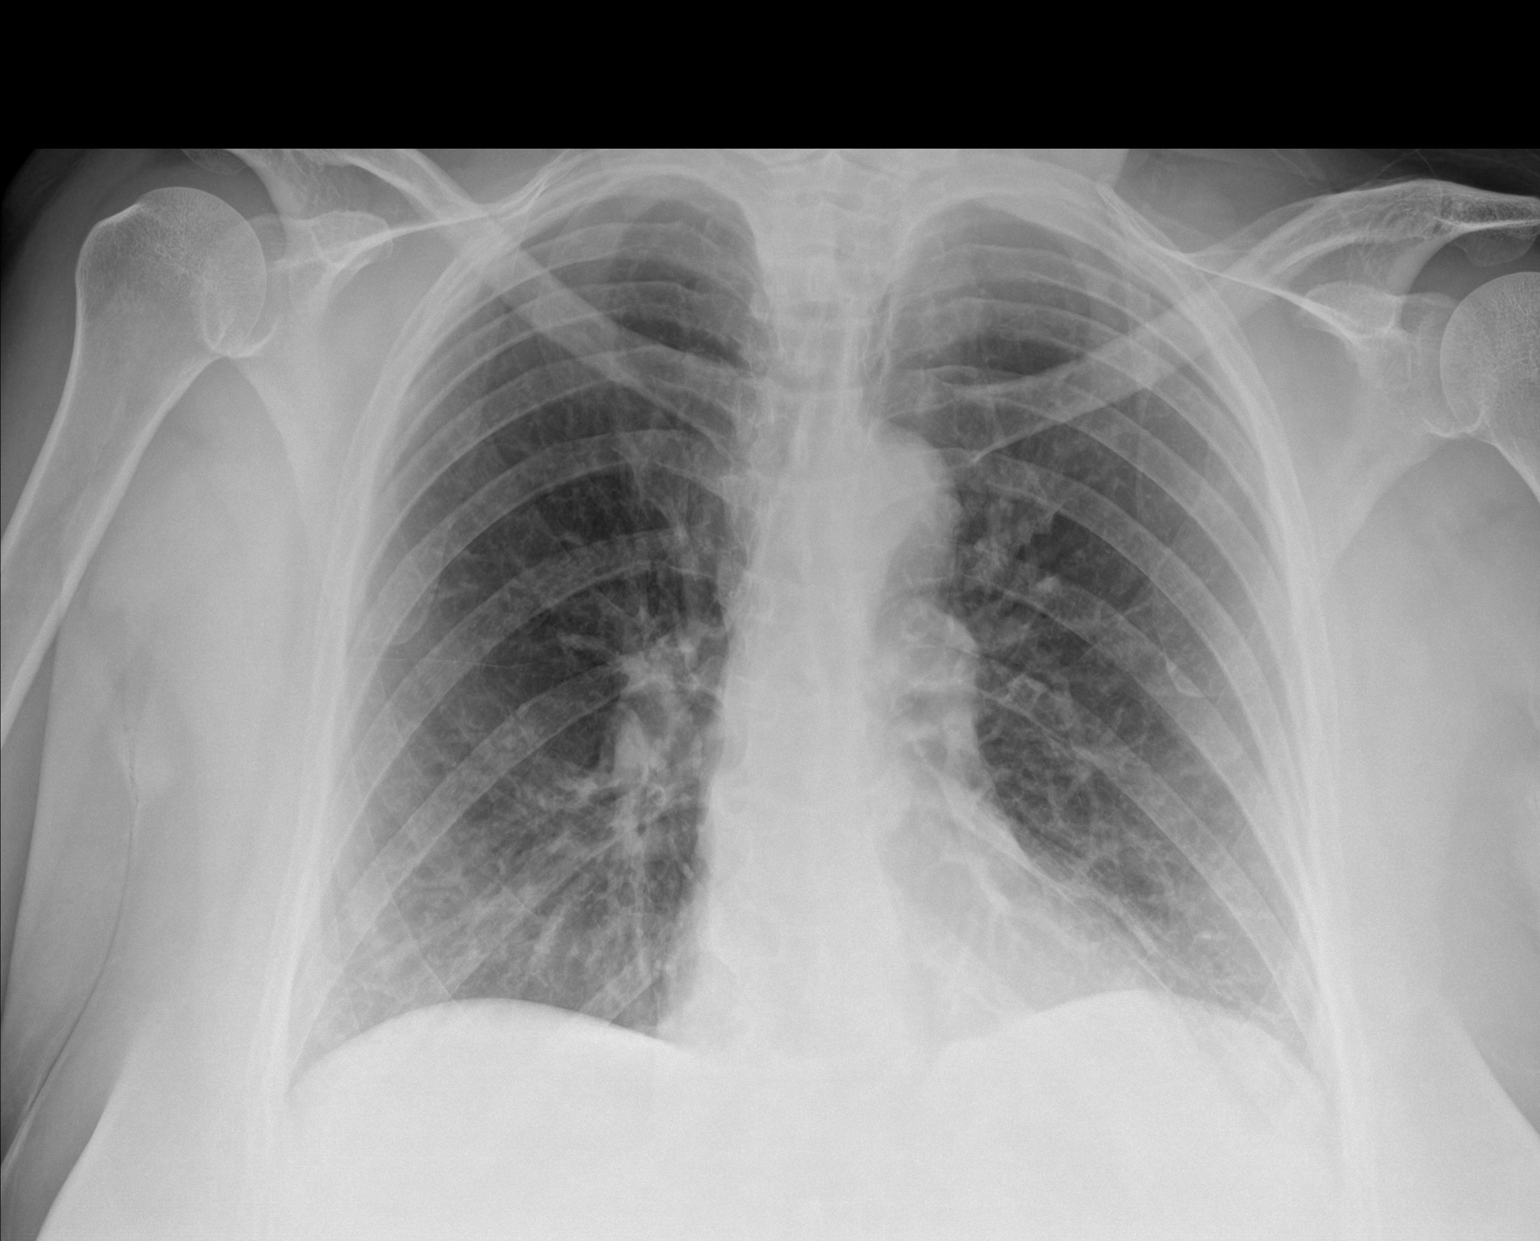

[1 of 1 positions shown; findings below may reference images not displayed]

FINDINGS: The heart size and mediastinal contours are within normal limits.
Both lungs are clear. The visualized skeletal structures are
unremarkable.
IMPRESSION: No active disease.

## 2021-08-05 ENCOUNTER — Ambulatory Visit
Admit: 2021-08-05 | Discharge: 2021-08-05 | Disposition: A | Discharge status: Home / Self Care | Payer: MEDICAID | Attending: Emergency Medicine

## 2021-08-05 DIAGNOSIS — R112 Nausea with vomiting, unspecified: Principal | ICD-10-CM

## 2021-08-05 DIAGNOSIS — R197 Diarrhea, unspecified: Principal | ICD-10-CM

## 2021-09-07 ENCOUNTER — Ambulatory Visit: Admit: 2021-09-07 | Discharge: 2021-09-07 | Disposition: A | Discharge status: Home / Self Care | Payer: MEDICAID

## 2021-09-07 MED ORDER — PROMETHAZINE 25 MG RECTAL SUPPOSITORY
Freq: Four times a day (QID) | RECTAL | 0 refills | 3 days | Status: CP | PRN
Start: 2021-09-07 — End: 2021-09-14

## 2021-09-07 MED ORDER — PROMETHAZINE 25 MG TABLET
ORAL_TABLET | Freq: Four times a day (QID) | ORAL | 0 refills | 8.00000 days | Status: CP | PRN
Start: 2021-09-07 — End: 2021-09-14
  Filled 2021-09-11: qty 30, 8d supply, fill #0

## 2021-09-11 MED FILL — PROMETHEGAN 25 MG RECTAL SUPPOSITORY: RECTAL | 3 days supply | Qty: 12 | Fill #0

## 2021-09-24 ENCOUNTER — Ambulatory Visit
Admit: 2021-09-24 | Discharge: 2021-09-24 | Disposition: A | Discharge status: Home / Self Care | Payer: MEDICAID | Attending: Student in an Organized Health Care Education/Training Program

## 2021-09-24 ENCOUNTER — Emergency Department
Admit: 2021-09-24 | Discharge: 2021-09-24 | Disposition: A | Discharge status: Home / Self Care | Payer: MEDICAID | Attending: Student in an Organized Health Care Education/Training Program

## 2021-09-24 DIAGNOSIS — Z760 Encounter for issue of repeat prescription: Principal | ICD-10-CM

## 2021-09-24 DIAGNOSIS — R0789 Other chest pain: Principal | ICD-10-CM

## 2021-09-24 DIAGNOSIS — J441 Chronic obstructive pulmonary disease with (acute) exacerbation: Principal | ICD-10-CM

## 2021-09-24 MED ORDER — ALBUTEROL SULFATE HFA 90 MCG/ACTUATION AEROSOL INHALER
RESPIRATORY_TRACT | 0 refills | 0 days | Status: CP | PRN
Start: 2021-09-24 — End: ?

## 2021-09-24 MED ORDER — ADVAIR DISKUS 500 MCG-50 MCG/DOSE POWDER FOR INHALATION
Freq: Two times a day (BID) | RESPIRATORY_TRACT | 0 refills | 7.00000 days | Status: CP
Start: 2021-09-24 — End: 2021-09-24

## 2021-09-24 MED ORDER — BUPROPION HCL 75 MG TABLET
ORAL_TABLET | Freq: Two times a day (BID) | ORAL | 0 refills | 7.00000 days | Status: CP
Start: 2021-09-24 — End: 2021-09-24

## 2021-09-24 MED ORDER — PREDNISONE 20 MG TABLET
ORAL_TABLET | ORAL | 0 refills | 0.00000 days | Status: CP
Start: 2021-09-24 — End: 2021-09-24

## 2021-09-25 MED ORDER — PREDNISONE 20 MG TABLET
ORAL_TABLET | 0 refills | 6 days | Status: CP
Start: 2021-09-25 — End: 2021-10-01
  Filled 2021-09-26: qty 12, 6d supply, fill #0

## 2021-09-25 MED ORDER — BUPROPION HCL 75 MG TABLET
ORAL_TABLET | Freq: Two times a day (BID) | ORAL | 0 refills | 7 days | Status: CP
Start: 2021-09-25 — End: 2021-10-02
  Filled 2021-09-26: qty 28, 7d supply, fill #0

## 2021-09-25 MED ORDER — ADVAIR DISKUS 500 MCG-50 MCG/DOSE POWDER FOR INHALATION
Freq: Two times a day (BID) | RESPIRATORY_TRACT | 0 refills | 30 days | Status: CP
Start: 2021-09-25 — End: ?

## 2021-09-25 MED ORDER — ALBUTEROL SULFATE HFA 90 MCG/ACTUATION AEROSOL INHALER
RESPIRATORY_TRACT | 0 refills | 0 days | Status: CP | PRN
Start: 2021-09-25 — End: ?
  Filled 2021-09-26: qty 18, 17d supply, fill #0

## 2021-11-05 ENCOUNTER — Ambulatory Visit
Admit: 2021-11-05 | Discharge: 2021-11-05 | Disposition: A | Discharge status: Home / Self Care | Payer: MEDICAID | Attending: Emergency Medicine

## 2021-11-05 ENCOUNTER — Emergency Department
Admit: 2021-11-05 | Discharge: 2021-11-05 | Disposition: A | Discharge status: Home / Self Care | Payer: MEDICAID | Attending: Emergency Medicine

## 2021-11-05 DIAGNOSIS — R252 Cramp and spasm: Principal | ICD-10-CM

## 2021-11-05 DIAGNOSIS — R739 Hyperglycemia, unspecified: Principal | ICD-10-CM

## 2021-11-05 MED ORDER — DIAZEPAM 5 MG TABLET
ORAL_TABLET | Freq: Two times a day (BID) | ORAL | 0 refills | 4.00000 days | Status: CP | PRN
Start: 2021-11-05 — End: 2021-11-05
  Filled 2021-11-05: qty 8, 4d supply, fill #0

## 2021-11-26 ENCOUNTER — Ambulatory Visit: Admit: 2021-11-26 | Discharge: 2021-11-26 | Discharge status: Against Medical Advice | Payer: MEDICAID

## 2021-12-28 ENCOUNTER — Ambulatory Visit
Admit: 2021-12-28 | Discharge: 2021-12-29 | Attending: Student in an Organized Health Care Education/Training Program | Primary: Student in an Organized Health Care Education/Training Program

## 2021-12-28 MED ORDER — BACLOFEN 10 MG TABLET
ORAL_TABLET | Freq: Every day | ORAL | 1 refills | 30 days | Status: CP
Start: 2021-12-28 — End: 2022-12-28
  Filled 2021-12-31: qty 30, 30d supply, fill #0

## 2021-12-28 MED ORDER — ALBUTEROL SULFATE HFA 90 MCG/ACTUATION AEROSOL INHALER
RESPIRATORY_TRACT | 0 refills | 17 days | Status: CP | PRN
Start: 2021-12-28 — End: ?
  Filled 2021-12-31: qty 18, 17d supply, fill #0

## 2021-12-28 MED ORDER — MAGNESIUM OXIDE 400 MG (241.3 MG MAGNESIUM) TABLET
ORAL_TABLET | Freq: Every day | ORAL | 0 refills | 90 days | Status: CP
Start: 2021-12-28 — End: 2022-03-28
  Filled 2021-12-31: qty 90, 90d supply, fill #0

## 2021-12-28 MED ORDER — VARENICLINE 1 MG TABLET
ORAL_TABLET | Freq: Every day | ORAL | 0 refills | 30 days | Status: CP
Start: 2021-12-28 — End: 2022-01-27

## 2021-12-28 MED ORDER — HUMALOG MIX 75-25 KWIKPEN U-100 INSULIN 100 UNIT/ML SUBCUTANEOUS PEN
Freq: Two times a day (BID) | SUBCUTANEOUS | 3 refills | 90 days | Status: CP
Start: 2021-12-28 — End: 2022-03-28

## 2021-12-31 ENCOUNTER — Ambulatory Visit: Admit: 2021-12-31 | Attending: Ambulatory Care | Primary: Ambulatory Care

## 2021-12-31 DIAGNOSIS — E1165 Type 2 diabetes mellitus with hyperglycemia: Principal | ICD-10-CM

## 2021-12-31 MED ORDER — INSULIN ASPAR PROT-INSULIN ASPART 100 UNIT/ML (70-30) SUBCUTANEOUS PEN
Freq: Two times a day (BID) | SUBCUTANEOUS | 3 refills | 90.00000 days | Status: CP
Start: 2021-12-31 — End: 2021-12-31
  Filled 2022-01-01: qty 60, 86d supply, fill #0

## 2021-12-31 MED FILL — VARENICLINE 1 MG TABLET: ORAL | 30 days supply | Qty: 30 | Fill #0

## 2022-01-01 ENCOUNTER — Ambulatory Visit: Admit: 2022-01-01 | Discharge: 2022-01-01 | Discharge status: Against Medical Advice | Payer: MEDICAID

## 2022-01-07 ENCOUNTER — Institutional Professional Consult (permissible substitution): Admit: 2022-01-07 | Discharge: 2022-01-08 | Attending: Ambulatory Care | Primary: Ambulatory Care

## 2022-01-07 MED ORDER — INSULIN ASPART (U-100) 100 UNIT/ML (3 ML) SUBCUTANEOUS PEN
Freq: Two times a day (BID) | SUBCUTANEOUS | 8 refills | 75 days | Status: CP
Start: 2022-01-07 — End: 2023-01-07
  Filled 2022-01-07: qty 15, 75d supply, fill #0

## 2022-01-07 MED ORDER — INSULIN GLARGINE (U-100) 100 UNIT/ML (3 ML) SUBCUTANEOUS PEN
Freq: Every evening | SUBCUTANEOUS | 11 refills | 26 days | Status: CP
Start: 2022-01-07 — End: 2023-01-07
  Filled 2022-01-07: qty 15, 26d supply, fill #0

## 2022-01-07 MED ORDER — AMLODIPINE 10 MG TABLET
ORAL_TABLET | Freq: Every day | ORAL | 0 refills | 90 days | Status: CP
Start: 2022-01-07 — End: ?
  Filled 2022-01-07: qty 30, 30d supply, fill #0

## 2022-01-07 MED ORDER — LISINOPRIL 20 MG TABLET
ORAL_TABLET | Freq: Every day | ORAL | 0 refills | 90 days | Status: CP
Start: 2022-01-07 — End: ?
  Filled 2022-01-07: qty 30, 30d supply, fill #0

## 2022-01-10 DIAGNOSIS — F250 Schizoaffective disorder, bipolar type: Principal | ICD-10-CM

## 2022-01-16 DIAGNOSIS — J449 Chronic obstructive pulmonary disease, unspecified: Principal | ICD-10-CM

## 2022-01-16 MED ORDER — ALBUTEROL SULFATE HFA 90 MCG/ACTUATION AEROSOL INHALER
RESPIRATORY_TRACT | 2 refills | 17 days | Status: CP | PRN
Start: 2022-01-16 — End: ?
  Filled 2022-01-18: qty 18, 17d supply, fill #0

## 2022-01-17 ENCOUNTER — Ambulatory Visit
Admit: 2022-01-17 | Discharge: 2022-01-18 | Attending: Student in an Organized Health Care Education/Training Program | Primary: Student in an Organized Health Care Education/Training Program

## 2022-01-17 MED ORDER — UMECLIDINIUM 62.5 MCG-VILANTEROL 25 MCG/ACTUATION POWDR FOR INHALATION
Freq: Every day | RESPIRATORY_TRACT | 3 refills | 90 days | Status: CP
Start: 2022-01-17 — End: 2023-01-17

## 2022-01-17 MED ORDER — ALBUTEROL SULFATE HFA 90 MCG/ACTUATION AEROSOL INHALER
RESPIRATORY_TRACT | 2 refills | 17 days | Status: CP | PRN
Start: 2022-01-17 — End: ?

## 2022-01-17 MED ORDER — CHLORHEXIDINE GLUCONATE 0.12 % MOUTHWASH
Freq: Two times a day (BID) | OROMUCOSAL | 0 refills | 79 days | Status: CP
Start: 2022-01-17 — End: 2022-04-17
  Filled 2022-01-18: qty 2365, 79d supply, fill #0

## 2022-01-17 MED ORDER — OPTICHAMBER(AEROCHAMBER) ADULT
0 refills | 1 days | Status: CP | PRN
Start: 2022-01-17 — End: ?

## 2022-01-18 MED FILL — ANORO ELLIPTA 62.5 MCG-25 MCG/ACTUATION POWDER FOR INHALATION: RESPIRATORY_TRACT | 30 days supply | Qty: 60 | Fill #0

## 2022-01-18 MED FILL — OPTICHAMBER DIAMOND VHC SPACER: 1 days supply | Qty: 1 | Fill #0

## 2022-01-28 ENCOUNTER — Ambulatory Visit: Admit: 2022-01-28 | Attending: Ambulatory Care | Primary: Ambulatory Care

## 2022-01-29 MED FILL — NOVOLOG FLEXPEN U-100 INSULIN ASPART 100 UNIT/ML (3 ML) SUBCUTANEOUS: SUBCUTANEOUS | 75 days supply | Qty: 15 | Fill #1

## 2022-01-29 MED FILL — INSULIN GLARGINE (U-100) 100 UNIT/ML (3 ML) SUBCUTANEOUS PEN: SUBCUTANEOUS | 26 days supply | Qty: 15 | Fill #1

## 2022-02-01 MED FILL — ALBUTEROL SULFATE HFA 90 MCG/ACTUATION AEROSOL INHALER: RESPIRATORY_TRACT | 17 days supply | Qty: 18 | Fill #0

## 2022-02-01 MED FILL — BACLOFEN 10 MG TABLET: ORAL | 30 days supply | Qty: 30 | Fill #1

## 2022-02-01 MED FILL — LISINOPRIL 20 MG TABLET: ORAL | 30 days supply | Qty: 30 | Fill #0

## 2022-02-01 MED FILL — AMLODIPINE 10 MG TABLET: ORAL | 30 days supply | Qty: 30 | Fill #0

## 2022-02-08 ENCOUNTER — Ambulatory Visit
Admit: 2022-02-08 | Attending: Student in an Organized Health Care Education/Training Program | Primary: Student in an Organized Health Care Education/Training Program

## 2022-02-12 DIAGNOSIS — M62838 Other muscle spasm: Principal | ICD-10-CM

## 2022-02-12 MED ORDER — ACETAMINOPHEN 500 MG TABLET
ORAL_TABLET | Freq: Three times a day (TID) | ORAL | 11 refills | 34 days | Status: CP
Start: 2022-02-12 — End: 2023-02-12
  Filled 2022-02-13: qty 200, 34d supply, fill #0

## 2022-02-12 MED ORDER — DICLOFENAC 1 % TOPICAL GEL
Freq: Four times a day (QID) | TOPICAL | 6 refills | 25 days | Status: CP
Start: 2022-02-12 — End: 2023-02-12
  Filled 2022-02-13: qty 200, 25d supply, fill #0

## 2022-02-12 MED ORDER — BACLOFEN 10 MG TABLET
ORAL_TABLET | Freq: Every day | ORAL | 1 refills | 30 days | Status: CP
Start: 2022-02-12 — End: 2023-02-12
  Filled 2022-02-13: qty 30, 30d supply, fill #0

## 2022-02-15 MED FILL — ALBUTEROL SULFATE HFA 90 MCG/ACTUATION AEROSOL INHALER: RESPIRATORY_TRACT | 17 days supply | Qty: 18 | Fill #0

## 2022-02-23 DIAGNOSIS — R531 Weakness: Principal | ICD-10-CM

## 2022-02-24 ENCOUNTER — Emergency Department: Admit: 2022-02-24 | Discharge: 2022-02-24 | Disposition: A | Discharge status: Home / Self Care | Payer: MEDICAID

## 2022-02-24 ENCOUNTER — Ambulatory Visit: Admit: 2022-02-24 | Discharge: 2022-02-24 | Disposition: A | Discharge status: Home / Self Care | Payer: MEDICAID

## 2022-02-25 ENCOUNTER — Ambulatory Visit: Admit: 2022-02-25 | Discharge: 2022-02-26

## 2022-02-25 ENCOUNTER — Ambulatory Visit
Admit: 2022-02-25 | Discharge: 2022-02-26 | Attending: Student in an Organized Health Care Education/Training Program | Primary: Student in an Organized Health Care Education/Training Program

## 2022-02-25 MED ORDER — DICLOFENAC SODIUM 50 MG TABLET,DELAYED RELEASE
ORAL_TABLET | Freq: Two times a day (BID) | ORAL | 11 refills | 30 days | Status: CP
Start: 2022-02-25 — End: 2023-02-25
  Filled 2022-02-25: qty 60, 30d supply, fill #0

## 2022-02-25 MED ORDER — PREGABALIN 50 MG CAPSULE
ORAL_CAPSULE | Freq: Three times a day (TID) | ORAL | 2 refills | 30.00000 days | Status: CP
Start: 2022-02-25 — End: 2023-02-25
  Filled 2022-02-25: qty 90, 30d supply, fill #0

## 2022-02-25 MED ORDER — DICLOFENAC ER 100 MG TABLET,EXTENDED RELEASE 24 HR
ORAL_TABLET | Freq: Every day | ORAL | 11 refills | 30.00000 days | Status: CP
Start: 2022-02-25 — End: 2022-02-25

## 2022-02-26 ENCOUNTER — Institutional Professional Consult (permissible substitution): Admit: 2022-02-26 | Discharge: 2022-02-27

## 2022-02-26 DIAGNOSIS — R748 Abnormal levels of other serum enzymes: Principal | ICD-10-CM

## 2022-02-26 MED FILL — INSULIN GLARGINE (U-100) 100 UNIT/ML (3 ML) SUBCUTANEOUS PEN: SUBCUTANEOUS | 26 days supply | Qty: 15 | Fill #0

## 2022-02-27 ENCOUNTER — Ambulatory Visit: Admit: 2022-02-27 | Discharge: 2022-02-28

## 2022-02-27 DIAGNOSIS — M545 Chronic bilateral low back pain without sciatica: Principal | ICD-10-CM

## 2022-02-27 DIAGNOSIS — G8929 Other chronic pain: Principal | ICD-10-CM

## 2022-02-27 DIAGNOSIS — R748 Abnormal levels of other serum enzymes: Principal | ICD-10-CM

## 2022-02-27 MED ORDER — NALOXONE 4 MG/ACTUATION NASAL SPRAY
11 refills | 0 days | Status: CP
Start: 2022-02-27 — End: 2023-02-27

## 2022-02-27 MED ORDER — CYCLOBENZAPRINE 5 MG TABLET
ORAL_TABLET | Freq: Three times a day (TID) | ORAL | 0 refills | 90 days | Status: CP
Start: 2022-02-27 — End: 2022-05-28
  Filled 2022-02-27: qty 90, 30d supply, fill #0

## 2022-02-27 MED ORDER — OXYCODONE 5 MG TABLET
ORAL_TABLET | Freq: Three times a day (TID) | ORAL | 0 refills | 4 days | Status: CP | PRN
Start: 2022-02-27 — End: ?
  Filled 2022-02-27: qty 10, 4d supply, fill #0

## 2022-02-27 MED FILL — ANORO ELLIPTA 62.5 MCG-25 MCG/ACTUATION POWDER FOR INHALATION: RESPIRATORY_TRACT | 30 days supply | Qty: 60 | Fill #1

## 2022-03-04 ENCOUNTER — Institutional Professional Consult (permissible substitution): Admit: 2022-03-04 | Discharge: 2022-03-05 | Attending: Ambulatory Care | Primary: Ambulatory Care

## 2022-03-04 MED ORDER — DULOXETINE 30 MG CAPSULE,DELAYED RELEASE
ORAL_CAPSULE | Freq: Every day | ORAL | 2 refills | 30 days | Status: CP
Start: 2022-03-04 — End: 2023-03-04
  Filled 2022-03-05: qty 30, 30d supply, fill #0

## 2022-03-05 MED FILL — AMLODIPINE 10 MG TABLET: ORAL | 30 days supply | Qty: 30 | Fill #1

## 2022-03-05 MED FILL — LISINOPRIL 20 MG TABLET: ORAL | 30 days supply | Qty: 30 | Fill #1

## 2022-03-06 ENCOUNTER — Ambulatory Visit: Admit: 2022-03-06 | Discharge: 2022-03-07

## 2022-03-08 MED FILL — DICLOFENAC 1 % TOPICAL GEL: TOPICAL | 25 days supply | Qty: 200 | Fill #1

## 2022-03-11 DIAGNOSIS — J449 Chronic obstructive pulmonary disease, unspecified: Principal | ICD-10-CM

## 2022-03-13 ENCOUNTER — Ambulatory Visit
Admit: 2022-03-13 | Discharge: 2022-03-14 | Attending: Student in an Organized Health Care Education/Training Program | Primary: Student in an Organized Health Care Education/Training Program

## 2022-03-13 MED ORDER — DICLOFENAC SODIUM 75 MG TABLET,DELAYED RELEASE
ORAL_TABLET | Freq: Two times a day (BID) | ORAL | 3 refills | 30 days | Status: CP
Start: 2022-03-13 — End: 2023-03-13

## 2022-03-13 MED ORDER — OXYCODONE 5 MG TABLET
ORAL_TABLET | ORAL | 0 refills | 2 days | Status: CP | PRN
Start: 2022-03-13 — End: ?
  Filled 2022-03-13: qty 10, 2d supply, fill #0

## 2022-03-13 MED ORDER — EZETIMIBE 10 MG TABLET
ORAL_TABLET | Freq: Every day | ORAL | 3 refills | 90 days | Status: CP
Start: 2022-03-13 — End: 2023-03-13
  Filled 2022-03-13: qty 30, 30d supply, fill #0

## 2022-03-13 MED ORDER — CYCLOBENZAPRINE 5 MG TABLET
ORAL_TABLET | Freq: Three times a day (TID) | ORAL | 0 refills | 90 days | Status: CP
Start: 2022-03-13 — End: 2022-06-11
  Filled 2022-03-26: qty 270, 90d supply, fill #0

## 2022-03-15 ENCOUNTER — Ambulatory Visit: Admit: 2022-03-15 | Attending: Family Medicine | Primary: Family Medicine

## 2022-03-15 DIAGNOSIS — J449 Chronic obstructive pulmonary disease, unspecified: Principal | ICD-10-CM

## 2022-03-15 DIAGNOSIS — M62838 Other muscle spasm: Principal | ICD-10-CM

## 2022-03-15 MED ORDER — ALBUTEROL SULFATE HFA 90 MCG/ACTUATION AEROSOL INHALER
RESPIRATORY_TRACT | 2 refills | 17 days | Status: CP | PRN
Start: 2022-03-15 — End: ?
  Filled 2022-03-18: qty 18, 17d supply, fill #0

## 2022-03-15 MED ORDER — BACLOFEN 10 MG TABLET
ORAL_TABLET | Freq: Every day | ORAL | 1 refills | 30 days | Status: CP
Start: 2022-03-15 — End: 2023-03-15
  Filled 2022-03-18: qty 30, 30d supply, fill #0

## 2022-03-25 ENCOUNTER — Ambulatory Visit: Admit: 2022-03-25 | Discharge: 2022-03-26

## 2022-03-25 DIAGNOSIS — J449 Chronic obstructive pulmonary disease, unspecified: Principal | ICD-10-CM

## 2022-03-25 MED ORDER — ALBUTEROL SULFATE HFA 90 MCG/ACTUATION AEROSOL INHALER
RESPIRATORY_TRACT | 2 refills | 17 days | Status: CP | PRN
Start: 2022-03-25 — End: ?

## 2022-03-26 ENCOUNTER — Ambulatory Visit: Admit: 2022-03-26 | Discharge: 2022-03-26 | Discharge status: Against Medical Advice | Payer: MEDICAID

## 2022-03-26 MED FILL — ANORO ELLIPTA 62.5 MCG-25 MCG/ACTUATION POWDER FOR INHALATION: RESPIRATORY_TRACT | 30 days supply | Qty: 60 | Fill #0

## 2022-03-26 MED FILL — ACETAMINOPHEN 500 MG TABLET: ORAL | 34 days supply | Qty: 200 | Fill #1

## 2022-03-26 MED FILL — PREGABALIN 50 MG CAPSULE: ORAL | 30 days supply | Qty: 90 | Fill #0

## 2022-03-26 MED FILL — INSULIN GLARGINE (U-100) 100 UNIT/ML (3 ML) SUBCUTANEOUS PEN: SUBCUTANEOUS | 80 days supply | Qty: 45 | Fill #1

## 2022-03-27 ENCOUNTER — Ambulatory Visit: Admit: 2022-03-27 | Discharge: 2022-03-27

## 2022-03-27 ENCOUNTER — Institutional Professional Consult (permissible substitution): Admit: 2022-03-27 | Discharge: 2022-03-27 | Attending: Ambulatory Care | Primary: Ambulatory Care

## 2022-03-27 DIAGNOSIS — G8929 Other chronic pain: Principal | ICD-10-CM

## 2022-03-27 DIAGNOSIS — M545 Chronic bilateral low back pain without sciatica: Principal | ICD-10-CM

## 2022-03-27 DIAGNOSIS — M2550 Pain in unspecified joint: Principal | ICD-10-CM

## 2022-03-27 MED ORDER — TRULICITY 0.75 MG/0.5 ML SUBCUTANEOUS PEN INJECTOR
SUBCUTANEOUS | 0 refills | 28 days | Status: CP
Start: 2022-03-27 — End: 2022-04-18
  Filled 2022-03-28: qty 2, 28d supply, fill #0

## 2022-03-27 MED ORDER — VARENICLINE 1 MG TABLET
ORAL_TABLET | Freq: Two times a day (BID) | ORAL | 1 refills | 90 days | Status: CP
Start: 2022-03-27 — End: 2022-06-25
  Filled 2022-03-28: qty 60, 30d supply, fill #0

## 2022-03-28 DIAGNOSIS — M2550 Pain in unspecified joint: Principal | ICD-10-CM

## 2022-03-28 DIAGNOSIS — K029 Dental caries, unspecified: Principal | ICD-10-CM

## 2022-03-28 DIAGNOSIS — G8929 Other chronic pain: Principal | ICD-10-CM

## 2022-03-28 DIAGNOSIS — J449 Chronic obstructive pulmonary disease, unspecified: Principal | ICD-10-CM

## 2022-03-28 DIAGNOSIS — M545 Chronic bilateral low back pain without sciatica: Principal | ICD-10-CM

## 2022-03-28 MED ORDER — DICLOFENAC SODIUM 75 MG TABLET,DELAYED RELEASE
ORAL_TABLET | Freq: Two times a day (BID) | ORAL | 3 refills | 30 days | Status: CP
Start: 2022-03-28 — End: 2023-03-28
  Filled 2022-04-01: qty 60, 30d supply, fill #0

## 2022-03-28 MED ORDER — ALBUTEROL SULFATE HFA 90 MCG/ACTUATION AEROSOL INHALER
RESPIRATORY_TRACT | 2 refills | 17 days | Status: CP | PRN
Start: 2022-03-28 — End: ?
  Filled 2022-03-29: qty 18, 17d supply, fill #0

## 2022-03-31 MED ORDER — KETOROLAC 10 MG TABLET
ORAL_TABLET | Freq: Four times a day (QID) | ORAL | 0 refills | 5 days | Status: CP | PRN
Start: 2022-03-31 — End: 2022-04-05
  Filled 2022-04-03: qty 20, 5d supply, fill #0

## 2022-04-01 ENCOUNTER — Emergency Department: Admit: 2022-04-01 | Discharge: 2022-04-01 | Disposition: A | Discharge status: Home / Self Care | Payer: MEDICAID

## 2022-04-01 ENCOUNTER — Ambulatory Visit: Admit: 2022-04-01 | Discharge: 2022-04-01 | Disposition: A | Discharge status: Home / Self Care | Payer: MEDICAID

## 2022-04-01 ENCOUNTER — Ambulatory Visit: Admit: 2022-04-01 | Discharge: 2022-04-02 | Disposition: A | Discharge status: Home / Self Care | Payer: MEDICAID

## 2022-04-01 DIAGNOSIS — E860 Dehydration: Principal | ICD-10-CM

## 2022-04-01 MED ORDER — MAGNESIUM OXIDE 400 MG (241.3 MG MAGNESIUM) TABLET
ORAL_TABLET | Freq: Every day | ORAL | 11 refills | 30 days | Status: CP
Start: 2022-04-01 — End: 2023-04-01
  Filled 2022-04-03: qty 30, 30d supply, fill #0

## 2022-04-02 MED ORDER — LISINOPRIL 20 MG TABLET
ORAL_TABLET | Freq: Every day | ORAL | 3 refills | 90 days | Status: CP
Start: 2022-04-02 — End: ?
  Filled 2022-04-03: qty 90, 90d supply, fill #0

## 2022-04-02 MED ORDER — AMLODIPINE 10 MG TABLET
ORAL_TABLET | Freq: Every day | ORAL | 3 refills | 90 days | Status: CP
Start: 2022-04-02 — End: ?
  Filled 2022-04-03: qty 90, 90d supply, fill #0

## 2022-04-03 ENCOUNTER — Ambulatory Visit: Admit: 2022-04-03 | Discharge: 2022-04-04

## 2022-04-03 MED ORDER — OXYCODONE 5 MG TABLET
ORAL_TABLET | ORAL | 0 refills | 4.00000 days | Status: CP | PRN
Start: 2022-04-03 — End: 2022-04-03
  Filled 2022-04-03: qty 20, 4d supply, fill #0

## 2022-04-03 MED FILL — DULOXETINE 30 MG CAPSULE,DELAYED RELEASE: ORAL | 30 days supply | Qty: 30 | Fill #1

## 2022-04-03 MED FILL — DICLOFENAC 1 % TOPICAL GEL: TOPICAL | 25 days supply | Qty: 200 | Fill #2

## 2022-04-04 ENCOUNTER — Ambulatory Visit: Admit: 2022-04-04 | Discharge: 2022-04-05

## 2022-04-04 DIAGNOSIS — M545 Chronic bilateral low back pain without sciatica: Principal | ICD-10-CM

## 2022-04-04 DIAGNOSIS — G8929 Other chronic pain: Principal | ICD-10-CM

## 2022-04-09 ENCOUNTER — Ambulatory Visit: Admit: 2022-04-09 | Discharge: 2022-04-10

## 2022-04-09 DIAGNOSIS — G8929 Other chronic pain: Principal | ICD-10-CM

## 2022-04-09 DIAGNOSIS — M545 Chronic bilateral low back pain without sciatica: Principal | ICD-10-CM

## 2022-04-11 ENCOUNTER — Ambulatory Visit
Admit: 2022-04-11 | Discharge: 2022-04-12 | Attending: Student in an Organized Health Care Education/Training Program | Primary: Student in an Organized Health Care Education/Training Program

## 2022-04-11 MED ORDER — OXYCODONE 5 MG TABLET
ORAL_TABLET | Freq: Four times a day (QID) | ORAL | 0 refills | 30 days | Status: CP | PRN
Start: 2022-04-11 — End: 2022-05-11
  Filled 2022-04-11: qty 120, 30d supply, fill #0

## 2022-04-11 MED ORDER — CARBOXYMETHYLCELLULOSE SODIUM 0.5 % EYE DROPS
Freq: Four times a day (QID) | OPHTHALMIC | 3 refills | 75 days | Status: CP | PRN
Start: 2022-04-11 — End: ?

## 2022-04-11 MED ORDER — PREGABALIN 75 MG CAPSULE
ORAL_CAPSULE | Freq: Three times a day (TID) | ORAL | 2 refills | 30 days | Status: CP
Start: 2022-04-11 — End: 2023-04-11
  Filled 2022-04-25: qty 90, 30d supply, fill #0

## 2022-04-11 MED ORDER — ONDANSETRON HCL 4 MG TABLET
ORAL_TABLET | Freq: Every day | ORAL | 1 refills | 30 days | Status: CP | PRN
Start: 2022-04-11 — End: 2023-04-11
  Filled 2022-04-11: qty 30, 30d supply, fill #0

## 2022-04-12 MED FILL — ALBUTEROL SULFATE HFA 90 MCG/ACTUATION AEROSOL INHALER: RESPIRATORY_TRACT | 17 days supply | Qty: 18 | Fill #0

## 2022-04-22 ENCOUNTER — Ambulatory Visit: Admit: 2022-04-22 | Discharge: 2022-04-23 | Payer: MEDICAID | Attending: Internal Medicine | Primary: Internal Medicine

## 2022-04-24 MED ORDER — TRULICITY 1.5 MG/0.5 ML SUBCUTANEOUS PEN INJECTOR
SUBCUTANEOUS | 0 refills | 28 days | Status: CP
Start: 2022-04-24 — End: 2022-05-16
  Filled 2022-04-25: qty 2, 28d supply, fill #0

## 2022-04-25 DIAGNOSIS — J449 Chronic obstructive pulmonary disease, unspecified: Principal | ICD-10-CM

## 2022-04-25 MED FILL — EZETIMIBE 10 MG TABLET: ORAL | 90 days supply | Qty: 90 | Fill #0

## 2022-04-25 MED FILL — VARENICLINE 1 MG TABLET: ORAL | 30 days supply | Qty: 60 | Fill #1

## 2022-04-25 MED FILL — DICLOFENAC SODIUM 75 MG TABLET,DELAYED RELEASE: ORAL | 90 days supply | Qty: 180 | Fill #1

## 2022-04-25 MED FILL — ALBUTEROL SULFATE HFA 90 MCG/ACTUATION AEROSOL INHALER: RESPIRATORY_TRACT | 17 days supply | Qty: 18 | Fill #1

## 2022-04-26 ENCOUNTER — Ambulatory Visit: Admit: 2022-04-26 | Discharge: 2022-04-27 | Payer: MEDICAID | Attending: Internal Medicine | Primary: Internal Medicine

## 2022-04-26 DIAGNOSIS — M25562 Pain in left knee: Principal | ICD-10-CM

## 2022-04-29 ENCOUNTER — Ambulatory Visit: Admit: 2022-04-29 | Payer: MEDICAID

## 2022-04-29 MED FILL — DULOXETINE 30 MG CAPSULE,DELAYED RELEASE: ORAL | 30 days supply | Qty: 30 | Fill #2

## 2022-04-29 MED FILL — DICLOFENAC 1 % TOPICAL GEL: TOPICAL | 25 days supply | Qty: 200 | Fill #3

## 2022-04-29 MED FILL — MAGNESIUM OXIDE 400 MG (241.3 MG MAGNESIUM) TABLET: ORAL | 30 days supply | Qty: 30 | Fill #1

## 2022-05-03 ENCOUNTER — Ambulatory Visit: Admit: 2022-05-03 | Discharge: 2022-05-04 | Payer: MEDICAID

## 2022-05-06 DIAGNOSIS — G8929 Other chronic pain: Principal | ICD-10-CM

## 2022-05-06 DIAGNOSIS — M48062 Spinal stenosis, lumbar region with neurogenic claudication: Principal | ICD-10-CM

## 2022-05-06 DIAGNOSIS — M25562 Pain in left knee: Principal | ICD-10-CM

## 2022-05-06 DIAGNOSIS — M545 Chronic bilateral low back pain without sciatica: Principal | ICD-10-CM

## 2022-05-06 DIAGNOSIS — G379 Demyelinating disease of central nervous system, unspecified: Principal | ICD-10-CM

## 2022-05-06 DIAGNOSIS — M542 Cervicalgia: Principal | ICD-10-CM

## 2022-05-06 MED ORDER — OXYCODONE 5 MG TABLET
ORAL_TABLET | Freq: Four times a day (QID) | ORAL | 0 refills | 14 days | Status: CP | PRN
Start: 2022-05-06 — End: 2022-05-20
  Filled 2022-05-06: qty 56, 14d supply, fill #0

## 2022-05-07 DIAGNOSIS — J449 Chronic obstructive pulmonary disease, unspecified: Principal | ICD-10-CM

## 2022-05-07 MED ORDER — ALBUTEROL SULFATE HFA 90 MCG/ACTUATION AEROSOL INHALER
RESPIRATORY_TRACT | 2 refills | 17 days | Status: CP | PRN
Start: 2022-05-07 — End: ?
  Filled 2022-05-09: qty 18, 17d supply, fill #0

## 2022-05-08 ENCOUNTER — Ambulatory Visit: Admit: 2022-05-08 | Discharge: 2022-05-09 | Payer: MEDICAID

## 2022-05-09 ENCOUNTER — Ambulatory Visit: Admit: 2022-05-09 | Discharge: 2022-05-10 | Attending: Family Medicine | Primary: Family Medicine

## 2022-05-09 MED FILL — ACETAMINOPHEN 500 MG TABLET: ORAL | 34 days supply | Qty: 200 | Fill #2

## 2022-05-09 MED FILL — NOVOLOG FLEXPEN U-100 INSULIN ASPART 100 UNIT/ML (3 ML) SUBCUTANEOUS: SUBCUTANEOUS | 75 days supply | Qty: 15 | Fill #0

## 2022-05-09 MED FILL — ONDANSETRON HCL 4 MG TABLET: ORAL | 30 days supply | Qty: 30 | Fill #0

## 2022-05-13 ENCOUNTER — Ambulatory Visit: Admit: 2022-05-13 | Payer: MEDICAID

## 2022-05-15 DIAGNOSIS — G373 Acute transverse myelitis in demyelinating disease of central nervous system: Principal | ICD-10-CM

## 2022-05-16 ENCOUNTER — Institutional Professional Consult (permissible substitution): Admit: 2022-05-16 | Discharge: 2022-05-17 | Attending: Ambulatory Care | Primary: Ambulatory Care

## 2022-05-16 MED ORDER — NICOTINE 21 MG/24 HR DAILY TRANSDERMAL PATCH
MEDICATED_PATCH | TRANSDERMAL | 2 refills | 28 days | Status: CP
Start: 2022-05-16 — End: ?
  Filled 2022-05-21: qty 110, 5d supply, fill #0

## 2022-05-16 MED ORDER — NICOTINE (POLACRILEX) 4 MG GUM
ORAL | 2 refills | 5 days | Status: CP | PRN
Start: 2022-05-16 — End: ?

## 2022-05-16 MED ORDER — TRULICITY 3 MG/0.5 ML SUBCUTANEOUS PEN INJECTOR
SUBCUTANEOUS | 1 refills | 28 days | Status: CP
Start: 2022-05-16 — End: ?
  Filled 2022-05-21: qty 2, 28d supply, fill #0

## 2022-05-17 DIAGNOSIS — G373 Acute transverse myelitis in demyelinating disease of central nervous system: Principal | ICD-10-CM

## 2022-05-17 DIAGNOSIS — G8929 Other chronic pain: Principal | ICD-10-CM

## 2022-05-17 DIAGNOSIS — M48062 Spinal stenosis, lumbar region with neurogenic claudication: Principal | ICD-10-CM

## 2022-05-17 DIAGNOSIS — M545 Chronic bilateral low back pain without sciatica: Principal | ICD-10-CM

## 2022-05-17 MED ORDER — OXYCODONE 5 MG TABLET
ORAL_TABLET | Freq: Four times a day (QID) | ORAL | 0 refills | 4 days | Status: CP | PRN
Start: 2022-05-17 — End: 2022-05-31
  Filled 2022-05-18: qty 15, 4d supply, fill #0

## 2022-05-19 ENCOUNTER — Ambulatory Visit: Admit: 2022-05-19 | Discharge: 2022-05-20 | Payer: MEDICAID

## 2022-05-21 ENCOUNTER — Ambulatory Visit: Admit: 2022-05-21 | Discharge: 2022-05-22 | Payer: MEDICAID

## 2022-05-21 MED ORDER — OXYCODONE 5 MG TABLET
ORAL_TABLET | Freq: Four times a day (QID) | ORAL | 0 refills | 30.00000 days | Status: CP | PRN
Start: 2022-05-21 — End: 2022-05-21
  Filled 2022-05-22: qty 120, 30d supply, fill #0

## 2022-05-21 MED FILL — NICOTINE 21 MG/24 HR DAILY TRANSDERMAL PATCH: TRANSDERMAL | 28 days supply | Qty: 28 | Fill #0

## 2022-05-22 MED FILL — ALBUTEROL SULFATE HFA 90 MCG/ACTUATION AEROSOL INHALER: RESPIRATORY_TRACT | 17 days supply | Qty: 18 | Fill #1

## 2022-05-30 ENCOUNTER — Ambulatory Visit: Admit: 2022-05-30 | Discharge: 2022-05-31 | Payer: MEDICAID | Attending: Family Medicine | Primary: Family Medicine

## 2022-05-31 DIAGNOSIS — R195 Other fecal abnormalities: Principal | ICD-10-CM

## 2022-06-05 MED ORDER — DULOXETINE 30 MG CAPSULE,DELAYED RELEASE
ORAL_CAPSULE | Freq: Every day | ORAL | 2 refills | 30 days | Status: CP
Start: 2022-06-05 — End: 2023-06-05
  Filled 2022-06-07: qty 90, 90d supply, fill #0

## 2022-06-06 ENCOUNTER — Institutional Professional Consult (permissible substitution): Admit: 2022-06-06 | Discharge: 2022-06-07 | Attending: Surgery | Primary: Surgery

## 2022-06-06 DIAGNOSIS — R195 Other fecal abnormalities: Principal | ICD-10-CM

## 2022-06-07 ENCOUNTER — Institutional Professional Consult (permissible substitution): Admit: 2022-06-07 | Discharge: 2022-06-08 | Payer: MEDICAID | Attending: Family Medicine | Primary: Family Medicine

## 2022-06-07 MED ORDER — FLUTICASONE PROPIONATE 50 MCG/ACTUATION NASAL SPRAY,SUSPENSION
Freq: Every day | NASAL | 11 refills | 120 days | Status: CP
Start: 2022-06-07 — End: 2023-06-07
  Filled 2022-06-08: qty 16, 60d supply, fill #0

## 2022-06-07 MED ORDER — CETIRIZINE 10 MG TABLET
ORAL_TABLET | Freq: Every day | ORAL | 3 refills | 90 days | Status: CP
Start: 2022-06-07 — End: 2023-06-07
  Filled 2022-06-20: qty 90, 90d supply, fill #0

## 2022-06-07 MED FILL — VARENICLINE 1 MG TABLET: ORAL | 30 days supply | Qty: 60 | Fill #2

## 2022-06-07 MED FILL — PREGABALIN 75 MG CAPSULE: ORAL | 30 days supply | Qty: 90 | Fill #1

## 2022-06-07 MED FILL — ACETAMINOPHEN 500 MG TABLET: ORAL | 34 days supply | Qty: 200 | Fill #3

## 2022-06-07 MED FILL — MAGNESIUM OXIDE 400 MG (241.3 MG MAGNESIUM) TABLET: ORAL | 30 days supply | Qty: 30 | Fill #2

## 2022-06-08 MED FILL — DICLOFENAC 1 % TOPICAL GEL: TOPICAL | 13 days supply | Qty: 100 | Fill #0

## 2022-06-10 MED FILL — INSULIN GLARGINE (U-100) 100 UNIT/ML (3 ML) SUBCUTANEOUS PEN: SUBCUTANEOUS | 80 days supply | Qty: 45 | Fill #2

## 2022-06-10 MED FILL — ALBUTEROL SULFATE HFA 90 MCG/ACTUATION AEROSOL INHALER: RESPIRATORY_TRACT | 17 days supply | Qty: 18 | Fill #2

## 2022-06-19 ENCOUNTER — Ambulatory Visit: Admit: 2022-06-19 | Payer: MEDICAID | Attending: Ambulatory Care | Primary: Ambulatory Care

## 2022-06-20 ENCOUNTER — Ambulatory Visit: Admit: 2022-06-20 | Discharge: 2022-06-21

## 2022-06-20 ENCOUNTER — Ambulatory Visit: Admit: 2022-06-20 | Discharge: 2022-06-21 | Attending: Ambulatory Care | Primary: Ambulatory Care

## 2022-06-20 MED ORDER — CHLORHEXIDINE GLUCONATE 0.12 % MOUTHWASH
Freq: Two times a day (BID) | OROMUCOSAL | 0 refills | 79 days | Status: CP
Start: 2022-06-20 — End: 2022-09-18
  Filled 2022-06-26: qty 946, 30d supply, fill #0

## 2022-06-20 MED ORDER — OXYCODONE 5 MG TABLET
ORAL_TABLET | Freq: Four times a day (QID) | ORAL | 0 refills | 5.00000 days | Status: CP | PRN
Start: 2022-06-20 — End: 2022-05-21
  Filled 2022-06-20: qty 20, 5d supply, fill #0

## 2022-06-25 DIAGNOSIS — J449 Chronic obstructive pulmonary disease, unspecified: Principal | ICD-10-CM

## 2022-06-25 MED ORDER — ONDANSETRON HCL 4 MG TABLET
ORAL_TABLET | Freq: Every day | ORAL | 1 refills | 30 days | Status: CP | PRN
Start: 2022-06-25 — End: 2023-06-25
  Filled 2022-06-26: qty 30, 30d supply, fill #0

## 2022-06-25 MED ORDER — ALBUTEROL SULFATE HFA 90 MCG/ACTUATION AEROSOL INHALER
RESPIRATORY_TRACT | 2 refills | 17 days | Status: CP | PRN
Start: 2022-06-25 — End: ?
  Filled 2022-06-26: qty 18, 17d supply, fill #0

## 2022-06-26 ENCOUNTER — Ambulatory Visit: Admit: 2022-06-26 | Discharge: 2022-06-27 | Attending: Ambulatory Care | Primary: Ambulatory Care

## 2022-06-26 ENCOUNTER — Ambulatory Visit
Admit: 2022-06-26 | Discharge: 2022-06-27 | Attending: Student in an Organized Health Care Education/Training Program | Primary: Student in an Organized Health Care Education/Training Program

## 2022-06-26 DIAGNOSIS — E1165 Type 2 diabetes mellitus with hyperglycemia: Principal | ICD-10-CM

## 2022-06-26 MED ORDER — OXYCODONE 5 MG TABLET
ORAL_TABLET | Freq: Four times a day (QID) | ORAL | 0 refills | 30.00000 days | Status: CP | PRN
Start: 2022-06-26 — End: 2022-06-26
  Filled 2022-06-26: qty 120, 30d supply, fill #0

## 2022-06-26 MED FILL — NICOTINE (POLACRILEX) 4 MG GUM: ORAL | 30 days supply | Qty: 220 | Fill #1

## 2022-06-26 MED FILL — NICOTINE 21 MG/24 HR DAILY TRANSDERMAL PATCH: TRANSDERMAL | 56 days supply | Qty: 56 | Fill #1

## 2022-06-26 MED FILL — DICLOFENAC 1 % TOPICAL GEL: TOPICAL | 38 days supply | Qty: 300 | Fill #0

## 2022-06-26 MED FILL — TRULICITY 3 MG/0.5 ML SUBCUTANEOUS PEN INJECTOR: SUBCUTANEOUS | 28 days supply | Qty: 2 | Fill #1

## 2022-06-28 DIAGNOSIS — M25562 Pain in left knee: Principal | ICD-10-CM

## 2022-07-03 ENCOUNTER — Ambulatory Visit: Admit: 2022-07-03

## 2022-07-04 ENCOUNTER — Ambulatory Visit: Admit: 2022-07-04 | Discharge: 2022-07-05 | Payer: MEDICAID

## 2022-07-04 DIAGNOSIS — I10 Essential (primary) hypertension: Principal | ICD-10-CM

## 2022-07-04 DIAGNOSIS — M545 Chronic bilateral low back pain without sciatica: Principal | ICD-10-CM

## 2022-07-04 DIAGNOSIS — G8929 Other chronic pain: Principal | ICD-10-CM

## 2022-07-04 DIAGNOSIS — M2550 Pain in unspecified joint: Principal | ICD-10-CM

## 2022-07-04 MED ORDER — AMLODIPINE 10 MG TABLET
ORAL_TABLET | Freq: Every day | ORAL | 3 refills | 90 days | Status: CP
Start: 2022-07-04 — End: ?
  Filled 2022-07-05: qty 30, 30d supply, fill #0

## 2022-07-04 MED ORDER — PREGABALIN 75 MG CAPSULE
ORAL_CAPSULE | Freq: Three times a day (TID) | ORAL | 2 refills | 30 days | Status: CP
Start: 2022-07-04 — End: 2023-07-04
  Filled 2022-07-05: qty 90, 30d supply, fill #0

## 2022-07-04 MED ORDER — DULOXETINE 30 MG CAPSULE,DELAYED RELEASE
ORAL_CAPSULE | Freq: Every day | ORAL | 2 refills | 30 days | Status: CP
Start: 2022-07-04 — End: 2023-07-04
  Filled 2022-07-09: qty 30, 30d supply, fill #0

## 2022-07-04 MED ORDER — ACETAMINOPHEN 500 MG TABLET
ORAL_TABLET | Freq: Three times a day (TID) | ORAL | 11 refills | 34 days | Status: CP
Start: 2022-07-04 — End: 2023-07-04
  Filled 2022-07-05: qty 200, 34d supply, fill #0

## 2022-07-04 MED ORDER — LISINOPRIL 20 MG TABLET
ORAL_TABLET | Freq: Every day | ORAL | 3 refills | 90 days | Status: CP
Start: 2022-07-04 — End: ?
  Filled 2022-07-05: qty 30, 30d supply, fill #0

## 2022-07-04 MED ORDER — BUPRENORPHINE 2 MG-NALOXONE 0.5 MG SUBLINGUAL TABLET
ORAL_TABLET | Freq: Every day | SUBLINGUAL | 0 refills | 30 days | Status: CP
Start: 2022-07-04 — End: 2022-08-03
  Filled 2022-07-05: qty 30, 30d supply, fill #0

## 2022-07-04 MED ORDER — DICLOFENAC 1 % TOPICAL GEL
Freq: Four times a day (QID) | TOPICAL | 6 refills | 25 days | Status: CP
Start: 2022-07-04 — End: 2023-07-04

## 2022-07-05 ENCOUNTER — Ambulatory Visit: Admit: 2022-07-05

## 2022-07-09 ENCOUNTER — Ambulatory Visit: Admit: 2022-07-09 | Discharge: 2022-07-10 | Payer: MEDICAID

## 2022-07-09 MED ORDER — FLUCONAZOLE 150 MG TABLET
ORAL_TABLET | Freq: Once | ORAL | 0 refills | 1 days | Status: CP
Start: 2022-07-09 — End: 2022-07-10
  Filled 2022-07-09: qty 1, 1d supply, fill #0

## 2022-07-09 MED ORDER — MELOXICAM 7.5 MG TABLET
ORAL_TABLET | Freq: Every day | ORAL | 0 refills | 30 days | Status: CP
Start: 2022-07-09 — End: 2022-08-08

## 2022-07-10 ENCOUNTER — Ambulatory Visit: Admit: 2022-07-10 | Attending: Ambulatory Care | Primary: Ambulatory Care

## 2022-07-11 DIAGNOSIS — M549 Dorsalgia, unspecified: Principal | ICD-10-CM

## 2022-07-11 MED ORDER — MELOXICAM 7.5 MG TABLET
ORAL_TABLET | Freq: Every day | ORAL | 0 refills | 30 days | Status: CP
Start: 2022-07-11 — End: 2022-08-10
  Filled 2022-07-12: qty 30, 30d supply, fill #0

## 2022-07-12 DIAGNOSIS — G8929 Other chronic pain: Principal | ICD-10-CM

## 2022-07-12 MED ORDER — OXYCODONE 5 MG TABLET
ORAL_TABLET | Freq: Four times a day (QID) | ORAL | 0 refills | 2.00000 days | Status: CP | PRN
Start: 2022-07-12 — End: 2022-07-12

## 2022-07-12 MED ORDER — BUPRENORPHINE 5 MCG/HOUR WEEKLY TRANSDERMAL PATCH
MEDICATED_PATCH | TRANSDERMAL | 0 refills | 28 days | Status: CP
Start: 2022-07-12 — End: 2022-08-11
  Filled 2022-07-12: qty 4, 28d supply, fill #0

## 2022-07-12 MED ORDER — DICLOFENAC SODIUM 50 MG TABLET,DELAYED RELEASE
ORAL_TABLET | Freq: Two times a day (BID) | ORAL | 0 refills | 30.00000 days | Status: CP
Start: 2022-07-12 — End: 2022-07-12
  Filled 2022-08-05: qty 200, 25d supply, fill #0

## 2022-07-12 MED FILL — ALBUTEROL SULFATE HFA 90 MCG/ACTUATION AEROSOL INHALER: RESPIRATORY_TRACT | 17 days supply | Qty: 18 | Fill #1

## 2022-07-12 MED FILL — VARENICLINE 1 MG TABLET: ORAL | 30 days supply | Qty: 60 | Fill #3

## 2022-07-12 MED FILL — MAGNESIUM OXIDE 400 MG (241.3 MG MAGNESIUM) TABLET: ORAL | 30 days supply | Qty: 30 | Fill #3

## 2022-07-16 MED ORDER — TRULICITY 3 MG/0.5 ML SUBCUTANEOUS PEN INJECTOR
SUBCUTANEOUS | 1 refills | 28 days | Status: CP
Start: 2022-07-16 — End: ?
  Filled 2022-07-17: qty 2, 28d supply, fill #0

## 2022-07-18 ENCOUNTER — Emergency Department: Admit: 2022-07-18 | Discharge: 2022-07-18 | Disposition: A | Discharge status: Home / Self Care | Payer: MEDICAID

## 2022-07-18 ENCOUNTER — Ambulatory Visit: Admit: 2022-07-18 | Discharge: 2022-07-18 | Disposition: A | Discharge status: Home / Self Care | Payer: MEDICAID

## 2022-07-18 DIAGNOSIS — G8929 Other chronic pain: Principal | ICD-10-CM

## 2022-07-18 DIAGNOSIS — M25562 Pain in left knee: Principal | ICD-10-CM

## 2022-07-18 DIAGNOSIS — J441 Chronic obstructive pulmonary disease with (acute) exacerbation: Principal | ICD-10-CM

## 2022-07-18 MED ORDER — DOXYCYCLINE HYCLATE 100 MG CAPSULE
ORAL_CAPSULE | Freq: Two times a day (BID) | ORAL | 0 refills | 10 days | Status: CP
Start: 2022-07-18 — End: 2022-07-28
  Filled 2022-07-18: qty 20, 10d supply, fill #0

## 2022-07-18 MED ORDER — ALBUTEROL SULFATE HFA 90 MCG/ACTUATION AEROSOL INHALER
Freq: Four times a day (QID) | RESPIRATORY_TRACT | 0 refills | 0 days | Status: CP | PRN
Start: 2022-07-18 — End: 2023-07-18

## 2022-07-19 ENCOUNTER — Ambulatory Visit: Admit: 2022-07-19 | Discharge: 2022-07-20 | Payer: MEDICAID

## 2022-07-19 MED ORDER — OXYCODONE 5 MG TABLET
ORAL_TABLET | Freq: Four times a day (QID) | ORAL | 0 refills | 3 days | Status: CP | PRN
Start: 2022-07-19 — End: 2022-07-24
  Filled 2022-07-20: qty 10, 3d supply, fill #0

## 2022-07-22 ENCOUNTER — Ambulatory Visit: Admit: 2022-07-22 | Discharge: 2022-07-22 | Discharge status: Against Medical Advice | Payer: MEDICAID

## 2022-07-26 MED FILL — EZETIMIBE 10 MG TABLET: ORAL | 90 days supply | Qty: 90 | Fill #1

## 2022-07-26 MED FILL — CHLORHEXIDINE GLUCONATE 0.12 % MOUTHWASH: OROMUCOSAL | 30 days supply | Qty: 946 | Fill #0

## 2022-08-01 ENCOUNTER — Ambulatory Visit: Admit: 2022-08-01 | Discharge: 2022-08-02 | Payer: MEDICAID | Attending: Family Medicine | Primary: Family Medicine

## 2022-08-01 ENCOUNTER — Ambulatory Visit: Admit: 2022-08-01 | Discharge: 2022-08-02 | Payer: MEDICAID

## 2022-08-02 MED ORDER — NICOTINE (POLACRILEX) 4 MG GUM
ORAL | 2 refills | 5 days | Status: CP | PRN
Start: 2022-08-02 — End: ?

## 2022-08-02 MED ORDER — NICOTINE 21 MG/24 HR DAILY TRANSDERMAL PATCH
MEDICATED_PATCH | TRANSDERMAL | 2 refills | 28 days | Status: CP
Start: 2022-08-02 — End: ?
  Filled 2022-08-05: qty 110, 5d supply, fill #0

## 2022-08-05 ENCOUNTER — Ambulatory Visit: Admit: 2022-08-05 | Discharge: 2022-08-06

## 2022-08-05 DIAGNOSIS — M19042 Primary osteoarthritis, left hand: Principal | ICD-10-CM

## 2022-08-05 DIAGNOSIS — G8929 Other chronic pain: Principal | ICD-10-CM

## 2022-08-05 DIAGNOSIS — M2550 Pain in unspecified joint: Principal | ICD-10-CM

## 2022-08-05 DIAGNOSIS — M19041 Primary osteoarthritis, right hand: Principal | ICD-10-CM

## 2022-08-05 DIAGNOSIS — M545 Chronic bilateral low back pain without sciatica: Principal | ICD-10-CM

## 2022-08-05 DIAGNOSIS — M1712 Unilateral primary osteoarthritis, left knee: Principal | ICD-10-CM

## 2022-08-05 MED ORDER — DULOXETINE 60 MG CAPSULE,DELAYED RELEASE
ORAL_CAPSULE | Freq: Every day | ORAL | 2 refills | 30 days | Status: CP
Start: 2022-08-05 — End: 2023-08-05
  Filled 2022-08-05: qty 90, 90d supply, fill #0

## 2022-08-05 MED FILL — LISINOPRIL 20 MG TABLET: ORAL | 30 days supply | Qty: 30 | Fill #0

## 2022-08-05 MED FILL — FLUTICASONE PROPIONATE 50 MCG/ACTUATION NASAL SPRAY,SUSPENSION: NASAL | 60 days supply | Qty: 16 | Fill #0

## 2022-08-05 MED FILL — ALBUTEROL SULFATE HFA 90 MCG/ACTUATION AEROSOL INHALER: RESPIRATORY_TRACT | 17 days supply | Qty: 18 | Fill #2

## 2022-08-05 MED FILL — AMLODIPINE 10 MG TABLET: ORAL | 90 days supply | Qty: 90 | Fill #0

## 2022-08-05 MED FILL — VARENICLINE 1 MG TABLET: ORAL | 30 days supply | Qty: 60 | Fill #4

## 2022-08-05 MED FILL — MAGNESIUM OXIDE 400 MG (241.3 MG MAGNESIUM) TABLET: ORAL | 90 days supply | Qty: 90 | Fill #4

## 2022-08-05 MED FILL — ACETAMINOPHEN 500 MG TABLET: ORAL | 34 days supply | Qty: 200 | Fill #0

## 2022-08-09 ENCOUNTER — Ambulatory Visit: Admit: 2022-08-09 | Discharge: 2022-08-10 | Payer: MEDICAID

## 2022-08-09 MED ORDER — BUPRENORPHINE HCL 2 MG SUBLINGUAL TABLET
ORAL_TABLET | Freq: Two times a day (BID) | SUBLINGUAL | 0 refills | 7 days | Status: CP
Start: 2022-08-09 — End: ?

## 2022-08-09 MED ORDER — NALOXONE 4 MG/ACTUATION NASAL SPRAY
11 refills | 0 days | Status: CP
Start: 2022-08-09 — End: 2023-08-09
  Filled 2022-08-14: qty 2, 1d supply, fill #0

## 2022-08-12 NOTE — Unmapped (Signed)
Patient Advice Request     Patient Name: Tonya Wood  Caller: Self (Patient)  Contact Method: Telephone Call: Time- Any Time [phone]   Reason for Call: Patient is calling because she states that she is in severe pain, she states that the pain is messing with her vision. Also, patient states that the Subutex is not helping or touching the pain at all. She is requesting Oxycodone due to the severity of her pain.  Previously Discussed: yes  Appointment Offered: Patient has upcoming appt.

## 2022-08-13 MED FILL — NICOTINE 21 MG/24 HR DAILY TRANSDERMAL PATCH: TRANSDERMAL | 28 days supply | Qty: 28 | Fill #0

## 2022-08-14 MED FILL — TRULICITY 3 MG/0.5 ML SUBCUTANEOUS PEN INJECTOR: SUBCUTANEOUS | 28 days supply | Qty: 2 | Fill #1

## 2022-08-21 ENCOUNTER — Telehealth: Admit: 2022-08-21 | Discharge: 2022-08-22 | Payer: MEDICAID

## 2022-08-21 DIAGNOSIS — G8929 Other chronic pain: Principal | ICD-10-CM

## 2022-08-21 DIAGNOSIS — M1712 Unilateral primary osteoarthritis, left knee: Principal | ICD-10-CM

## 2022-08-21 DIAGNOSIS — M19041 Primary osteoarthritis, right hand: Principal | ICD-10-CM

## 2022-08-21 DIAGNOSIS — M545 Chronic bilateral low back pain without sciatica: Principal | ICD-10-CM

## 2022-08-21 DIAGNOSIS — M19042 Primary osteoarthritis, left hand: Principal | ICD-10-CM

## 2022-08-21 MED ORDER — BUPRENORPHINE HCL 2 MG SUBLINGUAL TABLET
ORAL_TABLET | Freq: Three times a day (TID) | SUBLINGUAL | 0 refills | 10 days | Status: CP
Start: 2022-08-21 — End: 2022-08-31

## 2022-08-22 DIAGNOSIS — M545 Chronic bilateral low back pain without sciatica: Principal | ICD-10-CM

## 2022-08-22 DIAGNOSIS — M19041 Primary osteoarthritis, right hand: Principal | ICD-10-CM

## 2022-08-22 DIAGNOSIS — M19042 Primary osteoarthritis, left hand: Principal | ICD-10-CM

## 2022-08-22 DIAGNOSIS — G8929 Other chronic pain: Principal | ICD-10-CM

## 2022-08-22 DIAGNOSIS — M1712 Unilateral primary osteoarthritis, left knee: Principal | ICD-10-CM

## 2022-08-22 MED FILL — PREGABALIN 75 MG CAPSULE: ORAL | 30 days supply | Qty: 90 | Fill #0

## 2022-08-22 MED FILL — NOVOLOG FLEXPEN U-100 INSULIN ASPART 100 UNIT/ML (3 ML) SUBCUTANEOUS: SUBCUTANEOUS | 75 days supply | Qty: 15 | Fill #1

## 2022-08-22 MED FILL — INSULIN GLARGINE (U-100) 100 UNIT/ML (3 ML) SUBCUTANEOUS PEN: SUBCUTANEOUS | 80 days supply | Qty: 45 | Fill #3

## 2022-08-30 MED FILL — ONDANSETRON HCL 4 MG TABLET: ORAL | 30 days supply | Qty: 30 | Fill #1

## 2022-08-30 MED FILL — NICOTINE (POLACRILEX) 4 MG GUM: ORAL | 5 days supply | Qty: 110 | Fill #1

## 2022-08-30 MED FILL — ACETAMINOPHEN 500 MG TABLET: ORAL | 34 days supply | Qty: 200 | Fill #1

## 2022-08-30 MED FILL — DICLOFENAC 1 % TOPICAL GEL: TOPICAL | 25 days supply | Qty: 200 | Fill #1

## 2022-08-30 MED FILL — NICOTINE 21 MG/24 HR DAILY TRANSDERMAL PATCH: TRANSDERMAL | 28 days supply | Qty: 28 | Fill #1

## 2022-08-30 MED FILL — LISINOPRIL 20 MG TABLET: ORAL | 30 days supply | Qty: 30 | Fill #1

## 2022-09-05 ENCOUNTER — Ambulatory Visit
Admit: 2022-09-05 | Discharge: 2022-09-06 | Payer: PRIVATE HEALTH INSURANCE | Attending: Student in an Organized Health Care Education/Training Program | Primary: Student in an Organized Health Care Education/Training Program

## 2022-09-05 ENCOUNTER — Ambulatory Visit: Admit: 2022-09-05 | Discharge: 2022-09-06 | Payer: PRIVATE HEALTH INSURANCE

## 2022-09-05 MED ORDER — COLCHICINE 0.6 MG TABLET
ORAL_TABLET | Freq: Every day | ORAL | 3 refills | 90 days | Status: CP
Start: 2022-09-05 — End: 2023-09-05
  Filled 2022-09-05: qty 34, 34d supply, fill #0

## 2022-09-05 MED FILL — CETIRIZINE 10 MG TABLET: ORAL | 30 days supply | Qty: 30 | Fill #1

## 2022-09-10 MED ORDER — TRULICITY 3 MG/0.5 ML SUBCUTANEOUS PEN INJECTOR
SUBCUTANEOUS | 1 refills | 28 days | Status: CP
Start: 2022-09-10 — End: ?
  Filled 2022-09-12: qty 2, 28d supply, fill #0

## 2022-09-11 DIAGNOSIS — E1165 Type 2 diabetes mellitus with hyperglycemia: Principal | ICD-10-CM

## 2022-09-12 DIAGNOSIS — G0491 Myelitis, unspecified: Principal | ICD-10-CM

## 2022-09-12 MED FILL — VARENICLINE 1 MG TABLET: ORAL | 30 days supply | Qty: 60 | Fill #5

## 2022-09-13 ENCOUNTER — Ambulatory Visit: Admit: 2022-09-13 | Payer: PRIVATE HEALTH INSURANCE

## 2022-09-18 ENCOUNTER — Ambulatory Visit: Admit: 2022-09-18 | Payer: PRIVATE HEALTH INSURANCE | Attending: Ambulatory Care | Primary: Ambulatory Care

## 2022-09-19 ENCOUNTER — Ambulatory Visit
Admit: 2022-09-19 | Discharge: 2022-09-20 | Payer: PRIVATE HEALTH INSURANCE | Attending: Family Medicine | Primary: Family Medicine

## 2022-09-19 MED ORDER — PREDNISONE 20 MG TABLET
ORAL_TABLET | Freq: Every day | ORAL | 0 refills | 5 days | Status: CP
Start: 2022-09-19 — End: 2022-09-24
  Filled 2022-09-19: qty 10, 5d supply, fill #0

## 2022-09-19 MED FILL — ACETAMINOPHEN 500 MG TABLET: ORAL | 34 days supply | Qty: 200 | Fill #0

## 2022-09-20 ENCOUNTER — Ambulatory Visit: Admit: 2022-09-20 | Discharge: 2022-09-21 | Payer: PRIVATE HEALTH INSURANCE

## 2022-09-20 DIAGNOSIS — M545 Chronic bilateral low back pain without sciatica: Principal | ICD-10-CM

## 2022-09-20 DIAGNOSIS — G8929 Other chronic pain: Principal | ICD-10-CM

## 2022-09-25 ENCOUNTER — Institutional Professional Consult (permissible substitution)
Admit: 2022-09-25 | Discharge: 2022-09-26 | Payer: PRIVATE HEALTH INSURANCE | Attending: Ambulatory Care | Primary: Ambulatory Care

## 2022-09-25 MED ORDER — INSULIN ASPART (U-100) 100 UNIT/ML (3 ML) SUBCUTANEOUS PEN
Freq: Two times a day (BID) | SUBCUTANEOUS | 5 refills | 54 days | Status: CP
Start: 2022-09-25 — End: 2023-09-25
  Filled 2022-10-01: qty 15, 54d supply, fill #0

## 2022-09-25 MED ORDER — IPRATROPIUM 0.5 MG-ALBUTEROL 3 MG (2.5 MG BASE)/3 ML NEBULIZATION SOLN
Freq: Four times a day (QID) | RESPIRATORY_TRACT | 3 refills | 15.00000 days | Status: CP | PRN
Start: 2022-09-25 — End: 2023-09-25
  Filled 2022-09-25: qty 180, 15d supply, fill #0

## 2022-09-25 MED ORDER — ALBUTEROL SULFATE HFA 90 MCG/ACTUATION AEROSOL INHALER
Freq: Four times a day (QID) | RESPIRATORY_TRACT | 11 refills | 0.00000 days | Status: CP | PRN
Start: 2022-09-25 — End: 2022-09-25

## 2022-09-25 MED ORDER — TRULICITY 4.5 MG/0.5 ML SUBCUTANEOUS PEN INJECTOR
SUBCUTANEOUS | 3 refills | 0 days | Status: CP
Start: 2022-09-25 — End: 2022-09-25
  Filled 2022-09-25: qty 6, 28d supply, fill #0

## 2022-09-26 ENCOUNTER — Ambulatory Visit: Admit: 2022-09-26 | Payer: PRIVATE HEALTH INSURANCE | Attending: Family Medicine | Primary: Family Medicine

## 2022-10-01 MED FILL — DICLOFENAC 1 % TOPICAL GEL: TOPICAL | 25 days supply | Qty: 200 | Fill #0

## 2022-10-01 MED FILL — NICOTINE 21 MG/24 HR DAILY TRANSDERMAL PATCH: TRANSDERMAL | 28 days supply | Qty: 28 | Fill #2

## 2022-10-01 MED FILL — FLUTICASONE PROPIONATE 50 MCG/ACTUATION NASAL SPRAY,SUSPENSION: NASAL | 60 days supply | Qty: 16 | Fill #1

## 2022-10-01 MED FILL — NICOTINE (POLACRILEX) 4 MG GUM: ORAL | 5 days supply | Qty: 110 | Fill #2

## 2022-10-01 MED FILL — LISINOPRIL 20 MG TABLET: ORAL | 90 days supply | Qty: 90 | Fill #0

## 2022-10-03 ENCOUNTER — Ambulatory Visit: Admit: 2022-10-03 | Payer: PRIVATE HEALTH INSURANCE | Attending: Family Medicine | Primary: Family Medicine

## 2022-10-09 MED FILL — CETIRIZINE 10 MG TABLET: ORAL | 30 days supply | Qty: 30 | Fill #0

## 2022-10-09 MED FILL — COLCHICINE 0.6 MG TABLET: ORAL | 30 days supply | Qty: 30 | Fill #0

## 2022-10-09 MED FILL — VENTOLIN HFA 90 MCG/ACTUATION AEROSOL INHALER: RESPIRATORY_TRACT | 17 days supply | Qty: 18 | Fill #0

## 2022-10-16 ENCOUNTER — Ambulatory Visit: Admit: 2022-10-16 | Discharge: 2022-10-17 | Payer: PRIVATE HEALTH INSURANCE | Attending: Neurology | Primary: Neurology

## 2022-10-16 DIAGNOSIS — G379 Demyelinating disease of central nervous system, unspecified: Principal | ICD-10-CM

## 2022-10-16 DIAGNOSIS — G959 Disease of spinal cord, unspecified: Principal | ICD-10-CM

## 2022-10-16 DIAGNOSIS — H538 Other visual disturbances: Principal | ICD-10-CM

## 2022-10-16 DIAGNOSIS — G0491 Myelitis, unspecified: Principal | ICD-10-CM

## 2022-10-17 MED FILL — ACETAMINOPHEN 500 MG TABLET: ORAL | 34 days supply | Qty: 200 | Fill #0

## 2022-10-17 MED FILL — TRULICITY 4.5 MG/0.5 ML SUBCUTANEOUS PEN INJECTOR: SUBCUTANEOUS | 28 days supply | Qty: 2 | Fill #0

## 2022-10-17 MED FILL — EZETIMIBE 10 MG TABLET: ORAL | 90 days supply | Qty: 90 | Fill #2

## 2022-10-21 MED FILL — VENTOLIN HFA 90 MCG/ACTUATION AEROSOL INHALER: RESPIRATORY_TRACT | 25 days supply | Qty: 18 | Fill #1

## 2022-10-24 DIAGNOSIS — M19042 Primary osteoarthritis, left hand: Principal | ICD-10-CM

## 2022-10-24 DIAGNOSIS — M19041 Primary osteoarthritis, right hand: Principal | ICD-10-CM

## 2022-10-24 DIAGNOSIS — M545 Chronic bilateral low back pain without sciatica: Principal | ICD-10-CM

## 2022-10-24 DIAGNOSIS — G8929 Other chronic pain: Principal | ICD-10-CM

## 2022-10-24 DIAGNOSIS — M2550 Pain in unspecified joint: Principal | ICD-10-CM

## 2022-10-24 MED ORDER — DULOXETINE 60 MG CAPSULE,DELAYED RELEASE
ORAL_CAPSULE | Freq: Every day | ORAL | 2 refills | 30 days | Status: CP
Start: 2022-10-24 — End: 2023-01-22
  Filled 2022-10-29: qty 90, 90d supply, fill #0

## 2022-10-28 ENCOUNTER — Ambulatory Visit
Admit: 2022-10-28 | Discharge: 2022-10-28 | Payer: PRIVATE HEALTH INSURANCE | Attending: Student in an Organized Health Care Education/Training Program | Primary: Student in an Organized Health Care Education/Training Program

## 2022-10-28 ENCOUNTER — Ambulatory Visit: Admit: 2022-10-28 | Discharge: 2022-10-28 | Payer: PRIVATE HEALTH INSURANCE

## 2022-10-28 MED ORDER — IPRATROPIUM 0.5 MG-ALBUTEROL 3 MG (2.5 MG BASE)/3 ML NEBULIZATION SOLN
Freq: Four times a day (QID) | RESPIRATORY_TRACT | 3 refills | 15 days | Status: CP | PRN
Start: 2022-10-28 — End: 2023-10-28
  Filled 2022-10-29: qty 180, 15d supply, fill #0

## 2022-10-28 MED ORDER — NAPROXEN 500 MG TABLET
ORAL_TABLET | Freq: Two times a day (BID) | ORAL | 2 refills | 30 days | Status: CP
Start: 2022-10-28 — End: 2023-10-28
  Filled 2022-10-29: qty 60, 30d supply, fill #0

## 2022-10-28 MED ORDER — PREDNISONE 20 MG TABLET
ORAL_TABLET | Freq: Every day | ORAL | 0 refills | 5 days | Status: CP
Start: 2022-10-28 — End: 2022-11-02
  Filled 2022-10-29: qty 10, 5d supply, fill #0

## 2022-10-28 MED ORDER — LIDOCAINE 5 % TOPICAL PATCH
MEDICATED_PATCH | Freq: Two times a day (BID) | TRANSDERMAL | 11 refills | 10 days | Status: CP
Start: 2022-10-28 — End: 2023-02-25

## 2022-10-28 MED ORDER — OXYCODONE 5 MG TABLET
ORAL_TABLET | Freq: Three times a day (TID) | ORAL | 0 refills | 4 days | Status: CP | PRN
Start: 2022-10-28 — End: ?
  Filled 2022-10-28: qty 10, 4d supply, fill #0

## 2022-10-29 MED FILL — MAGNESIUM OXIDE 400 MG (241.3 MG MAGNESIUM) TABLET: ORAL | 90 days supply | Qty: 90 | Fill #5

## 2022-10-29 MED FILL — DICLOFENAC 1 % TOPICAL GEL: TOPICAL | 25 days supply | Qty: 200 | Fill #1

## 2022-10-29 MED FILL — AMLODIPINE 10 MG TABLET: ORAL | 90 days supply | Qty: 90 | Fill #1

## 2022-10-31 ENCOUNTER — Ambulatory Visit: Admit: 2022-10-31 | Discharge: 2022-11-01 | Payer: PRIVATE HEALTH INSURANCE

## 2022-11-01 MED ORDER — ACETAMINOPHEN 500 MG TABLET
ORAL_TABLET | Freq: Three times a day (TID) | ORAL | 11 refills | 34 days | Status: CP
Start: 2022-11-01 — End: 2023-11-01
  Filled 2022-11-12: qty 200, 34d supply, fill #0

## 2022-11-01 MED ORDER — LIDOCAINE-PRILOCAINE 2.5 %-2.5 % TOPICAL CREAM
Freq: Three times a day (TID) | TOPICAL | 0 refills | 0 days | Status: CP | PRN
Start: 2022-11-01 — End: 2023-11-01
  Filled 2022-11-01: qty 30, 15d supply, fill #0

## 2022-11-01 MED ORDER — PREGABALIN 75 MG CAPSULE
ORAL_CAPSULE | Freq: Three times a day (TID) | ORAL | 2 refills | 30 days | Status: CP
Start: 2022-11-01 — End: 2023-11-01
  Filled 2022-11-01: qty 90, 30d supply, fill #0

## 2022-11-01 MED ORDER — OXYCODONE 5 MG TABLET
ORAL_TABLET | Freq: Two times a day (BID) | ORAL | 0 refills | 15 days | Status: CP
Start: 2022-11-01 — End: ?
  Filled 2022-11-01: qty 30, 15d supply, fill #0

## 2022-11-01 MED ORDER — PEG 3350-ELECTROLYTES 236 GRAM-22.74 GRAM-6.74 GRAM-5.86 GRAM SOLUTION
Freq: Once | ORAL | 0 refills | 1 days | Status: CP
Start: 2022-11-01 — End: 2022-11-01

## 2022-11-01 MED ORDER — CYCLOBENZAPRINE 5 MG TABLET
ORAL_TABLET | Freq: Three times a day (TID) | ORAL | 0 refills | 30 days | Status: CP
Start: 2022-11-01 — End: 2022-12-01
  Filled 2022-11-01: qty 90, 30d supply, fill #0

## 2022-11-02 ENCOUNTER — Institutional Professional Consult (permissible substitution)
Admit: 2022-11-02 | Discharge: 2022-11-02 | Payer: PRIVATE HEALTH INSURANCE | Attending: Student in an Organized Health Care Education/Training Program | Primary: Student in an Organized Health Care Education/Training Program

## 2022-11-04 MED FILL — PEG 3350-ELECTROLYTES 236 GRAM-22.74 GRAM-6.74 GRAM-5.86 GRAM SOLUTION: ORAL | 1 days supply | Qty: 4000 | Fill #0

## 2022-11-07 ENCOUNTER — Ambulatory Visit
Admit: 2022-11-07 | Discharge: 2022-11-08 | Payer: PRIVATE HEALTH INSURANCE | Attending: Family Medicine | Primary: Family Medicine

## 2022-11-07 ENCOUNTER — Ambulatory Visit: Admit: 2022-11-07 | Discharge: 2022-11-08 | Payer: PRIVATE HEALTH INSURANCE

## 2022-11-07 MED ORDER — POLYETHYLENE GLYCOL 3350 17 GRAM/DOSE ORAL POWDER
0 refills | 0 days | Status: CP
Start: 2022-11-07 — End: ?

## 2022-11-07 MED ORDER — PEG 3350-ELECTROLYTES 236 GRAM-22.74 GRAM-6.74 GRAM-5.86 GRAM SOLUTION
Freq: Once | ORAL | 0 refills | 1 days | Status: CP
Start: 2022-11-07 — End: 2022-11-08

## 2022-11-07 MED ORDER — BISACODYL 5 MG TABLET,DELAYED RELEASE
ORAL_TABLET | ORAL | 0 refills | 0 days | Status: CP
Start: 2022-11-07 — End: ?
  Filled 2022-11-22: qty 238, 30d supply, fill #0

## 2022-11-11 ENCOUNTER — Institutional Professional Consult (permissible substitution)
Admit: 2022-11-11 | Discharge: 2022-11-12 | Payer: PRIVATE HEALTH INSURANCE | Attending: Student in an Organized Health Care Education/Training Program | Primary: Student in an Organized Health Care Education/Training Program

## 2022-11-11 MED ORDER — OXYCODONE 5 MG TABLET
ORAL_TABLET | Freq: Two times a day (BID) | ORAL | 0 refills | 8.00000 days | Status: CP
Start: 2022-11-11 — End: 2022-11-11
  Filled 2022-11-11: qty 15, 5d supply, fill #0

## 2022-11-12 MED ORDER — NICOTINE (POLACRILEX) 4 MG GUM
ORAL | 2 refills | 5 days | Status: CP | PRN
Start: 2022-11-12 — End: ?
  Filled 2022-11-22: qty 110, 5d supply, fill #0

## 2022-11-12 MED ORDER — NICOTINE 21 MG/24 HR DAILY TRANSDERMAL PATCH
MEDICATED_PATCH | TRANSDERMAL | 2 refills | 28 days | Status: CP
Start: 2022-11-12 — End: ?

## 2022-11-12 MED ORDER — INSULIN GLARGINE (U-100) 100 UNIT/ML (3 ML) SUBCUTANEOUS PEN
Freq: Every evening | SUBCUTANEOUS | 11 refills | 26 days | Status: CP
Start: 2022-11-12 — End: 2023-11-12

## 2022-11-15 ENCOUNTER — Ambulatory Visit
Admit: 2022-11-15 | Discharge: 2022-11-16 | Payer: PRIVATE HEALTH INSURANCE | Attending: Family Medicine | Primary: Family Medicine

## 2022-11-15 ENCOUNTER — Ambulatory Visit: Admit: 2022-11-15 | Discharge: 2022-11-16 | Payer: PRIVATE HEALTH INSURANCE

## 2022-11-16 ENCOUNTER — Ambulatory Visit
Admit: 2022-11-16 | Discharge: 2022-11-17 | Payer: PRIVATE HEALTH INSURANCE | Attending: Student in an Organized Health Care Education/Training Program | Primary: Student in an Organized Health Care Education/Training Program

## 2022-11-16 DIAGNOSIS — M25562 Pain in left knee: Principal | ICD-10-CM

## 2022-11-16 DIAGNOSIS — R531 Weakness: Principal | ICD-10-CM

## 2022-11-16 DIAGNOSIS — G8929 Other chronic pain: Principal | ICD-10-CM

## 2022-11-16 DIAGNOSIS — R52 Pain, unspecified: Principal | ICD-10-CM

## 2022-11-16 MED ORDER — OXYCODONE 5 MG TABLET
ORAL_TABLET | Freq: Two times a day (BID) | ORAL | 0 refills | 26.00000 days | Status: CP | PRN
Start: 2022-11-16 — End: 2022-11-16
  Filled 2022-11-16: qty 10, 5d supply, fill #0

## 2022-11-18 MED ORDER — PEG 3350-ELECTROLYTES 236 GRAM-22.74 GRAM-6.74 GRAM-5.86 GRAM SOLUTION
Freq: Once | ORAL | 0 refills | 1 days | Status: CP
Start: 2022-11-18 — End: 2022-11-19

## 2022-11-20 ENCOUNTER — Ambulatory Visit: Admit: 2022-11-20 | Discharge: 2022-11-20 | Payer: PRIVATE HEALTH INSURANCE

## 2022-11-20 ENCOUNTER — Institutional Professional Consult (permissible substitution)
Admit: 2022-11-20 | Discharge: 2022-11-20 | Payer: PRIVATE HEALTH INSURANCE | Attending: Ambulatory Care | Primary: Ambulatory Care

## 2022-11-20 MED ORDER — OXYCODONE 5 MG TABLET
ORAL_TABLET | Freq: Two times a day (BID) | ORAL | 0 refills | 21 days | Status: CP | PRN
Start: 2022-11-20 — End: ?
  Filled 2022-11-20: qty 10, 5d supply, fill #0

## 2022-11-20 MED ORDER — FREESTYLE LIBRE 3 READER
Freq: Once | 0 refills | 0 days | Status: CP
Start: 2022-11-20 — End: 2022-11-20

## 2022-11-20 MED ORDER — FREESTYLE LIBRE 3 SENSOR DEVICE
3 refills | 0 days | Status: CP
Start: 2022-11-20 — End: ?

## 2022-11-20 MED ORDER — INSULIN ASPART (U-100) 100 UNIT/ML (3 ML) SUBCUTANEOUS PEN
Freq: Two times a day (BID) | SUBCUTANEOUS | 4 refills | 75 days | Status: CP
Start: 2022-11-20 — End: 2023-11-20
  Filled 2022-11-22: qty 15, 26d supply, fill #0

## 2022-11-20 NOTE — Unmapped (Addendum)
Subjective     Reason for visit:    Tonya Wood is a 57 y.o. female with a history of diabetes (type 2), COPD, HTN, schizoaffective disorder, and tobacco use who presents today for a diabetes pharmacotherapy visit.  Patient presents to this visit alone.    Known DM Complications: no known complications    Date of Last Diabetes Related Visit: 09/25/22 with CPP, 10/28/22 with PCP    Action At Last Diabetes Related Visit:    Increase Trulicity from 3 to 4.5 mg SubQ weekly (09/25/22)  Continue Lantus 56 units daily  Decrease Novolog from 20 to 14 units BID AC - continue to emphasize importance of giving 10-15 minutes before eating  Continue Anoro Ellipta 1 puff daily  Continue Chantix 1 mg BID  Continue nicotine 21 mg/24h patch + 4 mg gum PRN  Continue ezetimibe 10 mg daily (statin previously stopped due to c/f contributing to CK elevation)  Encouraged pt to bring medications to future visits    Since Last visit / History of Present Illness:    Patient reports fully implementing plan from last visit. Confirms taking increased Trulicity without issues and decreased Novolog as instructed. Feels BG readings have been consistently well controlled so no concerns or complaints related to DM. Denies s/sx hyper- or hypoglycemia.    Mentions nebulizer broke/stopped working but has had for a long time so thinks she just needs a new one, otherwise using inhalers consistently. Increased frequency of SOB episodes due to spring allergy season, pollen and many other triggers [place]. Prior to nebulizer breaking, was using 1-3 times per day. Prior to allergy season, felt breathing was relatively well controlled.    Still smoking ~1-2 cigarettes per day. Not yet ready to quit. Using nicotine patch and Chantix regularly.    Reported DM Regimen:  Novolog 14 units BID AC (blue pen) - typically 2 times per day, still eating ~15-20 minutes after giving insulin  Lantus 56 units daily (gray pen)  Trulicity 4.5 mg weekly (Sundays)    DM medications tried in the past:   Metformin - pt doesn't like it so is not willing to take    Medication Adherence and Access:  Since last visit, patient denies missing doses of medications recently, although has had intermittent non-adherence in the recent past with periods of severe pain.    Pt requests new prescription for Depakote ER 1000 mg daily, which was previously prescribed by psychiatrist at [facility] but no longer follows with.    Pt previously had [place] PAP but now has active Medicaid.    SMBG Per patient memory:  Pt feels BG is decreased from last visit. Checking ~1 time per week at varying times of the day, sometimes AM and sometimes PM.    For past ~2 weeks:  Avg: 110  Highest: 110  Lowest: low 100s    Hypoglycemia:    Symptoms of hypoglycemia since last visit: no  If yes, it was treated by: n/a    DM-Related Prevention:  Statin: Not taking (stopped statin due to concern for causing elevated CK);  previously on atorvastatin 40 mg (high intensity) , now on ezetimibe 10 mg daily  Aspirin: unclear if indicated (pt reports history of CVA that occurred prior to seeking care at [facility] so not in recent health records); Taking    ACEI/ARB: Taking (lisinopril 20 mg); Urine MA/CR Ratio - normal (last checked 12/28/21).  Last eye exam: 04/24/20 -  DUE  Last foot exam: 12/28/21   Tobacco Use: Current smoker  Immunizations:   Immunization History   Administered Date(s) Administered    COVID-19 VAC,BIVALENT(71YR UP),PFIZER 08/08/2021    COVID-19 VAC,MRNA,TRIS(12Y UP)(PFIZER)(GRAY CAP) 01/28/2020, 02/18/2020    COVID-19 VACC,MRNA,(PFIZER)(PF) 01/28/2020, 02/18/2020, 08/21/2020    Covid-19 Vac, (20yr+) (Spikevax) Monovalent Xbb.1.5 Moder  08/01/2022    Covid-19 Vacc, Unspecified 01/28/2020, 02/18/2020, 08/21/2020    Hep A / Hep B 09/27/2003, 10/28/2003, 10/17/2006    INFLUENZA TIV (TRI) 28MO+ W/ PRESERV (IM) 02/25/2008, 07/11/2010    INFLUENZA TIV (TRI) PF (IM) 07/01/2013 Influenza Vaccine Quad(IM)6 MO-Adult(PF) 06/09/2015, 01/08/2017, 07/22/2018, 06/30/2020, 07/17/2021, 05/21/2022    Influenza Virus Vaccine, unspecified formulation 06/30/2020    PPD Test 07/06/2014, 07/15/2014, 05/22/2016    Pneumococcal Conjugate 20-valent 12/28/2021    TdaP 12/19/2011, 05/05/2019     _________________________________________________    Past Medical History: reviewed PMH in epic today    Social History:  Social History     Tobacco Use   Smoking Status Every Day    Current packs/day: 2.00    Average packs/day: 2.0 packs/day for 37.1 years (74.2 ttl pk-yrs)    Types: Cigarettes    Start date: 10/17/1985   Smokeless Tobacco Never       Medications: Medications reviewed in EPIC medication station and updated today by the clinical pharmacist practitioner.     Objective   Review of Systems:  Constitutional:  No fever, chills or unintentional weight loss  Cardiovascular:  No chest pain or pressure, shortness of breath, orthopnea or LE edema  Pulmonary:  No cough or SOB, (+) dyspnea on exertion  GI:  No constipation, diarrhea, dyspepsia, change in bowel habits, nausea, abdominal pain  Endocrine: No polyuria, polyphagia, blurred vision    Physical Examination:  Vitals:    Vitals:    11/20/22 0914   BP: 116/78   Pulse: 93         Wt Readings from Last 3 Encounters:   11/16/22 94.8 kg (209 lb)   11/15/22 94.3 kg (208 lb)   11/07/22 94.3 kg (208 lb)       There is no height or weight on file to calculate BMI.    The 10-year ASCVD risk score (Arnett DK, et al., 2019) is: 16.6%    Values used to calculate the score:      Age: 15 years      Sex: Female      Is Non-Hispanic African American: Yes      Diabetic: Yes      Tobacco smoker: Yes      Systolic Blood Pressure: 115 mmHg      Is BP treated: Yes      HDL Cholesterol: 41 mg/dL      Total Cholesterol: 152 mg/dL    Note: For patients with SBP <90 or >200, Total Cholesterol <130 or >320, HDL <20 or >100 which are outside of the allowable range, the calculator will use these upper or lower values to calculate the patient???s risk score.    mMRC:  Most recent MMRC dyspnea scale: 3 Last MMRC date: 11/20/2022    Labs:   Lab Results   Component Value Date    A1C 6.3 (H) 09/25/2022    A1C 7.1 (H) 06/26/2022     Lab Results   Component Value Date    CHOL 152 09/25/2022    TRIG 205 (H) 09/25/2022    HDL 41 09/25/2022    LDL 70 09/25/2022  Assessment/Plan:    1. Diabetes, type 2: controlled per last A1c of 6.3% (09/25/22), which is improved from 7.1% in Oct 2023 and further improved from 11.4% in June 2023 since initiation and titration of Trulicity. Goal <7% per ADA guidelines. Per reported SMBG, both FBG and PPBG values often at goal, although suspect we are not capturing much of her day-to-day BG trends since she is only checking 1 time per week. Based on notable improvement in A1c over the course of ~6 months with Trulicity initiation/titration and now on max dose and the few PPBG values pt can report are well below goal <180, feel it is reasonable to further decrease prandial insulin today to avoid hypoglycemia in the event that pt may have hypoglycemia unawareness and particularly since she is still inappropriately giving Novolog after meals. Also discussed pursuing CGM to help better assess day-to-day glucose trends and guide further tapering of insulin, which pt was open to and did not have specific preference for Dexcom vs Josephine Igo but ultimately decided to go with [employer].  Continue Trulicity 4.5 mg SubQ once weekly  Continue Lantus 56 units daily  Decrease Novolog from 14 to 10 units BID AC - continue to emphasize importance of giving 10-15 minutes before eating  Repeat A1c due 12/25/22  Reviewed symptoms and treatment of hypoglycemia, including need to check BG prior to and following treatment.  SMBG instructions: BID-TID  Sent Rx for Freestyle Libre 3 CGM (reader + sensors) - anticipate it will require a PA with Medicaid, scheduled visit in ~2 weeks to review CGM application and use and anticipate CGM will be approved by then  Future considerations:  Taper prandial insulin as indicated  SGLT2 inhibitor - may help to taper insulin further in future    *The patient requires a therapeutic CGM.   *The patient has diabetes mellitus and is insulin-treated with 3 or more daily injections of insulin(MDII) or a continuous subcutaneous insulin infusion (CSII) pump.   *The patient???s insulin treatment regimen requires frequent adjustments by the patient on the basis of therapeutic CGM testing results.   *Within the last six months prior to ordering the CGM, I had an in-person visit with the beneficiary to evaluate their diabetes control and determine that the above criteria are met.   *Every six months following the initial prescription of the CGM, I will have an in-person visit with the beneficiary to assess adherence to their CGM regimen and diabetes treatment plan.       2. 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 However, mMRC assessment shows slight improvement in dyspnea from score of 4 in 04/2022 to 3 today. Agreed to work towards getting pt new nebulizer for rescue therapy given increased need during this season. Continues on recommended maintenance therapy (LABA/LAMA) per the 2024 GOLD guidelines. Will continue to reassess inhaler technique and pt ability to utilize DPI inhaler appropriately with future visits as this is the main intervention for improving dyspnea, although could consider escalation to triple therapy if recurrent exacerbations in the future. Also continuing to work on smoking cessation as below.  Continue Anoro Ellipta 62.5-25mcg 1 puff Daily  Continue albuterol HFA inhaler (w/ spacer) q4-6h PRN  Continue ipratropium-albuterol nebulized solution q4-6h PRN  Sent order for new nebulizer device to [employer] D[employer] supplier    3. Smoking cessation: pt has reduced # of cigarettes per day per her report but continuing to work towards complete  cessation, using NRT patch + gum which are helping as well as Chantix. Will hold off on stepping down nicotine patch dose since pt still smoking occasionally, but will reconsider once she is ready to quit smoking cigarettes altogether.  Continue Chantix 1 mg BID  Continue nicotine 21 mg/24h patch daily + 4 mg gum PRN  Consider stepping down patch to 14 mg/24h dose as able in the future    4. ASCVD Prevention: moderate to high risk based on 10-year ASCVD risk score. No longer on high intensity statin due to some concern it was contributing to elevated CK levels, but on ezetimibe since June 2023. Last lipid panel with LDL 70 (09/25/22) and improved from 123 in Oct 2023. Goal LDL <70 per 2022 ACC ECDP given DM + ASCVD risk >7.5%. Since LDL essentially at goal, will not pursue any additional non-statin LDL lowering therapies at this time.  Continue ezetimibe 10 mg daily    5. Hypertension: controlled based on clinic BP of 116/78. Goal <130/80 per ADA guidelines. No further med changes warranted at this time.  Continue amlodipine 10 mg daily  Continue lisinopril 20 mg daily  Future considerations:  Titrate lisinopril to max dose of 40 mg as indicated  May consider addition of thiazide diuretic if additional BP lowering needed on max doses of CCB + ACEi    6. Medication management and care coordination:  Will message PCP about need for new Rx for Depakote ER 1000 mg daily (takes 2x500 mg tablets) and clarify if planning to refer pt to [facility] psychiatrist.    Follow-up: Return to [facility] in 2 weeks with CPP (for CGM review) and end of March with PCP as scheduled    Future Appointments   Date Time Provider Department Center   11/20/2022  1:20 PM [facility] MAMMO RM 2 HBR[facility]MO [place] - [facility]   11/27/2022  8:30 AM Suzzette Righter, MD PhD [facility]   11/28/2022  9:30 AM Caffie Pinto, MD ANESPAINMRKT [facility]   12/03/2022  1:30 PM HBR CT RM 1 H[facility] [place] - [facility]   12/04/2022 11:00 AM Melba Coon, CPP [facility]   12/05/2022 10:30 AM Delcie Roch, DMD UNCDFPOMLSU[facility]   12/10/2022  8:00 AM Janene Madeira, MD [facility]   12/16/2022  1:00 PM Zelasky, Trixie Deis, PA [facility]       I spent a total of 35 minutes face to face with the patient delivering clinical care and providing education/counseling.    _________________________________________________    [name], PharmD, CPP, [facility]  Family Medicine Clinical Pharmacist

## 2022-11-22 ENCOUNTER — Ambulatory Visit: Admit: 2022-11-22 | Discharge: 2022-11-23 | Payer: PRIVATE HEALTH INSURANCE

## 2022-11-22 MED ORDER — OXYCODONE 5 MG TABLET
ORAL_TABLET | Freq: Two times a day (BID) | ORAL | 0 refills | 7 days | Status: CP | PRN
Start: 2022-11-22 — End: 2022-11-16

## 2022-11-22 MED FILL — NICOTINE 21 MG/24 HR DAILY TRANSDERMAL PATCH: TRANSDERMAL | 28 days supply | Qty: 28 | Fill #0

## 2022-11-22 MED FILL — PEG 3350-ELECTROLYTES 236 GRAM-22.74 GRAM-6.74 GRAM-5.86 GRAM SOLUTION: ORAL | 1 days supply | Qty: 4000 | Fill #0

## 2022-11-22 MED FILL — BISACODYL 5 MG TABLET,DELAYED RELEASE: ORAL | 1 days supply | Qty: 2 | Fill #0

## 2022-11-22 MED FILL — DICLOFENAC 1 % TOPICAL GEL: TOPICAL | 25 days supply | Qty: 200 | Fill #2

## 2022-11-22 MED FILL — TRULICITY 4.5 MG/0.5 ML SUBCUTANEOUS PEN INJECTOR: SUBCUTANEOUS | 28 days supply | Qty: 2 | Fill #1

## 2022-11-22 MED FILL — NALOXONE 4 MG/ACTUATION NASAL SPRAY: 1 days supply | Qty: 2 | Fill #1

## 2022-11-22 MED FILL — COLCHICINE 0.6 MG TABLET: ORAL | 30 days supply | Qty: 30 | Fill #1

## 2022-11-22 MED FILL — CETIRIZINE 10 MG TABLET: ORAL | 30 days supply | Qty: 30 | Fill #1

## 2022-11-22 MED FILL — IPRATROPIUM 0.5 MG-ALBUTEROL 3 MG (2.5 MG BASE)/3 ML NEBULIZATION SOLN: RESPIRATORY_TRACT | 15 days supply | Qty: 180 | Fill #0

## 2022-11-25 MED FILL — OXYCODONE 5 MG TABLET: ORAL | 5 days supply | Qty: 10 | Fill #1

## 2022-11-27 ENCOUNTER — Ambulatory Visit: Admit: 2022-11-27 | Discharge: 2022-11-28 | Payer: PRIVATE HEALTH INSURANCE

## 2022-11-27 DIAGNOSIS — G379 Demyelinating disease of central nervous system, unspecified: Principal | ICD-10-CM

## 2022-11-27 DIAGNOSIS — H538 Other visual disturbances: Principal | ICD-10-CM

## 2022-11-28 ENCOUNTER — Ambulatory Visit: Admit: 2022-11-28 | Discharge: 2022-11-29 | Payer: PRIVATE HEALTH INSURANCE

## 2022-11-28 DIAGNOSIS — M1712 Unilateral primary osteoarthritis, left knee: Principal | ICD-10-CM

## 2022-11-29 MED ORDER — OXYCODONE 5 MG TABLET
ORAL_TABLET | ORAL | 0 refills | 3 days | Status: CP | PRN
Start: 2022-11-29 — End: 2022-11-16
  Filled 2022-11-29: qty 10, 5d supply, fill #2

## 2022-12-02 DIAGNOSIS — G8929 Other chronic pain: Principal | ICD-10-CM

## 2022-12-02 DIAGNOSIS — M545 Chronic bilateral low back pain without sciatica: Principal | ICD-10-CM

## 2022-12-02 DIAGNOSIS — M2550 Pain in unspecified joint: Principal | ICD-10-CM

## 2022-12-02 DIAGNOSIS — R52 Pain, unspecified: Principal | ICD-10-CM

## 2022-12-02 MED ORDER — CYCLOBENZAPRINE 5 MG TABLET
ORAL_TABLET | Freq: Three times a day (TID) | ORAL | 0 refills | 30 days | Status: CP
Start: 2022-12-02 — End: 2023-01-01
  Filled 2022-12-05: qty 90, 30d supply, fill #0

## 2022-12-02 MED FILL — OXYCODONE 5 MG TABLET: ORAL | 5 days supply | Qty: 10 | Fill #3

## 2022-12-03 ENCOUNTER — Ambulatory Visit: Admit: 2022-12-03 | Discharge: 2022-12-04 | Payer: PRIVATE HEALTH INSURANCE

## 2022-12-03 DIAGNOSIS — R52 Pain, unspecified: Principal | ICD-10-CM

## 2022-12-05 MED ORDER — OXYCODONE 5 MG TABLET
ORAL_TABLET | Freq: Two times a day (BID) | ORAL | 0 refills | 5 days | Status: CP | PRN
Start: 2022-12-05 — End: ?
  Filled 2022-12-05: qty 10, 5d supply, fill #0

## 2022-12-05 MED FILL — NAPROXEN 500 MG TABLET: ORAL | 30 days supply | Qty: 60 | Fill #0

## 2022-12-05 MED FILL — VENTOLIN HFA 90 MCG/ACTUATION AEROSOL INHALER: RESPIRATORY_TRACT | 25 days supply | Qty: 18 | Fill #2

## 2022-12-05 MED FILL — FLUTICASONE PROPIONATE 50 MCG/ACTUATION NASAL SPRAY,SUSPENSION: NASAL | 60 days supply | Qty: 16 | Fill #2

## 2022-12-05 MED FILL — PREGABALIN 75 MG CAPSULE: ORAL | 30 days supply | Qty: 90 | Fill #0

## 2022-12-05 MED FILL — NALOXONE 4 MG/ACTUATION NASAL SPRAY: 1 days supply | Qty: 2 | Fill #2

## 2022-12-06 MED ORDER — OXYCODONE 5 MG TABLET
ORAL_TABLET | ORAL | 0 refills | 3 days | Status: CP | PRN
Start: 2022-12-06 — End: 2022-11-16

## 2022-12-10 ENCOUNTER — Ambulatory Visit
Admit: 2022-12-10 | Payer: PRIVATE HEALTH INSURANCE | Attending: Student in an Organized Health Care Education/Training Program | Primary: Student in an Organized Health Care Education/Training Program

## 2022-12-10 DIAGNOSIS — G894 Chronic pain syndrome: Principal | ICD-10-CM

## 2022-12-10 MED ORDER — OXYCODONE 5 MG TABLET
ORAL_TABLET | Freq: Two times a day (BID) | ORAL | 0 refills | 30.00000 days | Status: CP | PRN
Start: 2022-12-10 — End: 2022-12-10
  Filled 2022-12-10: qty 60, 30d supply, fill #0

## 2022-12-16 ENCOUNTER — Ambulatory Visit
Admit: 2022-12-16 | Discharge: 2022-12-17 | Payer: PRIVATE HEALTH INSURANCE | Attending: Physician Assistant | Primary: Physician Assistant

## 2022-12-16 DIAGNOSIS — G35 Multiple sclerosis: Principal | ICD-10-CM

## 2022-12-16 MED ORDER — VUMERITY 231 MG CAPSULE,DELAYED RELEASE
ORAL_CAPSULE | 0 refills | 0 days | Status: CP
Start: 2022-12-16 — End: ?

## 2022-12-16 MED ORDER — AMANTADINE HCL 100 MG CAPSULE
ORAL_CAPSULE | Freq: Two times a day (BID) | ORAL | 5 refills | 30 days | Status: CP
Start: 2022-12-16 — End: ?
  Filled 2022-12-16: qty 60, 30d supply, fill #0

## 2022-12-17 DIAGNOSIS — G35 Multiple sclerosis: Principal | ICD-10-CM

## 2022-12-17 DIAGNOSIS — J449 Chronic obstructive pulmonary disease, unspecified: Principal | ICD-10-CM

## 2022-12-20 DIAGNOSIS — J449 Chronic obstructive pulmonary disease, unspecified: Principal | ICD-10-CM

## 2022-12-20 MED ORDER — ALBUTEROL SULFATE HFA 90 MCG/ACTUATION AEROSOL INHALER
Freq: Four times a day (QID) | RESPIRATORY_TRACT | 11 refills | 0 days | Status: CP | PRN
Start: 2022-12-20 — End: 2022-12-20

## 2022-12-20 MED ORDER — VENTOLIN HFA 90 MCG/ACTUATION AEROSOL INHALER
Freq: Four times a day (QID) | RESPIRATORY_TRACT | 5 refills | 0 days | Status: CP | PRN
Start: 2022-12-20 — End: ?
  Filled 2022-12-30: qty 18, 25d supply, fill #0

## 2022-12-20 MED FILL — CETIRIZINE 10 MG TABLET: ORAL | 30 days supply | Qty: 30 | Fill #2

## 2022-12-20 MED FILL — NALOXONE 4 MG/ACTUATION NASAL SPRAY: 1 days supply | Qty: 2 | Fill #3

## 2022-12-20 MED FILL — DICLOFENAC 1 % TOPICAL GEL: TOPICAL | 25 days supply | Qty: 200 | Fill #3

## 2022-12-20 MED FILL — NICOTINE 21 MG/24 HR DAILY TRANSDERMAL PATCH: TRANSDERMAL | 28 days supply | Qty: 28 | Fill #1

## 2022-12-20 MED FILL — COLCHICINE 0.6 MG TABLET: ORAL | 30 days supply | Qty: 30 | Fill #2

## 2022-12-20 MED FILL — IPRATROPIUM 0.5 MG-ALBUTEROL 3 MG (2.5 MG BASE)/3 ML NEBULIZATION SOLN: RESPIRATORY_TRACT | 15 days supply | Qty: 180 | Fill #1

## 2022-12-20 MED FILL — ACETAMINOPHEN 500 MG TABLET: ORAL | 34 days supply | Qty: 200 | Fill #0

## 2022-12-23 MED ORDER — DIMETHYL FUMARATE 240 MG CAPSULE,DELAYED RELEASE
ORAL_CAPSULE | Freq: Two times a day (BID) | ORAL | 1 refills | 30 days | Status: CP
Start: 2022-12-23 — End: ?

## 2022-12-23 MED ORDER — DIMETHYL FUMARATE 120 MG (14)-240 MG (46) CAPSULE,DELAYED RELEASE
ORAL_CAPSULE | 0 refills | 0 days | Status: CP
Start: 2022-12-23 — End: ?
  Filled 2022-12-26: qty 14, 7d supply, fill #0

## 2022-12-23 NOTE — Unmapped (Signed)
[facility] Family Medicine Clinical Pharmacist Note    [facility]) to confirm if order for nebulizer received and approved. HCS rep confirmed order received on 4/2 and delivered to pt same day.    Called pt to confirm nebulizer supplies received and she stated she did get them last week as reported by [facility].    Explained no other updates on Libre 3 CGM sensors PA approval at this time. For some reason, reader was previously approved but sensors denied. Sineatha McCray LPN has been following recently and spoke with Medicaid rep last week that told her neither they or their supervisor was able to get CGM sensor approval to go through unless a formal appeal is submitted so documents to support the appeal were faxed at the end of last week and I plan to check back in with Sineatha this week about approval status.    [name], PharmD, CPP, [facility]  Family Medicine Clinical Pharmacist

## 2022-12-24 NOTE — Unmapped (Signed)
[facility] Specialty Medication Onboarding    Specialty Medication: Tecfidera starter and maintenance  Prior Authorization: Not Required   Financial Assistance: No - copay  <$25  Final Copay/Day Supply: $4 / 30    Insurance Restrictions: None     Notes to Pharmacist: None    The triage team has completed the benefits investigation and has determined that the patient is able to fill this medication at [facility]. Please contact the patient to complete the onboarding or follow up with the prescribing physician as needed.

## 2022-12-24 NOTE — Unmapped (Signed)
[facility]   Patient Onboarding/Medication Counseling    Tonya Wood is a 57 y.o. female with multiple sclerosis who I am counseling today on initiation of therapy.  I am speaking to the patient.    Was a Nurse, learning disability used for this call? No    Verified patient's date of birth / HIPAA.    Specialty medication(s) to be sent: Neurology: Dimethyl fumarate       Non-specialty medications/supplies to be sent: none      Medications not needed at this time: n/a         Tecfidera (dimethyl fumarate)    The patient declined counseling on medication administration, missed dose instructions, goals of therapy, side effects and monitoring parameters, warnings and precautions, drug/food interactions, and storage, handling precautions, and disposal because they were counseled in clinic. The information in the declined sections below are for informational purposes only and was not discussed with patient.       Medication & Administration     Dosage: Take 1 capsule (120mg ) by mouth twice daily for 7 days, then take 1 capsule (240mg ) twice daily thereafter    Administration: Swallow capsules whole without regards to food. Do not crush, chew, open or sprinkle the contents on food. Administrating with a high fat, high protein food may reduce the incidence of GI upset and flushing.    Adherence/Missed dose instructions: Take a missed dose as soon as you remember it. If it is close to the time of your next dose, skip the missed dose and resume your normal schedule. Do not take 2 doses at once or extra doses.    Goals of Therapy     Reduce the number of flare-ups and slow the progression of the disease    Side Effects & Monitoring Parameters     GI upset (abdominal pain, nausea, vomiting, diarrhea)  Flushing (taking with food or taking aspirin 30 minutes prior may reduce the frequency and severity of this effect)    The following side effects should be reported to the provider:  Signs of an allergic reaction  Signs of PML (mood changes, memory changes, confusion, vision changes, weakness on one side of the body, trouble speaking or thinking)  Signs of infections (fever, chills, sore throat)  Signs of liver problems (yellowing of the skin/eyes, dark urine, light-colored stools, severe abdominal pain/vomiting, loss of appetite, fatigue)      Contraindications, Warnings & Precautions     Hepatotoxicity - LFTs prior to treatment and during treatment as clinically necessary  PML - MRI prior to initiation and as clinically indicated during treatment  Leukopenia - CBC including lymphocyte count prior to initiation and every 3 months thereafter as clinically necessary    Drug/Food Interactions     Medication list reviewed in Epic. The patient was instructed to inform the care team before taking any new medications or supplements. No drug interactions identified.   Consider avoiding live vaccines during treatment (not explicitly contraindicated)     Storage, Handling Precautions, & Disposal     Store at room temperature in the original, labelled container.      Current Medications (including OTC/herbals), Comorbidities and Allergies     Current Outpatient Medications   Medication Sig Dispense Refill    acetaminophen (TYLENOL EXTRA STRENGTH) 500 MG tablet Take 2 tablets (1,000 mg total) by mouth Three (3) times a day. 200 tablet 11    amantadine HCL (SYMMETREL) 100 mg capsule Take 1 capsule (100 mg total) by mouth  two (2) times a day. 60 capsule 5    amlodipine (NORVASC) 10 MG tablet Take 1 tablet (10 mg total) by mouth daily. 90 tablet 3    aspirin (ECOTRIN) 81 MG tablet Take 1 tablet (81 mg total) by mouth daily.      azithromycin (ZITHROMAX) 250 MG tablet TAKE 2 TABLETS BY MOUTH ON DAY 1, AND THEN TAKE 1 TABLET BY MOUTH ONCE A DAY ON DAY 2 THROUGH DAY 5 (Patient not taking: Reported on 12/16/2022)      bisacodyl (DULCOLAX) 5 mg EC tablet Take 2 tablets (10 mg total) by mouth as directed for bowel prep. (Patient not taking: Reported on 12/16/2022) 2 tablet 0    blood sugar diagnostic Strp 1 each.      blood-glucose meter,continuous (FREESTYLE LIBRE 3 READER) Misc 1 each by Miscellaneous route once for 1 dose. 1 each 0    blood-glucose sensor (FREESTYLE LIBRE 3 SENSOR) Devi Change 1 sensor every fourteen (14) days. 6 each 3    carboxymethylcellulose sodium (REFRESH TEARS) 0.5 % Drop Administer 2 drops to both eyes four (4) times a day as needed. 30 mL 3    cetirizine (ZYRTEC) 10 MG tablet Take 1 tablet (10 mg total) by mouth daily. 90 tablet 3    colchicine (COLCRYS) 0.6 mg tablet Take 1 tablet (0.6 mg total) by mouth daily. 90 tablet 3    cyclobenzaprine (FLEXERIL) 5 MG tablet Take 1 tablet (5 mg total) by mouth Three (3) times a day. 90 tablet 0    diclofenac sodium (VOLTAREN) 1 % gel Apply 2 g topically four (4) times a day. 200 g 6    dimethyl fumarate 120 mg (14)- 240 mg (46) CpDR Take one starter dose (120 mg) capsule by mouth twice daily for 7 days. After one week, begin taking the regular dose (240 mg) twice daily. 60 capsule 0    dimethyl fumarate 240 mg CpDR Take 1 capsule (240 mg total) by mouth two (2) times a day. 60 capsule 1    divalproex ER (DEPAKOTE ER) 500 MG extended released 24 hr tablet Take 2 tablets (1,000 mg total) by mouth daily. (Patient not taking: Reported on 12/16/2022)      dulaglutide (TRULICITY) 4.5 mg/0.5 mL PnIj Inject 4.5 mg under the skin every seven (7) days. 6 mL 3    DULoxetine (CYMBALTA) 60 MG capsule Take 1 capsule (60 mg total) by mouth daily. 30 capsule 2    ezetimibe (ZETIA) 10 mg tablet Take 1 tablet (10 mg total) by mouth daily. 90 tablet 3    fluticasone propionate (FLONASE) 50 mcg/actuation nasal spray Use 1 spray into each nostril daily. 16 g 11    ibuprofen (MOTRIN) 600 MG tablet TAKE 1 TABLET BY MOUTH EVERY 6 HOURS AS NEEDED FOR PAIN FOR UP TO 10 DAYS      inhaler, assist devices (OPTICHAMBER, AEROCHAMBER, ADULT) Spcr Use as directed with inhalers 1 each 0    insulin aspart (NOVOLOG FLEXPEN) 100 unit/mL (3 mL) injection pen Inject 0.1 mL (10 Units total) under the skin two (2) times a day. Give 10-15 minutes before a meal. 15 mL 4    insulin glargine (BASAGLAR, LANTUS) 100 unit/mL (3 mL) injection pen Inject 0.56 mL (56 Units total) under the skin nightly. 15 mL 11    ipratropium-albuterol (DUO-NEB) 0.5-2.5 mg/3 mL nebulizer Inhale 3 mL (contents of one nebule) by nebulization every six (6) hours as needed. 180 mL 3    levothyroxine (SYNTHROID) 100  MCG tablet Take 1 tablet (100 mcg total) by mouth.      lidocaine-prilocaine (EMLA) 2.5-2.5 % cream Apply topically Three (3) times a day as needed. 30 g 0    lisinopril (PRINIVIL,ZESTRIL) 20 MG tablet Take 1 tablet (20 mg total) by mouth daily. 90 tablet 3    loratadine (CLARITIN) 10 mg tablet Take 1 tablet (10 mg total) by mouth daily. (Patient not taking: Reported on 12/16/2022)      magnesium oxide (MAG-OX) 400 mg (241.3 mg elemental magnesium) tablet Take 1 tablet (400 mg total) by mouth daily. 30 tablet 11    naloxone (NARCAN) 4 mg nasal spray Give single spray in one nostril.  Repeat with 2nd device in other nostril every 3 min if no or minimal response until 911 arrives 2 each 11    naproxen (NAPROSYN) 500 MG tablet Take 1 tablet (500 mg total) by mouth in the morning and 1 tablet (500 mg total) in the evening. Take with meals. 60 tablet 2    nicotine (NICODERM CQ) 21 mg/24 hr patch Place 1 patch on the skin daily. 28 patch 2    nicotine polacrilex (NICORETTE) 4 MG gum Take 1 each (4 mg total) by mouth every hour as needed. 110 each 2    ondansetron (ZOFRAN) 4 MG tablet Take 1 tablet (4 mg total) by mouth daily as needed for nausea. 30 tablet 1    oxyCODONE (ROXICODONE) 5 MG immediate release tablet Take 1 tablet (5 mg total) by mouth two (2) times a day as needed for pain. 60 tablet 0    [START ON 01/09/2023] oxyCODONE (ROXICODONE) 5 MG immediate release tablet Take 1 tablet (5 mg total) by mouth every four (4) hours as needed for pain for up to 11 days. 22 tablet 0    polyethylene glycol (CLEARLAX) 17 gram/dose powder Take as directed for extended bowel prep. (Patient not taking: Reported on 12/16/2022) 238 g 0    pregabalin (LYRICA) 75 MG capsule Take 1 capsule (75 mg total) by mouth Three (3) times a day. 90 capsule 2    QUEtiapine (SEROQUEL) 200 MG tablet TAKE 1 Tablet BY MOUTH ONCE EVERY EVENING      TECHLITE PEN NEEDLE 32 gauge x 1/4 (6 mm) Ndle       umeclidinium-vilanteroL (ANORO ELLIPTA) 62.5-25 mcg/actuation inhaler Inhale 1 puff daily. 180 each 3    varenicline (CHANTIX) 1 mg tablet       VENTOLIN HFA 90 mcg/actuation inhaler Inhale 2 puffs every six (6) hours as needed for wheezing. 18 g 5     No current facility-administered medications for this visit.       Allergies   Allergen Reactions    Statins-Hmg-Coa Reductase Inhibitors      Hold statin due to elevated CK       Patient Active Problem List   Diagnosis    Adjustment disorder with mixed anxiety and depressed mood    Amaurosis fugax of right eye    Cocaine abuse with cocaine-induced mood disorder (CMS-HCC)    Colonic polyp    COPD (chronic obstructive pulmonary disease) (CMS-HCC)    Essential hypertension    Hallux rigidus of right foot    Hyperlipidemia    Hypothyroidism    Impaired cognition    MDD (major depressive disorder), recurrent episode, severe (CMS-HCC)    Obesity    Onychomycosis    Post traumatic stress disorder (PTSD)    Right sided weakness    Schizoaffective disorder, bipolar  type (CMS-HCC)    Severe tobacco use disorder    Uncontrolled type 2 diabetes mellitus with hyperglycemia (CMS-HCC)    Healthcare maintenance    Knee pain    Allergic rhinitis    Ganglion cyst of dorsum of left wrist    Multiple sclerosis (CMS-HCC)       Reviewed and up to date in Epic.    Appropriateness of Therapy     Acute infections noted within Epic:  No active infections  Patient reported infection: None    Is medication and dose appropriate based on diagnosis and infection status? Yes    Prescription has been clinically reviewed: Yes      Baseline Quality of Life Assessment      How many days over the past month did your MS  keep you from your normal activities? For example, brushing your teeth or getting up in the morning. Patient declined to answer    Financial Information     Medication Assistance provided: Prior Authorization    Anticipated copay of $4 reviewed with patient. Verified delivery address.    Delivery Information     Scheduled delivery date: 12/27/22    Expected start date: 12/27/22    Medication will be delivered via Next Day Courier to the prescription address in [facility].  This shipment will not require a signature.      Explained the services we provide at [facility] and that each month we would call to set up refills.  Stressed importance of returning phone calls so that we could ensure they receive their medications in time each month.  Informed patient that we should be setting up refills 7-10 days prior to when they will run out of medication.  A pharmacist will reach out to perform a clinical assessment periodically.  Informed patient that a welcome packet, containing information about our pharmacy and other support services, a Notice of Privacy Practices, and a drug information handout will be sent.      The patient or caregiver noted above participated in the development of this care plan and knows that they can request review of or adjustments to the care plan at any time.      Patient or caregiver verbalized understanding of the above information as well as how to contact the pharmacy at [phone] option 4 with any questions/concerns.  The pharmacy is open Monday through Friday 8:30am-4:30pm.  A pharmacist is available 24/7 via pager to answer any clinical questions they may have.    Patient Specific Needs     Does the patient have any physical, cognitive, or cultural barriers? No    Does the patient have adequate living arrangements? (i.e. the ability to store and take their medication appropriately) Yes    Did you identify any home environmental safety or security hazards? No    Patient prefers to have medications discussed with  Patient     Is the patient or caregiver able to read and understand education materials at a high school level or above? Yes    Patient's primary language is  English     Is the patient high risk? No    SOCIAL DETERMINANTS OF HEALTH     At the [facility], we have learned that life circumstances - like trouble affording food, housing, utilities, or transportation can affect the health of many of our patients.   That is why we wanted to ask: are you currently experiencing any life circumstances that are  negatively impacting your health and/or quality of life? Patient declined to answer    Social Determinants of Health     Financial Resource Strain: Not on file   Internet Connectivity: Not on file   Food Insecurity: Not on file   Tobacco Use: High Risk (12/16/2022)    Patient History     Smoking Tobacco Use: Every Day     Smokeless Tobacco Use: Never     Passive Exposure: Not on file   Housing/Utilities: Not on file   Alcohol Use: Not At Risk (07/17/2021)    Received from [facility]    AUDIT-C     Frequency of Alcohol Consumption: Never     Average Number of Drinks: Not on file     Frequency of Binge Drinking: Never   Transportation Needs: Not on file   Substance Use: Not on file   Health Literacy: Not on file   Physical Activity: Not on file   Interpersonal Safety: Not on file   Stress: Not on file   Intimate Partner Violence: Not on file   Depression: None or minimal depression (07/27/2021)    Received from [facility]    PHQ-9     (OBSOLETE) PHQ-9 Total Score=: 0   Social Connections: Not on file       Would you be willing to receive help with any of the needs that you have identified today? Not applicable       Arnold Long, PharmD  [facility] Specialty Pharmacist

## 2022-12-26 MED FILL — DIMETHYL FUMARATE 240 MG CAPSULE,DELAYED RELEASE: ORAL | 30 days supply | Qty: 60 | Fill #0

## 2022-12-26 NOTE — Unmapped (Signed)
Good afternoon, patient is calling in requesting to speak with her SW, she states that transportation Is stating that they have not received faxes and now they have provided her a new faxing number. Patient would like to know if the forms could be re-faxed to new and old fax numbers #[phone] and [phone]. Patient is also requesting a call back. Thank you!

## 2022-12-27 NOTE — Unmapped (Signed)
Care Management Progress Note  [facility]             Purpose of contact:         SW followed up on patient request to assist with medicaid transportation.    Additional Information/Plan:  Patient would like transportation information faxed to #[phone] and [phone]. SW sent faxes as requested, and called patient to update her.     Patient provided my direct contact information and encouraged to contact me should additional needs arise.    Harjit Leider, LCSW, CCM  Population Health  [facility]  Ph: [phone]

## 2022-12-30 MED FILL — TRULICITY 4.5 MG/0.5 ML SUBCUTANEOUS PEN INJECTOR: SUBCUTANEOUS | 28 days supply | Qty: 2 | Fill #2

## 2022-12-30 MED FILL — LANTUS SOLOSTAR U-100 INSULIN 100 UNIT/ML (3 ML) SUBCUTANEOUS PEN: SUBCUTANEOUS | 26 days supply | Qty: 15 | Fill #1

## 2022-12-31 MED ORDER — CHOLECALCIFEROL (VITAMIN D3) 1,250 MCG (50,000 UNIT) CAPSULE
ORAL_CAPSULE | ORAL | 0 refills | 168 days | Status: CP
Start: 2022-12-31 — End: ?
  Filled 2023-01-09: qty 24, 168d supply, fill #0

## 2022-12-31 NOTE — Unmapped (Signed)
Spoke with Tonya Wood regarding upcoming GI procedure on 01/03/23 at [facility] location at 1230 with an arrival time of 1130.     Verified that [facility] prep instructions had been received, reviewed and understood.      Verified prep medications ordered/received.    Reviewed information regarding holding lisinopril and short acting insulins day of procedure (until after procedure), taking half dose of long acting insulin day before procedure and holding day of procedure as well as holding vitamins/supplements the day before and day of procedure.     Reviewed low fiber diet should have started six days prior to procedure.    Reviewed LIQUID diet requirement two entire days prior to procedure as well as NOTHING AT ALL 2 hours prior to procedure    Reviewed driver requirement (18 or older, must be able to drive patient home, must remain within 20 minutes of facility and reachable by cell phone).      No further questions at this time.

## 2023-01-01 ENCOUNTER — Ambulatory Visit
Admit: 2023-01-01 | Discharge: 2023-01-02 | Payer: PRIVATE HEALTH INSURANCE | Attending: Student in an Organized Health Care Education/Training Program | Primary: Student in an Organized Health Care Education/Training Program

## 2023-01-01 DIAGNOSIS — F332 Major depressive disorder, recurrent severe without psychotic features: Principal | ICD-10-CM

## 2023-01-01 DIAGNOSIS — Z9189 Other specified personal risk factors, not elsewhere classified: Principal | ICD-10-CM

## 2023-01-01 DIAGNOSIS — G35 Multiple sclerosis: Principal | ICD-10-CM

## 2023-01-01 DIAGNOSIS — Z5181 Encounter for therapeutic drug level monitoring: Principal | ICD-10-CM

## 2023-01-01 DIAGNOSIS — M25562 Pain in left knee: Principal | ICD-10-CM

## 2023-01-01 DIAGNOSIS — E1159 Type 2 diabetes mellitus with other circulatory complications: Principal | ICD-10-CM

## 2023-01-01 DIAGNOSIS — F4323 Adjustment disorder with mixed anxiety and depressed mood: Principal | ICD-10-CM

## 2023-01-01 DIAGNOSIS — E039 Hypothyroidism, unspecified: Principal | ICD-10-CM

## 2023-01-01 DIAGNOSIS — G8929 Other chronic pain: Principal | ICD-10-CM

## 2023-01-01 DIAGNOSIS — Z794 Long term (current) use of insulin: Principal | ICD-10-CM

## 2023-01-01 LAB — ALBUMIN / CREATININE URINE RATIO
ALBUMIN QUANT URINE: 1.7 mg/dL
ALBUMIN/CREATININE RATIO: 23.8 ug/mg (ref 0.0–30.0)
CREATININE, URINE: 71.5 mg/dL

## 2023-01-01 LAB — TOXICOLOGY SCREEN, URINE
AMPHETAMINE SCREEN URINE: NEGATIVE
BARBITURATE SCREEN URINE: NEGATIVE
BENZODIAZEPINE SCREEN, URINE: NEGATIVE
BUPRENORPHINE, URINE SCREEN: NEGATIVE
CANNABINOID SCREEN URINE: NEGATIVE
COCAINE(METAB.)SCREEN, URINE: NEGATIVE
FENTANYL SCREEN, URINE: NEGATIVE
METHADONE SCREEN, URINE: NEGATIVE
OPIATE SCREEN URINE: NEGATIVE
OXYCODONE SCREEN URINE: NEGATIVE

## 2023-01-01 LAB — TSH: THYROID STIMULATING HORMONE: 3.012 u[IU]/mL (ref 0.550–4.780)

## 2023-01-01 LAB — HEMOGLOBIN A1C
ESTIMATED AVERAGE GLUCOSE: 117 mg/dL
HEMOGLOBIN A1C: 5.7 % — ABNORMAL HIGH (ref 4.8–5.6)

## 2023-01-01 NOTE — Unmapped (Signed)
[facility]    Problem List Items Addressed This Visit          Endocrine    Hypothyroidism    Relevant Orders    TSH    Type 2 diabetes mellitus (CMS-HCC)    Relevant Orders    Hemoglobin A1c    Albumin/creatinine urine ratio       Nervous and Auditory    Multiple sclerosis (CMS-HCC)       Other    Adjustment disorder with mixed anxiety and depressed mood - Primary    MDD (major depressive disorder), recurrent episode, severe (CMS-HCC)    Knee pain    Relevant Orders    Ambulatory referral to Orthopedic Surgery    Toxicology Screen, Urine    Opiates confirmation, random, urine     Other Visit Diagnoses       Therapeutic drug monitoring        Relevant Orders    Toxicology Screen, Urine    Opiates confirmation, random, urine    Poor dental hygiene        Relevant Orders    Ambulatory referral to Dentistry            ASSESSMENT/PLAN:       Follow up: pain    #Chronic pain  -  Difficult to control chronic pain initially started with back pain and now most severe in feet, knees, and R shoulder. Suspect that etiology of pain is multifactorial including OA, MS (although less likely after discussion with neurology), mood disorder, MSK. Currently on multimodal pain management including colchicine, flexeril, voltaren gel, cymbalta, ibuprofen, pregabalin. Have had extensive discussion regarding risks of long term opioid use and sensitization of pain. Concern for patient's inconsistent use of oxy - using more on some days and running out frequently - and changing history for opioid use. I am concerned that patient may be developing dependence given her inconsistent use and stark changes in her pain presentation (able to talk on the phone without pain and then in uncontrollable pain when talking with provider) during the visit. Discussed that I cannot fill her oxycodone early even if she runs out. She last took oxy Friday or Sat so may not be present in urine drug screen. Reviewed proper use of narcan. Discussed that I would recommend working on an oxycodone taper which is in line with her wishes of decreasing her polypharmacy. Will explore her expectation for pain as this could be contributing to her pain severity and medication needs. Pain med visit scheduled for 01/07/23. Emphasized importance of this visit.  - Repeat L knee XR notable for subchondral sclerosis and progressed joint space narrowing consistent with OA. Will send to ortho for re-evaluation for surgical intervention given severity of pain, recurrent effusions, and change in imaging findings.  - Procedure for R shoulder pain (likely myofascial pain syndrome) performed in clinic today with good response to pain.    #Dental hygiene  - was told she needs to have her molar removed but needs general anesthesia d/t pain intolerance. Referral to dentistry placed today.    #Ear nodules  - painful nodules in ear - other issues took precedence. Will address at follow-up.    #Depression  - Will address at follow up visit with pt. No active SI/HI. Mainly pain related.    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 Started on DMT.    #Hypothyroidism  -  will check TSH today    #T2DM  - A1C today.    SUBJECTIVE:  HPI: Tonya Wood is a 57 y.o. female who presents to clinic today for the following issues:  - Friday night pain from R neck to right arm (pain feels burning, sharp)  - Pain in R toe (last swollen this past weekend)  - L knee pain  - pain medicne for nerve  - some numbness along R arm (some wekaness)    - oxycodone helping (BID); started taking oxycodone 3-4x last week (ran out). Pt reports she called the clinic numerous times due to the severity of her shoulder pain and nurse told her to take more of her pain medication. Could not find documentation of phone calls.    OBJECTIVE:  BP 119/77 (BP Site: R Arm, BP Position: Sitting, BP Cuff Size: Large)  - Pulse 92  - Temp 35.9 ??C (96.7 ??F) (Temporal)  - Ht 170.2 cm (5' 7)  - Wt 95.3 kg (210 lb)  - BMI 32.89 kg/m??       Physical Exam  Constitutional: Alert and oriented to person, place, and time. In pain intermittently.  Eyes: Conjunctivae are normal.    Hematological/Lymphatic/Immunilogical: No cervical lymphadenopathy.  Cardiovascular: Normal rate, regular rhythm. Normal and symmetric distal pulses are present in all extremities.   Respiratory: Normal respiratory effort. Breath sounds are normal bilaterally with no wheezes, rales, or rhonchi.  Musculoskeletal: Nontender with normal range of motion in all extremities.       Right lower leg: No tenderness or edema.       Left lower leg: No tenderness or edema.  Neurologic: Normal speech and language. No gross focal neurologic deficits are appreciated. +1 Bicep reflex. Intact sensation and strength.   Skin: Skin is warm, dry and intact. No rash noted.  Psychiatric: Mood and affect are normal. Speech and behavior are normal.    Janene Madeira, MD   [facility]        [facility]  [address] [place], [place] [address]    Telephone [phone]  Fax [phone]  [email or web address]      Trigger Point Injection Procedure Note:    Verbal consent.  Risks, benefits, and alternatives discussed.  Sterile prep.  Location (s): R shoulder  # trigger points injected: 2  Needle: 25 G 1.5 inch  Meds: Total of 5 cc 1% lidocaine, with 2.5 cc distributed at each trigger point  Sterile bandage(s) applied.  Tolerated well without complication.

## 2023-01-02 NOTE — Unmapped (Signed)
Patient Advice Request     Patient Name: Tonya Wood  Caller: Self (Patient)  Contact Method: Telephone Call: Time- Any Time [phone]   Reason for Call: Pt is in excruciating pain. Pt seen PCP 4/17 and received injections. Pt would like a call back asap.   Previously Discussed: yes  Appointment Offered: Declined

## 2023-01-02 NOTE — Unmapped (Signed)
Spoke with pt over the phone, Pt is c/o 9/10 pain on her R shoulder. Pt was seen yesterday and received trigger point injection at the R shoulder, pt states she received pain relief however the pain came back 4 hrs later. Pt is requesting oxycodone sooner refills. Informed pt that PCP would be made aware. Advised pt to apply heating pad to the pain site and perform stretching, informed pt if the pain does not resolve or improve to report to the nearest urgent care or ER. Pt verbalized understanding and agrees with the above.

## 2023-01-03 ENCOUNTER — Ambulatory Visit: Admit: 2023-01-03 | Discharge: 2023-01-07 | Payer: PRIVATE HEALTH INSURANCE

## 2023-01-03 ENCOUNTER — Ambulatory Visit: Admit: 2023-01-07 | Discharge: 2023-01-08 | Payer: PRIVATE HEALTH INSURANCE

## 2023-01-03 DIAGNOSIS — M7918 Myalgia, other site: Principal | ICD-10-CM

## 2023-01-03 DIAGNOSIS — G894 Chronic pain syndrome: Principal | ICD-10-CM

## 2023-01-03 LAB — OPIATE, URINE, QUANTITATIVE
6-MONOACETYLMRPH: <5 ng/mL (ref <5)
BUPRENORPHINE: <5 ng/mL (ref <5)
CODEINE GC/MS CONF: <25 ng/mL (ref <25)
FENTANYL, URINE GC/MS: <0.5 ng/mL (ref <0.5)
HYDROCODONE GC/MS CONF: <25 ng/mL (ref <25)
HYDROMORPHONE GC/MS CONF: <25 ng/mL (ref <25)
MORPHINE GC/MS CONF: <25 ng/mL (ref <25)
NORBUPRENORPHINE: <5 ng/mL (ref <5)
NORFENTANYL, UR GC/MS: <1 ng/mL (ref <1.0)
OPIATE INTERP: NEGATIVE
OXYCODONE (GC/MS): <25 ng/mL (ref <25)
OXYMORPHONE: <25 ng/mL (ref <25)

## 2023-01-03 MED ADMIN — lactated Ringers infusion: 10 mL/h | INTRAVENOUS | @ 17:00:00

## 2023-01-03 NOTE — Unmapped (Signed)
Brief Update Note    Tonya Wood was scheduled for a diagnostic colonoscopy today for a positive FIT.  She did not complete bowel preparation, but presented to the endoscopy center for procedure.  We discussed that I would be willing to attempt colonoscopy for her, but that there was an elevated risk that her bowel preparation would be inadequate and that I would need to abort the procedure.  She initially wanted to proceed with procedure, but while completing the previous case told her nurse that she no longer wanted her procedure and left the endoscopy center.    She continues to need diagnostic colonoscopy, and should be booked for next available date with repeat bowel preparation.    Marylou Mccoy, MD  Advanced Inflammatory Bowel Diseases Fellow

## 2023-01-03 NOTE — Unmapped (Signed)
Already spoke to another nurse

## 2023-01-03 NOTE — Unmapped (Signed)
Pt arrived at urgent care requesting oxycodone which she has received previously from her pcp. In her last visit with PCP 2 days ago Dr Selena Batten wrote: "Currently on multimodal pain management including colchicine, flexeril, voltaren gel, cymbalta, ibuprofen, pregabalin. Have had extensive discussion regarding risks of long term opioid use and sensitization of pain. Concern for patient's inconsistent use of oxy - using more on some days and running out frequently - and changing history for opioid use. I am concerned that patient may be developing dependence given her inconsistent use and stark changes in her pain presentation (able to talk on the phone without pain and then in uncontrollable pain when talking with provider) during the visit. Discussed that I cannot fill her oxycodone early even if she runs out."    We did not see the pt, PCP notified

## 2023-01-03 NOTE — Unmapped (Signed)
Pre-procedure call made and RN spoke with patient.    Denies recent infection/antibiotics.   IS DIABETIC  OK TO CONTINUE ASA 81MG    Denies any electronic implants          Patient informed to arrive 30 minutes before procedure appointment time.  Patient verbalized understanding to all.

## 2023-01-04 ENCOUNTER — Ambulatory Visit: Admit: 2023-01-04 | Discharge: 2023-01-05 | Payer: PRIVATE HEALTH INSURANCE

## 2023-01-04 NOTE — Unmapped (Signed)
Called pt to discuss lab results and pain. Pt's UDS and confirmatory opiate screening neg for oxycodone although she ran out of the medicine 3-4 days prior to the UDS. Pt endorsing severe R shoulder pain that was improved after trigger point injection to traps. Still feel that her pain is myofascial pain syndrome. Pt to be seen at [facility] tomorrow for repeat trigger point injections. Discussed that I have placed oxy refills 4/26 and future refills are contingent on concordant UDS with prescriptions. We discussed slowly weaning off of oxy. Pt is in agreement with plan and would prefer local injections over oxy if possible. Will also place urgent PT order for dry needling to help with myofascial pain syndrome. Will also message team to help pt schedule with sports med for repeat trigger point injection to her R shoulder.     Also discussed that her A1C is at goal! Will start insulin wean. Pt reports AM fasting sugars in 100-110's and evening also 110's. Likely will wean meal time insulin first. Counseled pt to check fasting and 2h post prandial sugars daily and to bring to next visit to help with insulin titration.

## 2023-01-04 NOTE — Unmapped (Signed)
Immediately after or during the visit, I reviewed with the resident the medical history and the residents findings on physical examination.  I discussed with the resident the patients diagnosis and concur with the treatment plan as documented in the resident note. Earnie Bechard P Sadao Weyer, DO

## 2023-01-04 NOTE — Unmapped (Signed)
[facility]  Established Patient Clinic Note    Assessment/Plan:   Tonya Wood is a 57 y.o.female    Problem List Items Addressed This Visit    None  Visit Diagnoses       Spasm of right trapezius muscle    -  Primary    Trigger Point Injection Procedure Note:  After discussion of risks, benefits, and alternatives, verbal consent was obtained.  The trigger point/ taut band of muscle was palpated and point of maximal tenderness was marked. R trapezius muscle/levator scapular  The area(s) was then prepped with chlorhexidine. Total of 1 cc 1% lidocaine (without epinephrine) used, with 0.5 cc distributed at each trigger point, followed by dry needling.   The area was then massaged and the muscle was notably less tense upon palpation.   Sterile bandage(s) applied. The patient tolerated the procedure well and there were no complications.    Recommended heating pads, stop voltaren gel as it is not helping. Lidocaine patches not to be used at the same time as heating pads. Alternate tylenol and ibuprofen. Follow up with sports medicine and PT.               follow up  Future Appointments   Date Time Provider Department Center   01/07/2023  8:30 AM Caffie Pinto, MD [facility]   01/21/2023  4:20 PM Janene Madeira, MD [facility]   02/20/2023  1:20 PM Janene Madeira, MD [facility]   03/03/2023  9:30 AM Mellody Life, Nilda Simmer, AGNP [facility]   04/21/2023 10:00 AM Worthy Flank, CPP [facility]   07/18/2023  9:30 AM Cy Blamer, PA [facility]       Attending: Dr. Bari Edward    Subjective   Tonya Wood is a 57 y.o. female  coming to clinic today for the following issues:    Chief Complaint   Patient presents with    Shoulder Pain     Right shoulder     Knee Pain     Left knee      HPI:    Per PCP pt requesting refill of oxycodone and UDS  neg for oxycodone although she ran out of the medicine 3-4 days prior to the UDS. Requesting refill for persistent shoulder pain. Pt and PCP discussed lack of indication for oxycodone and plan to wean off. Pcp also placed referral for PT w/ dry needling.   Pt endorsing severe R shoulder pain that was improved after trigger point injection to traps. Pt requesting repeat trigger point injections.  Was using a heating pad, but it doesn't work any more. Taking tylenol and not getting any relief. Voltaren gel as well, but not getting much relief.     I have reviewed the problem list, medications, and allergies and have updated/reconciled them if needed.    Tonya Wood  reports that she has been smoking cigarettes. She started smoking about 37 years ago. She has a 74.4 pack-year smoking history. She has never used smokeless tobacco.  Health Maintenance   Topic Date Due    Zoster Vaccines (1 of 2) Never done    Retinal Eye Exam  04/24/2021    Colon Cancer Screening  05/22/2023    Hemoglobin A1c  07/03/2023    Serum Creatinine Monitoring  09/26/2023    Potassium Monitoring  09/26/2023    Lung Cancer Screening Shared Decision Making  12/03/2023    Foot Exam  01/01/2024    Urine Albumin/Creatinine Ratio  01/01/2024    Mammogram Start Age 13  11/19/2024    HPV Cotest with Pap Smear (21-65)  01/16/2026    Pap Smear with Cotest HPV (21-65)  01/16/2026    COPD Spirometry  05/09/2027    Lipid Screening  09/26/2027    DTaP/Tdap/Td Vaccines (3 - Td or Tdap) 05/04/2029    Pneumococcal Vaccine 0-64  Completed    Hepatitis C Screen  Completed    COVID-19 Vaccine  Completed    Influenza Vaccine  Completed       Objective     VITALS: BP 112/75  - Pulse 83  - Temp 36.4 ??C (97.6 ??F) (Oral)  - Wt 95.3 kg (210 lb)  - BMI 32.89 kg/m??     Physical Exam  General: well-appearing, sitting upright in no acute distress  Head: Normocephalic, atraumatic  ENT: No dental trauma noted.   Eyes: conjunctiva normal, non-erythematous, non-icteric, no discharge.  Neck: no thyroid enlargement or masses  Lungs: No increased work of breathing or audible wheezing  Skin: Warm, dry, no erythema or rash on exposed skin  Musculoskeletal: No visible gait abnormalities Notable tension in R trapezius muscles  Neurologic: Alert, no gross sensorimotor abnormalities  Psychiatric: Pleasant, cooperative, good eye contact, appropriate thought processes       LABS/IMAGING  I have reviewed pertinent recent labs and imaging in Epic    [facility]  [place] [facility] [place] [facility]  [address] [place], [place] [address]  Telephone [phone]  Fax [phone]  [email or web address]

## 2023-01-04 NOTE — Unmapped (Signed)
Stop using voltaren gel and start using lidocaine patches.   You can alternate between tylenol and ibuprofen.

## 2023-01-06 NOTE — Unmapped (Signed)
PROCEDURE; left knee genicular nerves block  PRE-PROCEDURE DIAGNOSIS: Osteoarthritis  POST-PROCEDURE DIAGNOSIS: Same    PERFORMED BY: Fayrene Fearing  ASSISTANT: Jerrell Mylar    INTERIM HISTORY:     Tonya Wood is a 57 y.o. 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 [facility].  Patient is here today for left genicular nerve block with steroid.     On exam, patients knee has multiple open wounds however none are near the sites of injection. Patient states she gets sores when she wears her brace however denies having any fluid or purulent discharge from these wounds.      DESCRIPTION OF THE PROCEDURE:    A left genicular nerves block procedure was decided to be performed.   Informed consent was obtained and potential risks discussed including, but not limited to: bleeding, bruising, severe allergic reaction to components of the injection materials, infection, nerve damage, the possibility of no benefit (pain relief) derived from the injection, or in rare occasions worsening of pain. Questions were answered to the patient's satisfaction and the patient wishes to proceed.  Alternative options for treatment have previously been discussed and explored with the patient. The patient is not taking antiplatelet or anticoagulation medications and does  have a driver today.    An PIV was not placed and the patient was taken to the procedure room and placed supine with blankets under the knee. The procedure was performed under fluoroscopic guidance.  We first took an AP view to see the distal femur, knee joint, and proximal tibia. The patient's heart rate, blood pressure, EKG and oxygen saturation were continuously monitored throughout the procedure. A timeout was performed and the correct side confirmed with the patient. Sedation was not given.    The left knee was sterilely prepped with chlorhexidinex2 and sterilely draped. The physician wore a hat, mask and sterile gloves.  A large clamp was used to mark the entry sites, which were then anesthetized with 0.5% lidocaine using a 1.5 inch 25 gauge needle. Patient was extremely anxious and sensitive during local anesthesia. Patient would yell while physician would touch the knee in fear/ anticipation of next step. We discussed that we can abort procedure currently or at any time however she wanted to proveed.  25 gauge 3.5 inch spinal needles were inserted to the superior lateral geniculate nerve where the lateral femoral shaft meets the epicondyle, the superior medial geniculate nerve where the medial femoral shaft meets the epicondyle, and the inferior medial geniculate nerve where the medial tibial shaft meets the epicondyle using AP, then lateral fluroscopic view for visualization. Negative aspiration was confirmed. Then 1 ml of a solution of 2% lidocaine and 40 mg of 40 mg/ml of kenalog was injected and the needles were removed intact.    The patient was extremely anxious throughout the entire procedure and there were no apparent complications. All injection sites were sterilely dressed. The patient was discharged after an appropriate period of observation and post procedural education was given.    Total Fluoroscopic time 8 seconds.  Pre-procedure pain score was 9/10  Post-procedure pain score was 8/10    DISPOSITION:     Would not be a candidate for a RFA in the future    Follow up PRN  No orders of the defined types were placed in this encounter.      Requested Prescriptions      No prescriptions requested or ordered in this encounter

## 2023-01-06 NOTE — Unmapped (Signed)
Confirmed PA approval for Libre 3 CGM sensors through 07/05/23.    Called [facility] and confirmed Rx's for both [place] 3 CGM Reader + Sensors going through insurance as approved. Will need to order but should be ready for pick up tomorrow (4/23).    [name], PharmD, CPP, [facility]  Family Medicine Clinical Pharmacist

## 2023-01-06 NOTE — Unmapped (Signed)
[facility] Clinical Pharmacist Note    Spoke with [facility] Medicaid rep on 01/03/23 (call reference # [id number]) who confirmed PA approval for Libre 3 CGM Reader on 11/26/22. Noted it appeared there were several PA submission for the CGM Reader that were all denied due to the previous approval. However, PA denied for Libre 3 CGM Sensors on 11/26/22 due to pt not meeting medical necessity (documentation did not support pt meeting insurance criteria). Suspect documentation did not transmit effectively since it clearly supported pt meeting medical necessity for the CGM Reader. Insurance rep also noted there were not multiple PA resubmissions for the CGM Sensors like with the Reader so wonders if subsequent resubmissions were inadvertently sent for the CGM Reader instead. Rep recommended resubmitting PA for CGM Sensors with additional documentation referencing PA approval for CGM Reader and supporting documentation for meeting medical necessity.    Due to significant delays in CGM PA approval/appeal through [facility] insurance since early March 2024, submitted new PA for [place] 3 CGM Sensors via [employer] (Key: [id number]) today (01/06/23).    [name], PharmD, CPP, [facility]  Family Medicine Clinical Pharmacist

## 2023-01-07 DIAGNOSIS — G894 Chronic pain syndrome: Principal | ICD-10-CM

## 2023-01-07 DIAGNOSIS — M1712 Unilateral primary osteoarthritis, left knee: Principal | ICD-10-CM

## 2023-01-07 MED ADMIN — lidocaine (XYLOCAINE) 20 mg/mL (2 %) injection 5 mL: 5 mL | @ 13:00:00 | Stop: 2023-01-07

## 2023-01-07 MED ADMIN — triamcinolone acetonide (KENALOG-40) injection 40 mg: 40 mg | INTRA_ARTICULAR | @ 13:00:00 | Stop: 2023-01-07

## 2023-01-07 NOTE — Unmapped (Signed)
Discussed with pt over the phone. See phone note

## 2023-01-07 NOTE — Unmapped (Signed)
POST PROCEDURE INSTRUCTIONS   You may apply an ice pack 20-30 minutes at a time to the injection site if you experience soreness.     Keep the injection site clean and dry. You make remove the band-aid one day following the procedure.     You may take a shower but AVOID getting in to baths, pools or whirlpools for 48 HOURS AFTER THE PROCEDURE.     Remember that it takes a few days, even up to a week for the steroid medicine to reduce inflammation and pain. If you are diabetic, the steroid medicine may cause your blood sugar levels to increase. Please contact your internist regarding medication adjustments.    ACTIVITY   Refrain from heavy activity for the next 24 to 48 hours. General walking is okay. You may resume your normal activities the day following the procedure.   You may start or resume your individualized exercise program or physical therapy 48 hours after the procedure.   MEDICATIONS   Please note that it is okay to continue other prescribed medications (blood pressure, insulin, water pill, depression/anxiety pill, etc.) as well as other prescribed pain medications such as Neurontin, Lyrical, Celebrex, Ultram, Vicodin, Norco and acetaminophen (Tylenol).   SIDE EFFECTS   Increase in pain during the first 24 to 48 hours.     Trouble sleeping during the first one or two nights after the procedure-this is an effect of the steroid medicine.     Facial flushing (it feels like you have a fever, but you do not) - this is an effect of the steroid medicine.     You might experience:   1. Mild to moderate swelling at the joint.   2. Possible bruising at the injection site.     WHEN TO CALL THE DOCTOR/NURSE   Severe pain, worse or different that the pain you had before the procedure.     Fever or chills.     Redness, or swelling around the injection site.     Call the Pain Management  Procedural Nurses [phone] during normal business hours (7 am-3 pm). If it is AFTER HOURS or during a weekend or holiday, call the hospital operator and ask for the Anesthesia Pain physician on call at [phone].   FOR EMERGENCIES, CALL 911 OR GO TO THE NEAREST HOSPITAL EMERGENCY DEPARTMENT.   ?   TO SCHEDULE APPOINTMENTS OR FOR QUESTIONS RELATED TO MEDICATIONS   Call the Pain Management Clinic at [phone]

## 2023-01-07 NOTE — Unmapped (Signed)
Reviewed AVS instructions with patient and she verbalized understanding to all. Patient reports she started a new MS medication at night (makes her sleepy during day). She states her glucose has been running lower. AM glucose 74. She states she at breakfast. Patient checked glucose in procedure holding which was 116.

## 2023-01-07 NOTE — Unmapped (Signed)
Patient Advice Request     Patient Name: Tonya Wood  Caller: Self (Patient)  Contact Method: Telephone Call: Time- Any Time [phone]   Reason for Call: Patient called and advised her oxyCODONE (ROXICODONE) 5 MG immediate release tablet was sent to wrong pharmacy. Patient is requesting medication be sent to [facility] outpatient pharmacy [facility] so she can pick it up.   Previously Discussed: no  Appointment Offered: No

## 2023-01-08 NOTE — Unmapped (Signed)
I was present for the entirety of the procedure(s).

## 2023-01-09 ENCOUNTER — Ambulatory Visit
Admit: 2023-01-09 | Discharge: 2023-01-10 | Payer: PRIVATE HEALTH INSURANCE | Attending: Family Medicine | Primary: Family Medicine

## 2023-01-09 DIAGNOSIS — E1159 Type 2 diabetes mellitus with other circulatory complications: Principal | ICD-10-CM

## 2023-01-09 DIAGNOSIS — Z794 Long term (current) use of insulin: Principal | ICD-10-CM

## 2023-01-09 MED ORDER — LANCETS
11 refills | 0 days | Status: CP
Start: 2023-01-09 — End: 2024-01-09
  Filled 2023-01-15: qty 102, 34d supply, fill #0

## 2023-01-09 MED ORDER — BLOOD GLUCOSE TEST STRIPS
ORAL_STRIP | 11 refills | 0 days | Status: CP
Start: 2023-01-09 — End: 2024-01-09
  Filled 2023-01-09: qty 2, 28d supply, fill #0

## 2023-01-09 MED ORDER — OXYCODONE 5 MG TABLET
ORAL_TABLET | ORAL | 0 refills | 4.00000 days | Status: CP | PRN
Start: 2023-01-09 — End: 2022-12-10
  Filled 2023-02-07: qty 60, 30d supply, fill #0

## 2023-01-09 NOTE — Unmapped (Signed)
[facility]    Subjective: 57 y.o. female with  has a past medical history of AKI (acute kidney injury) (CMS-HCC) (04/22/2019), Bronchitis, COPD (chronic obstructive pulmonary disease) (CMS-HCC), Depression, Diabetes (CMS-HCC) (05/19/2019), Diabetes mellitus (CMS-HCC), Emphysema lung (CMS-HCC), Hyperglycemic hyperosmolar nonketotic coma (CMS-HCC) (04/22/2019), Hypertension, Hyponatremia (04/22/2019), MDD (major depressive disorder), severe (CMS-HCC) (05/19/2019), Moderate episode of recurrent major depressive disorder (CMS-HCC) (12/28/2021), Odontogenic infection of jaw (12/25/2018), Severe cocaine use disorder (CMS-HCC) (05/07/2019), Smoker (12/27/2019), and Unsteady gait (06/02/2015). who presents for:    Patient seen at the request of Janene Madeira, MD or Self, Referred for Shoulder Pain (Right )    Just got L knee genic nerve block 2 days ago.    Got TPI by Dr. Lamar Blinks 01/04/24. Got TPI R trap by PCP 01/01/23. Both of these helped x 3-4 hours    I did R AC inj 11/15/22 that helped for a few hours.    Red flags: none    All relevant medical chart documents reviewed.    Objective:   Wt 95.3 kg (210 lb)  - BMI 32.89 kg/m??     Right shoulder full range of motion, tenderness over the right trapezius    Imaging: Prior right shoulder x-ray with mild AC arthritis    I personally reviewed images if needed/appropriate and agree with interpretation.     Assessment:   Diagnosis ICD-10-CM Associated Orders   1. Trigger point of right shoulder region  M25.511           Plan:  I discussed work up and management/treatment options with patient.  See orders and AVS for specifics regarding home exercise program, OTC and prescription medications, supplements, labs, imaging, and referrals.    Repeat right trapezius trigger point injections, because she has had 3 sets of these in the past 10 days I recommended deferring any more anytime soon, but we could repeat in 1 to 2 months if desired.    Trigger Point Injection Procedure Note:    Verbal consent.  Risks, benefits, and alternatives discussed.  Sterile prep.  Location (s): R trap  # trigger points injected: 4  Needle: 25 G 1.5 inch  Meds: Total of 2 cc 1% lidocaine + 2 ml 0.9% NaCl used, with 1 cc distributed at each trigger point  Sterile bandage(s) applied.  Tolerated well without complication.      Return if symptoms worsen or fail to improve.    Corbin Ade, MD, CAQSM  Primary Care Sports Medicine  [facility]

## 2023-01-09 NOTE — Unmapped (Addendum)
Thank you for coming to [facility] clinic today.    We aim to provide you with the highest quality care.  If you have any unanswered questions after the visit or need to schedule a follow up visit, please do not hesitate to call us:    [facility] [place]:  [phone]    [facility] [place]:  [phone]    You can also use MyChart to reach out to [place].    We look forward to seeing you again in the future and appreciate you coming to [facility].    Thank you.    Corbin Ade, MD    Neck: Exercises  Introduction  Here are some examples of exercises for you to try. The exercises may be suggested for a condition or for rehabilitation. Start each exercise slowly. Ease off the exercises if you start to have pain.  You will be told when to start these exercises and which ones will work best for you.  How to do the exercises      Neck stretch   This stretch works best if you keep your shoulder down as you lean away from it. To help you remember to do this, start by relaxing your shoulders and lightly holding on to your thighs or your chair.  Tilt your head toward your shoulder and hold for 15 to 30 seconds. Let the weight of your head stretch your muscles.  If you would like a little added stretch, use your hand to gently and steadily pull your head toward your shoulder. For example, keeping your right shoulder down, lean your head to the left.  Repeat 2 to 4 times toward each shoulder.    Diagonal neck stretch   Turn your head slightly toward the direction you will be stretching, and tilt your head diagonally toward your chest and hold for 15 to 30 seconds.  If you would like a little added stretch, use your hand to gently and steadily pull your head forward on the diagonal.  Repeat 2 to 4 times toward each side.    Dorsal glide stretch   Sit or stand tall and look straight ahead.  Slowly tuck your chin as you glide your head backward over your body  Hold for a count of 6, and then relax for up to 10 seconds.  Repeat 8 to 12 times.    Chest and shoulder stretch   Sit or stand tall and glide your head backward as in the dorsal glide stretch.  Raise both arms so that your hands are next to your ears.  Take a deep breath, and as you breathe out, lower your elbows down and behind your back. You will feel your shoulder blades slide down and together, and at the same time you will feel a stretch across your chest and the front of your shoulders.  Hold for about 6 seconds, and then relax for up to 10 seconds.  Repeat 8 to 12 times.    Strengthening: Hands on head   Move your head backward, forward, and side to side against gentle pressure from your hands, holding each position for about 6 seconds.  Repeat 8 to 12 times.    Follow-up care is a key part of your treatment and safety. Be sure to make and go to all appointments, and call your doctor if you are having problems. It's also a good idea to know your test results and keep a list of  the medicines you take.  Where can you learn more?  Go to [facility] at https://myuncchart.org  [facility] under [employer]. Enter P975 in the search box to learn more about Neck: Exercises.  Current as of: March 11, 2018  Content Version: 12.3  ?? 2006-2019 [employer]. Care instructions adapted under license by [facility]. If you have questions about a medical condition or this instruction, always ask your healthcare professional. Health[employer], Inc[employer] disclaims any warranty or liability for your use of this information.

## 2023-01-09 NOTE — Unmapped (Signed)
[facility] Family [facility] Clinical Pharmacist Note    Spoke with pt about CGM and confirmed she picked up [place] 3 CGM supplies earlier today. Expresses interest in setting up clinic visit to review CGM application and use. Scheduled for visit next week on Wed 5/1 at 12pm.    Also requests refill on BG testing supplies for back-up, needs test strips and lancets (says she has True Metrix meter). Sent refill Rx's for strips and lancets to [facility] pharmacy per pt preference.    [name], PharmD, CPP, [facility]  Family Medicine Clinical Pharmacist

## 2023-01-10 MED ORDER — OXYCODONE 5 MG TABLET
ORAL_TABLET | Freq: Two times a day (BID) | ORAL | 0 refills | 30 days | Status: CP | PRN
Start: 2023-01-10 — End: 2023-02-09
  Filled 2023-01-09: qty 60, 30d supply, fill #0

## 2023-01-10 NOTE — Unmapped (Signed)
Upcoming Appt:  Future Appointments   Date Time Provider Department Center   01/13/2023  8:45 AM [place], [place] [facility]   01/15/2023 12:00 PM Melba Coon, CPP [facility]   01/21/2023  4:20 PM Janene Madeira, MD [facility]   02/20/2023  1:20 PM Janene Madeira, MD [facility]   02/20/2023  3:30 PM Paul Half, PA [facility]   03/03/2023  9:30 AM Mellody Life, Nilda Simmer, AGNP [facility]   03/17/2023  1:15 PM Barron Alvine, PT [facility]   04/21/2023 10:00 AM Worthy Flank, CPP [facility]   07/18/2023  9:30 AM Zelasky, Trixie Deis, PA [facility]       Recent:   What is the date of your last related visit?    01/09/23 Trigger point right fever  Related acute medications Rx'd:  Amantandine HCL, Dimathyl Fumarate   Home treatment tried:  monitoring BG       Relevant:   Allergies: Statins-hmg-coa reductase inhibitors  Medications: Amantandine HCL, Dimathyl Fumarate   Health History: HTN, COPD, MS, T2DM, MDD, bipolar, PTSD  Weight: n/a      [place] [facility] patients only:  What was the date of your last cancer treatment (mm/dd/yy)?: n/a  Was the treatment oral or infusion?: n/a  Are you currently on TVEC (yes/no)?: n/a      Reason for Disposition   [1] Evening (after bedtime snack) blood glucose < 100 mg/dL (5.6 mmol/L) AND [1] more than once in past week    Answer Assessment - Initial Assessment Questions  1. SYMPTOMS: What symptoms are you concerned about?      Patient states her BG keeps dropping.  Patient states she took her evening insulin but has been having trouble remembering things.  She does not know if she ate a meal when she took her fast acting insulin.    2. ONSET:  When did the symptoms start?     01/09/23 @ 6:45pm   3. BLOOD GLUCOSE: What is your blood glucose level?   BG 51mg /dl 10 minutes ago  Rechecked at 7:08pm:  129mg /dl   Rechecked at 6:10RU 117mg /dl  4. USUAL RANGE: What is your blood glucose level usually? (e.g., usual fasting morning value, usual evening value)     Patient started checking on 4/21  Fasting 80-90s  Evening low 100s  5. TYPE 1 or 2:  Do you know what type of diabetes you have?  (e.g., Type 1, Type 2, Gestational; doesn't know)       Type 2DM  6. INSULIN: Do you take insulin? What type of insulin(s) do you use? What is the mode of delivery? (syringe, pen; injection or pump) When did you last give yourself an insulin dose? (i.e., time or hours/minutes ago) How much did you give? (i.e., how many units)    Trulicity(Last dose 01/03/2023)  Lantus (Last dose 3-4 hours ago)- grey needle is 56 units   Aspart (Last dose 3-4 hours ago)- patient states she does now know if she ate when she took medication blue needle is 10 units twice a day  7. DIABETES PILLS: Do you take any pills for your diabetes? If Yes, ask: What is the name of the medicine(s) that you take for high blood sugar?      Denies  8. OTHER SYMPTOMS: Do you have any symptoms? (e.g., fever, frequent urination, difficulty breathing, vomiting)   Patient reports feeling light headed,  and frequent urination, Patient reports having difficulty remembering things.  States this has been ongoing for a few months.  Has not been addressed with PCP  Patient states that ever since she started taking Amantandine HCL & Dimathyl Fumarate, her BP have been dropping   Denies fever, difficulty breathing, vomiting    9. LOW BLOOD GLUCOSE TREATMENT: What have you done so far to treat the low blood glucose level?      Drank sweet tea, patient ate candy   10. FOOD: When did you last eat or drink?        Has had turnip greens and a cup of sweet tea    11. ALONE: Are you alone right now or is someone with you?         Patient has family in the home with her   61. PREGNANCY: Is there any chance you are pregnant? When was your last menstrual period?        N/a    Protocols used: Diabetes - Low Blood Sugar-A-AH

## 2023-01-13 ENCOUNTER — Ambulatory Visit
Admit: 2023-01-13 | Discharge: 2023-01-14 | Payer: PRIVATE HEALTH INSURANCE | Attending: Rehabilitative and Restorative Service Providers" | Primary: Rehabilitative and Restorative Service Providers"

## 2023-01-13 MED ORDER — PREDNISONE 10 MG TABLET
ORAL_TABLET | 0 refills | 0 days | Status: CP
Start: 2023-01-13 — End: ?
  Filled 2023-01-13: qty 42, 12d supply, fill #0

## 2023-01-13 MED ORDER — VUMERITY 231 MG CAPSULE,DELAYED RELEASE
ORAL_CAPSULE | Freq: Two times a day (BID) | ORAL | 4 refills | 0 days | Status: CP
Start: 2023-01-13 — End: ?

## 2023-01-13 NOTE — Unmapped (Signed)
SPORTS MEDICINE NEW VISIT    ASSESSMENT AND PLAN       ICD-10-CM   1. Cervical radiculopathy  M54.12       PLAN  Requested Prescriptions     Signed Prescriptions Disp Refills    predniSONE (DELTASONE) 10 MG tablet 42 tablet 0     Sig: Take 6 tablets (60 mg) by mouth daily for 2 days, then take 5 tablets (50 mg) daily for 2 days, then take 4 tablets (40 mg) daily for 2 days, then take 3 tablets (30 mg) daily for 2 days, then take 2 tablets (20 mg) daily for 2 days, then take 1 tablet (10 mg) daily for 2 days     - Home exercise program provided  - Prescribed  prednisone 12-day taper for swelling and pain  - Ice affected area for 15-20 minutes every hour as needed  - Heat affected area for 20-30 minutes as needed  - Patient's shoulder symptoms appear to be related to the patient's underlying neck pathology. She is encouraged to follow-up with her team at the spine center to discuss possible ESI treatments to see if this relieves her shoulder symptoms.    Return if symptoms worsen or fail to improve.    Procedure(s):      Test Results  Imaging  Recent images of the affected area were independently interpreted by me and reveal no bony abnormalities or deformities of the right shoulder.    SUBJECTIVE     Chief Complaint:   Chief Complaint   Patient presents with    Right Shoulder - Pain     Right Shoulder Pain   Onset: 2 months   NKI   Constant Ache Pain  Radiating pain          Hand Dominance: Right    History of Present Illness: 57 y.o. female who presents for evaluation of Right shoulder pain. Inciting event: none, date: 2 months ago, the patient denies any injury or trauma. She developed sudden onset right shoulder pain that is so severe it causes episodes of emesis. She has underlying cervical spondylosis with moderate canal narrowing at C4-5 and neural foraminal narrowing between C4-C6. She is being worked up for possible multiple sclerosis due to demyelinating plaque also found on C-spine MRI, but she has not received any treatment for the other cervical spine findings. She was being followed by Corbin Ade, MD for her shoulder symptoms and has received an ultrasound guided injection to the a.c. joint and repeat trigger point injections without any relief in her pain.   Pain is located trapezius. Discomfort is described as aching, burning, numbness, sharp/stabbing, throbbing, and tingling.   Aggravators: sitting still, lying down  Treatments tried: OTC NSAIDs (ibuprofen, Aleve, topical votaren gel) which are/were ineffective and corticosteroid injection(s) which are/were ineffective    Past Medical History:   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 as calculated from the following:    Height as of 01/01/23: 170.2 cm (5' 7).    Weight as of 01/09/23: 95.3 kg (210 lb).  Gen: Well-appearing female in no acute distress    MUSCULOSKELETAL       RIGHT shoulder LEFT shoulder   Inspection: No swelling, erythema, or deformity  Palpation: Tender: No tenderness to palpation  Range of motion:  normal   Special Tests: Negative Neer's test, Negative Hawkin's test, 5/5 supraspinatus strength  without pain, 5/5 infraspinatus strength, Negative Speed's test Inspection: No swelling, erythema, or deformity  Palpation: Tender: No tenderness to palpation  Range of motion:  normal   Special Tests: None performed. Patient is neurovascularly intact distally.       MEDICAL DECISION MAKING (level of service defined by 2/3 elements)     Number/Complexity of Problems Addressed 1 acute complicated injury (99204/99214)   Amount/Complexity of Data to be Reviewed/Analyzed Independent interpretation of a test performed by another physician/other qualified health care professional (99204/99214)   Risk of Complications/Morbidity/Mortality of Management Prescription Medication (99204/99214)         ADMINISTRATIVE     I have personally reviewed and interpreted the images (as available).  I have personally reviewed prior records and incorporated relevant information above (as available).      DME     DME ORDER:  Dx:  ,

## 2023-01-13 NOTE — Unmapped (Incomplete)
Medications for quitting smoking:  Varenacline (Chantix) 1 tablet (1 mg) twice daily  Nicotine patch and gum    COPD medications:  Anoro Ellipta 1 puff once daily  Albuterol 1-2 puffs every 4-6 hours as needed for shortness of breath or wheezing  We will look into getting you a new nebulizer    Blood pressure medications:  Lisinopril 1 tablet (20 mg) daily  Amlodipine 1 tablet (10 mg) daily    Diabetes medications:  Trulicity 4.5 mg injection once weekly on Sundays  Lantus *** units once daily  Decrease Novolog to 10 units before meals (2-3 times per day). Give 10-15 minutes before eating.    How often and when to check blood sugar:  Check 3-4 times each day:  before breakfast  before lunch  before supper  at bedtime    Pick up the new Freestyle Libre 3 CGM supplies (reader and sensors) and bring to next visit.    How to treat low blood sugar:  For blood sugar less than 70 --> Treat with 4 ounces of juice or regular soda, or with 3 to 4 glucose tablets. Re-check blood sugar in 15 minutes.    If blood sugar is still less than 70 on re-check, treat again and re-check in 15 minutes.   If blood sugar is over 70 on re-check and it is time to eat your regular meal, eat regular meal and take insulin as prescribed for that blood sugar.    When to follow-up:  *** with Dr. Selena Batten  *** with me      [name], PharmD, CPP, [facility]  Family Medicine Clinical Pharmacist  Clinic Phone: [phone]

## 2023-01-13 NOTE — Unmapped (Unsigned)
Subjective     Reason for visit:    Tonya Wood is a 57 y.o. female with a history of diabetes (type 2), COPD, HTN, schizoaffective disorder, and tobacco use who presents today for a diabetes pharmacotherapy visit.  Patient presents to this visit alone.    Known DM Complications: no known complications    Date of Last Diabetes Related Visit: 11/20/22 with CPP, 12/10/22 with PCP    Action At Last Diabetes Related Visit:    Increase Trulicity from 3 to 4.5 mg SubQ weekly (09/25/22)  Continue Lantus 56 units daily  Decrease Novolog from 20 to 14 units BID AC - continue to emphasize importance of giving 10-15 minutes before eating  Continue Anoro Ellipta 1 puff daily  Continue Chantix 1 mg BID  Continue nicotine 21 mg/24h patch + 4 mg gum PRN  Continue ezetimibe 10 mg daily (statin previously stopped due to c/f contributing to CK elevation)  Encouraged pt to bring medications to future visits    Since Last visit / History of Present Illness:    Patient reports fully implementing plan from last visit. Confirms taking increased Trulicity without issues and decreased Novolog as instructed. Feels BG readings have been consistently well controlled so no concerns or complaints related to DM. Denies s/sx hyper- or hypoglycemia.    Mentions nebulizer broke/stopped working but has had for a long time so thinks she just needs a new one, otherwise using inhalers consistently. Increased frequency of SOB episodes due to spring allergy season, pollen and many other triggers [place]. Prior to nebulizer breaking, was using 1-3 times per day. Prior to allergy season, felt breathing was relatively well controlled.    Still smoking ~1-2 cigarettes per day. Not yet ready to quit. Using nicotine patch and Chantix regularly.    Reported DM Regimen: ***  Novolog 14 units BID AC (blue pen) - typically 2 times per day, still eating ~15-20 minutes after giving insulin  Lantus 56 units daily (gray pen)  Trulicity 4.5 mg weekly (Sundays)    DM medications tried in the past:   Metformin - pt doesn't like it so is not willing to take    Medication Adherence and Access: ***  Since last visit, patient denies missing doses of medications recently, although has had intermittent non-adherence in the recent past with periods of severe pain.    Pt requests new prescription for Depakote ER 1000 mg daily, which was previously prescribed by psychiatrist at [facility] but no longer follows with.    Pt previously had [place] PAP but now has active Medicaid.    SMBG Per patient memory: ***  Pt feels BG is decreased from last visit. Checking ~1 time per week at varying times of the day, sometimes AM and sometimes PM.    For past ~2 weeks:  Avg: 110  Highest: 110  Lowest: low 100s    Hypoglycemia:    Symptoms of hypoglycemia since last visit: no  If yes, it was treated by: n/a    DM-Related Prevention:  Statin: Not taking (stopped statin due to concern for causing elevated CK);  previously on atorvastatin 40 mg (high intensity) , now on ezetimibe 10 mg daily  Aspirin: unclear if indicated (pt reports history of CVA that occurred prior to seeking care at [facility] so not in recent health records); Taking    ACEI/ARB: Taking (lisinopril 20 mg); Urine MA/CR Ratio - normal (last checked 12/28/21).  Last eye  exam: 04/24/20 - DUE  Last foot exam: 12/28/21   Tobacco Use: Current smoker  Immunizations:   Immunization History   Administered Date(s) Administered    COVID-19 VAC,BIVALENT(44YR UP),PFIZER 08/08/2021    COVID-19 VAC,MRNA,TRIS(12Y UP)(PFIZER)(GRAY CAP) 01/28/2020, 02/18/2020    COVID-19 VACC,MRNA,(PFIZER)(PF) 01/28/2020, 02/18/2020, 08/21/2020    Covid-19 Vac, (25yr+) (Spikevax) Monovalent Xbb.1.5 Moder  08/01/2022    Covid-19 Vacc, Unspecified 01/28/2020, 02/18/2020, 08/21/2020    Hep A / Hep B 09/27/2003, 10/28/2003, 10/17/2006    INFLUENZA TIV (TRI) 26MO+ W/ PRESERV (IM) 02/25/2008, 07/11/2010    INFLUENZA TIV (TRI) PF (IM) 07/01/2013    Influenza Vaccine Quad(IM)6 MO-Adult(PF) 06/09/2015, 01/08/2017, 07/22/2018, 06/30/2020, 07/17/2021, 05/21/2022    Influenza Virus Vaccine, unspecified formulation 06/30/2020    PPD Test 07/06/2014, 07/15/2014, 05/22/2016    Pneumococcal Conjugate 20-valent 12/28/2021    TdaP 12/19/2011, 05/05/2019     _________________________________________________    Past Medical History: reviewed PMH in epic today    Social History:  Social History     Tobacco Use   Smoking Status Every Day    Current packs/day: 2.00    Average packs/day: 2.0 packs/day for 37.2 years (74.5 ttl pk-yrs)    Types: Cigarettes    Start date: 10/17/1985   Smokeless Tobacco Never       Medications: Medications reviewed in EPIC medication station and updated today by the clinical pharmacist practitioner.     Objective   Review of Systems: ***  Constitutional:  No fever, chills or unintentional weight loss  Cardiovascular:  No chest pain or pressure, shortness of breath, orthopnea or LE edema  Pulmonary:  No cough or SOB, (+) dyspnea on exertion  GI:  No constipation, diarrhea, dyspepsia, change in bowel habits, nausea, abdominal pain  Endocrine: No polyuria, polyphagia, blurred vision    Physical Examination:  Vitals:    There were no vitals filed for this visit. ***        Wt Readings from Last 3 Encounters:   01/09/23 95.3 kg (210 lb)   01/04/23 95.3 kg (210 lb)   01/01/23 95.3 kg (210 lb)       There is no height or weight on file to calculate BMI.    The 10-year ASCVD risk score (Arnett DK, et al., 2019) is: 15.3%    Values used to calculate the score:      Age: 53 years      Sex: Female      Is Non-Hispanic African American: Yes      Diabetic: Yes      Tobacco smoker: Yes      Systolic Blood Pressure: 112 mmHg      Is BP treated: Yes      HDL Cholesterol: 41 mg/dL      Total Cholesterol: 152 mg/dL    Note: For patients with SBP <90 or >200, Total Cholesterol <130 or >320, HDL <20 or >100 which are outside of the allowable range, the calculator will use these upper or lower values to calculate the patient???s risk score.    mMRC:  Most recent MMRC dyspnea scale: 3 Last MMRC date: 11/20/2022    Labs:   Lab Results   Component Value Date    A1C 5.7 (H) 01/01/2023    A1C 6.3 (H) 09/25/2022     Lab Results   Component Value Date    CHOL 152 09/25/2022    TRIG 205 (H) 09/25/2022    HDL 41 09/25/2022    LDL 70 09/25/2022  Assessment/Plan:    1. Diabetes, type 2: controlled per last A1c of 5.7% (01/01/23), which is further decreased from 6.3% in Jan 2024. Goal <7% per ADA guidelines. *** Per reported SMBG, both FBG and PPBG values often at goal, although suspect we are not capturing much of her day-to-day BG trends since she is only checking 1 time per week. Now has CGM and reviewed application and use today so pt can begin using. Regardless, based on notable improvement in A1c over the course of ~6 months with Trulicity initiation/titration and now on max dose and the few PPBG values pt can report are well below goal <180, feel it is reasonable to further decrease prandial insulin *** to avoid hypoglycemia in the event that pt may have hypoglycemia unawareness and particularly since she is still inappropriately giving Novolog after meals. Also discussed pursuing CGM to help better assess day-to-day glucose trends and guide further tapering of insulin, which pt was open to and did not have specific preference for Dexcom vs Josephine Igo but ultimately decided to go with [employer].  Continue Trulicity 4.5 mg SubQ once weekly  Continue Lantus 56 units daily  Decrease Novolog from 14 to 10 units BID AC - continue to emphasize importance of giving 10-15 minutes before eating  Repeat A1c due 12/25/22  Reviewed symptoms and treatment of hypoglycemia, including need to check BG prior to and following treatment.  SMBG instructions: BID-TID  Sent Rx for Freestyle Libre 3 CGM (reader + sensors) - anticipate it will require a PA with Medicaid, scheduled visit in ~2 weeks to review CGM application and use and anticipate CGM will be approved by then  Future considerations:  Taper prandial insulin as indicated  SGLT2 inhibitor - may help to taper insulin further in future    2. 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 However, mMRC assessment shows slight improvement in dyspnea from score of 4 in 04/2022 to 3 at last visit. Now has new nebulizer for PRN use during this season. Continues on recommended maintenance therapy (LABA/LAMA) per the 2024 GOLD guidelines. *** Will continue to reassess inhaler technique and pt ability to utilize DPI inhaler appropriately with future visits as this is the main intervention for improving dyspnea, although could consider escalation to triple therapy if recurrent exacerbations in the future. Also continuing to work on smoking cessation as below.  Continue Anoro Ellipta 62.5-25mcg 1 puff Daily  Continue albuterol HFA inhaler (w/ spacer) q4-6h PRN  Continue ipratropium-albuterol nebulized solution q4-6h PRN  Sent order for new nebulizer device to [employer] supplier    3. Smoking cessation: pt has *** reduced # of cigarettes per day per her report but continuing to work towards complete cessation, using NRT patch + gum which are helping as well as Chantix. Will hold off on stepping down nicotine patch dose since pt still smoking occasionally, but will reconsider once she is ready to quit smoking cigarettes altogether.  Continue Chantix 1 mg BID  Continue nicotine 21 mg/24h patch daily + 4 mg gum PRN  Consider stepping down patch to 14 mg/24h dose as able in the future    4. ASCVD Prevention: moderate to high risk based on 10-year ASCVD risk score. No longer on high intensity statin due to some concern it was contributing to elevated CK levels, but on ezetimibe since June 2023. Last lipid panel with LDL 70 (09/25/22) and improved from 123 in Oct 2023.  Goal LDL <70 per 2022 ACC ECDP given DM + ASCVD risk >7.5%. Since LDL essentially at goal, will not pursue any additional non-statin LDL lowering therapies at this time.  Continue ezetimibe 10 mg daily    5. Hypertension: controlled based on clinic BP of *** 116/78. Goal <130/80 per ADA guidelines. No further med changes warranted at this time.  Continue amlodipine 10 mg daily  Continue lisinopril 20 mg daily  Future considerations:  Titrate lisinopril to max dose of 40 mg as indicated  May consider addition of thiazide diuretic if additional BP lowering needed on max doses of CCB + ACEi    Follow-up: Return to [facility] in *** 2 weeks with CPP and on 01/21/23 with PCP as scheduled    Future Appointments   Date Time Provider Department Center   01/15/2023 12:00 PM Melba Coon, CPP [facility]   01/21/2023  4:20 PM Janene Madeira, MD [facility]   02/20/2023  1:20 PM Janene Madeira, MD [facility]   02/20/2023  3:30 PM Paul Half, PA [facility]   03/03/2023  9:30 AM Mellody Life, Nilda Simmer, AGNP [facility]   03/17/2023  1:15 PM Barron Alvine, PT [facility]   04/21/2023 10:00 AM Worthy Flank, CPP [facility]   07/18/2023  9:30 AM Zelasky, Trixie Deis, PA [facility]       I spent a total of *** 35 minutes face to face with the patient delivering clinical care and providing education/counseling.    _________________________________________________    [name], PharmD, CPP, [facility]  Family Medicine Clinical Pharmacist

## 2023-01-13 NOTE — Unmapped (Signed)
PA was submitted via covermymeds for Oxycodone, currently pending with the insurance company.  jwan yell (Key: [id number])

## 2023-01-13 NOTE — Unmapped (Signed)
Plan:  - Alternate ice and heat, 20 minutes each, ending with ice  - Perform Home Exercises as described below  - Refer to the spine center for consideration of injections to the cervical spine  - Take prednisone 12-day taper for swelling and pain  - If you have any questions or concerns, contact our sports team coverage line at [phone]    -Take prednisone dose in AM when you first wake up. Taking it later in the day may result in difficulty sleeping at night.  -Take entire dose for the day at one time, DO NOT split up the dose throughout the day.  - Avoid all other anti-inflammatories while on this medication. You may resume anti-inflammatories once the prednisone prescription is complete.    12-day taper: 60 mg x 2 days, then 50 mg x 2 days, then 40 mg x 2 days, then 30 mg x 2 days, then 20 mg x 2 days, then 10 mg x 2 days      Levator scapula stretch    Sit in a firm chair, or stand up straight.  Gently tilt your head toward your left shoulder.  Turn your head to look down into your armpit, bending your head slightly forward. Let the weight of your head stretch your neck muscles.  Hold for 15 to 30 seconds.  Return to your starting position.  Follow the same instructions above, but tilt your head toward your right shoulder.  Repeat 2 to 4 times toward each shoulder.    Upper trapezius stretch    Sit in a firm chair, or stand up straight.  This stretch works best if you keep your shoulder down as you lean away from it. To help you remember to do this, start by relaxing your shoulders and lightly holding on to your thighs or your chair.  Tilt your head toward your shoulder and hold for 15 to 30 seconds. Let the weight of your head stretch your muscles.  If you would like a little added stretch, place your arm behind your back. Use the arm opposite of the direction you are tilting your head. For example, if you are tilting your head to the left, place your right arm behind your back.  Repeat 2 to 4 times toward each shoulder.    Neck rotation    Sit in a firm chair, or stand up straight.  Keeping your chin level, turn your head to the right, and hold for 15 to 30 seconds.  Turn your head to the left, and hold for 15 to 30 seconds.  Repeat 2 to 4 times to each side.    Chin tuck    Lie on the floor with a rolled-up towel under your neck. Your head should be touching the floor.  Slowly bring your chin toward the front of your neck.  Hold for a count of 6, and then relax for up to 10 seconds.  Repeat 8 to 12 times.    Forward neck flexion    Sit in a firm chair, or stand up straight.  Bend your head forward.  Hold for 15 to 30 seconds, then return to your starting position.  Repeat 2 to 4 times.    Lower neck and upper back stretch    Stretch your arms out in front of your body. Clasp one hand on top of your other hand.  Gently reach out so that you feel your shoulder blades stretching away from each other.  Gently bend your  head forward.  Hold for 15 to 30 seconds.  Repeat 2 to 4 times.    Midback stretch    If you have knee pain, do not do this exercise.  Kneel on the floor, and sit back on your ankles.  Lean forward, place your hands on the floor, and stretch your arms out in front of you. Rest your head between your arms.  Gently push your chest toward the floor, reaching as far in front of you as possible.  Hold for 15 to 30 seconds.  Repeat 2 to 4 times.    Shoulder rolls    Sit comfortably with your feet shoulder-width apart. You can also do this exercise while standing.  Roll your shoulders up, then back, and then down in a smooth, circular motion.  Repeat 2 to 4 times.        Thank you for coming to [facility] and our clinic today!     We aim to provide you with the highest quality, individualized care.  If you have any unanswered questions after the visit, please do not hesitate to reach out to [place] on MyChart or leave a message for the nurse.  ?  MyChart messages: These messages can be sent to your provider and will be checked by their clinical support staff.? The messages are checked throughout the day during normal business hours from 8:30 am-4:00 pm Monday-Friday, however responses may take up to 48 hours.? Please use this method of communication for non-urgent and non-emergent concerns, questions, refill requests or inquiries only.? ?Our team will help respond to all of your questions.? Please note that you may be asked to see a provider by either a telehealth or in person visit if it is deemed your questions are best handled in the clinic setting in person.??  ?  Please keep in mind, these messages are not real time communications, so be patient when waiting for a response.    If you do not have access to MyChart, do not know how to use MyChart or have an issue that may require more extensive discussion, please call the nurses' call line: [phone].? This line is checked throughout the day and will be responded to as time allows.? Please note that return calls could take up to 48 hours, depending on the nature of the need.?  ?  If you have an issue that requires emergent attention that cannot wait; either call the Orthopaedics resident on call at [phone], consider coming to our [facility] walk-in clinic, or go to the nearest Emergency Department.    If you need to schedule future appointments, please call [phone].     We look forward to seeing you again in the future and appreciate you choosing [facility] for your care!    Thank you,                We provide innovative and comprehensive patient centered care that is supported by evidence-based research  RESEARCH PARTICIPATION    Please check out our current research studies to see if you or someone you know may qualify at:    [email or web address]

## 2023-01-15 MED FILL — CETIRIZINE 10 MG TABLET: ORAL | 30 days supply | Qty: 30 | Fill #3

## 2023-01-15 MED FILL — COLCHICINE 0.6 MG TABLET: ORAL | 30 days supply | Qty: 30 | Fill #3

## 2023-01-15 MED FILL — NALOXONE 4 MG/ACTUATION NASAL SPRAY: 1 days supply | Qty: 2 | Fill #4

## 2023-01-15 MED FILL — NICOTINE (POLACRILEX) 4 MG GUM: ORAL | 5 days supply | Qty: 110 | Fill #1

## 2023-01-15 MED FILL — NICOTINE 21 MG/24 HR DAILY TRANSDERMAL PATCH: TRANSDERMAL | 28 days supply | Qty: 28 | Fill #2

## 2023-01-15 MED FILL — DICLOFENAC 1 % TOPICAL GEL: TOPICAL | 25 days supply | Qty: 200 | Fill #4

## 2023-01-15 MED FILL — AMANTADINE HCL 100 MG CAPSULE: ORAL | 30 days supply | Qty: 60 | Fill #0

## 2023-01-15 MED FILL — ACCU-CHEK GUIDE TEST STRIPS: 17 days supply | Qty: 50 | Fill #0

## 2023-01-15 MED FILL — IPRATROPIUM 0.5 MG-ALBUTEROL 3 MG (2.5 MG BASE)/3 ML NEBULIZATION SOLN: RESPIRATORY_TRACT | 15 days supply | Qty: 180 | Fill #2

## 2023-01-15 NOTE — Unmapped (Incomplete)
Medications for quitting smoking:  Varenacline (Chantix) 1 tablet (1 mg) twice daily  Nicotine patch and gum    COPD medications:  Anoro Ellipta 1 puff once daily  Albuterol 1-2 puffs every 4-6 hours as needed for shortness of breath or wheezing  We will look into getting you a new nebulizer    Blood pressure medications:  Lisinopril 1 tablet (20 mg) daily  Amlodipine 1 tablet (10 mg) daily    Diabetes medications:  Trulicity 4.5 mg injection once weekly on Sundays  Lantus *** units once daily  Decrease Novolog to 10 units before meals (2-3 times per day). Give 10-15 minutes before eating.    How often and when to check blood sugar:  Check 3-4 times each day:  before breakfast  before lunch  before supper  at bedtime    Pick up the new Freestyle Libre 3 CGM supplies (reader and sensors) and bring to next visit.    How to treat low blood sugar:  For blood sugar less than 70 --> Treat with 4 ounces of juice or regular soda, or with 3 to 4 glucose tablets. Re-check blood sugar in 15 minutes.    If blood sugar is still less than 70 on re-check, treat again and re-check in 15 minutes.   If blood sugar is over 70 on re-check and it is time to eat your regular meal, eat regular meal and take insulin as prescribed for that blood sugar.    When to follow-up:  Tuesday, May 7th at 4:20pm with Dr. Selena Batten  *** with me      [name], PharmD, CPP, [facility]  Family Medicine Clinical Pharmacist  Clinic Phone: [phone]

## 2023-01-15 NOTE — Unmapped (Unsigned)
Subjective     Reason for visit:    Tonya Wood is a 57 y.o. female with a history of diabetes (type 2), COPD, HTN, schizoaffective disorder, and tobacco use who presents today for a diabetes pharmacotherapy visit.  Patient presents to this visit alone.    Known DM Complications: no known complications    Date of Last Diabetes Related Visit: 11/20/22 with CPP, 01/01/23 with PCP    Action At Last Diabetes Related Visit:    Continue Trulicity 4.5 mg SubQ weekly  Continue Lantus 56 units daily  Decrease Novolog from 14 to 10 units BID AC - continue to emphasize importance of giving 10-15 minutes before eating  Sent Rx's for Freestyle Libre 3 CGM Reader + Sensors - plan to review CGM application and use at future visit once PA approved  Continue Anoro Ellipta 1 puff daily  Continue Chantix 1 mg BID  Continue nicotine 21 mg/24h patch + 4 mg gum PRN  Continue ezetimibe 10 mg daily (statin previously stopped due to c/f contributing to CK elevation)  Encouraged pt to bring medications to future visits    Since Last visit / History of Present Illness:    Patient reports fully implementing plan from last visit. ***    Questions/notes for today:  - Started on new MS drug Tecfidera ~4/11 (date shipped from [facility])  - PCP working to taper off oxycodone for pain management  - Prescribed 12-day prednisone taper on 4/29 for swelling/pain in back (saw ortho)    - SMBG?  - S/sx high or low BG?  - Review Libre 3 CGM application and use?  - Taper Novolog and/or Lantus as appropriate (last A1c down to 5.7%)?  - Confirm eating meal 10-15 minutes after Novolog?  - Consider adding SGLT2i to further taper insulin?    - Use of new nebulizer?  - SOB/dyspnea improved/stable?  - Smoking cessation?    OLD:  Confirms taking increased Trulicity without issues and decreased Novolog as instructed. Feels BG readings have been consistently well controlled so no concerns or complaints related to DM. Denies s/sx hyper- or hypoglycemia.    Mentions nebulizer broke/stopped working but has had for a long time so thinks she just needs a new one, otherwise using inhalers consistently. Increased frequency of SOB episodes due to spring allergy season, pollen and many other triggers [place]. Prior to nebulizer breaking, was using 1-3 times per day. Prior to allergy season, felt breathing was relatively well controlled.    Still smoking ~1-2 cigarettes per day. Not yet ready to quit. Using nicotine patch and Chantix regularly.    Reported DM Regimen: ***  Novolog 14 units BID AC (blue pen) - typically 2 times per day, still eating ~15-20 minutes after giving insulin  Lantus 56 units daily (gray pen)  Trulicity 4.5 mg weekly (Sundays)    DM medications tried in the past:   Metformin - pt doesn't like it so is not willing to take    Medication Adherence and Access: ***  Since last visit, patient denies missing doses of medications recently, although has had intermittent non-adherence in the recent past with periods of severe pain.    Pt requests new prescription for Depakote ER 1000 mg daily, which was previously prescribed by psychiatrist at [facility] but no longer follows with.    Pt previously had [place] PAP but now has active Medicaid.    SMBG Per patient memory: ***  Pt  feels BG is decreased from last visit. Checking ~1 time per week at varying times of the day, sometimes AM and sometimes PM.    For past ~2 weeks:  Avg: 110  Highest: 110  Lowest: low 100s    Hypoglycemia:    Symptoms of hypoglycemia since last visit: no  If yes, it was treated by: n/a    DM-Related Prevention:  Statin: Not taking (stopped statin due to concern for causing elevated CK);  previously on atorvastatin 40 mg (high intensity) , now on ezetimibe 10 mg daily  Aspirin: unclear if indicated (pt reports history of CVA that occurred prior to seeking care at [facility] so not in recent health records); Taking    ACEI/ARB: Taking (lisinopril 20 mg); Urine MA/CR Ratio - normal (last checked 12/28/21).  Last eye exam: 04/24/20 - DUE  Last foot exam: 12/28/21   Tobacco Use: Current smoker  Immunizations:   Immunization History   Administered Date(s) Administered    COVID-19 VAC,BIVALENT(20YR UP),PFIZER 08/08/2021    COVID-19 VAC,MRNA,TRIS(12Y UP)(PFIZER)(GRAY CAP) 01/28/2020, 02/18/2020    COVID-19 VACC,MRNA,(PFIZER)(PF) 01/28/2020, 02/18/2020, 08/21/2020    Covid-19 Vac, (9yr+) (Spikevax) Monovalent Xbb.1.5 Moder  08/01/2022    Covid-19 Vacc, Unspecified 01/28/2020, 02/18/2020, 08/21/2020    Hep A / Hep B 09/27/2003, 10/28/2003, 10/17/2006    INFLUENZA TIV (TRI) 24MO+ W/ PRESERV (IM) 02/25/2008, 07/11/2010    INFLUENZA TIV (TRI) PF (IM) 07/01/2013    Influenza Vaccine Quad(IM)6 MO-Adult(PF) 06/09/2015, 01/08/2017, 07/22/2018, 06/30/2020, 07/17/2021, 05/21/2022    Influenza Virus Vaccine, unspecified formulation 06/30/2020    PPD Test 07/06/2014, 07/15/2014, 05/22/2016    Pneumococcal Conjugate 20-valent 12/28/2021    TdaP 12/19/2011, 05/05/2019     _________________________________________________    Past Medical History: reviewed PMH in epic today    Social History:  Social History     Tobacco Use   Smoking Status Every Day    Current packs/day: 2.00    Average packs/day: 2.0 packs/day for 37.2 years (74.5 ttl pk-yrs)    Types: Cigarettes    Start date: 10/17/1985   Smokeless Tobacco Never       Medications: Medications reviewed in EPIC medication station and updated today by the clinical pharmacist practitioner.     Objective   Review of Systems: ***  Constitutional:  No fever, chills or unintentional weight loss  Cardiovascular:  No chest pain or pressure, shortness of breath, orthopnea or LE edema  Pulmonary:  No cough or SOB, (+) dyspnea on exertion  GI:  No constipation, diarrhea, dyspepsia, change in bowel habits, nausea, abdominal pain  Endocrine: No polyuria, polyphagia, blurred vision    Physical Examination:  Vitals:    There were no vitals filed for this visit. ***        Wt Readings from Last 3 Encounters:   01/09/23 95.3 kg (210 lb)   01/04/23 95.3 kg (210 lb)   01/01/23 95.3 kg (210 lb)       There is no height or weight on file to calculate BMI.    The 10-year ASCVD risk score (Arnett DK, et al., 2019) is: 15.3%    Values used to calculate the score:      Age: 7 years      Sex: Female      Is Non-Hispanic African American: Yes      Diabetic: Yes      Tobacco smoker: Yes      Systolic Blood Pressure: 112 mmHg      Is BP treated: Yes  HDL Cholesterol: 41 mg/dL      Total Cholesterol: 152 mg/dL    Note: For patients with SBP <90 or >200, Total Cholesterol <130 or >320, HDL <20 or >100 which are outside of the allowable range, the calculator will use these upper or lower values to calculate the patient???s risk score.    mMRC:  Most recent MMRC dyspnea scale: 3 Last MMRC date: 11/20/2022    Labs:   Lab Results   Component Value Date    A1C 5.7 (H) 01/01/2023    A1C 6.3 (H) 09/25/2022     Lab Results   Component Value Date    CHOL 152 09/25/2022    TRIG 205 (H) 09/25/2022    HDL 41 09/25/2022    LDL 70 09/25/2022           Assessment/Plan:    1. Diabetes, type 2: controlled per last A1c of 5.7% (01/01/23), which is further decreased from 6.3% in Jan 2024. Goal <7% per ADA guidelines. *** Per reported SMBG, both FBG and PPBG values often at goal, although suspect we are not capturing much of her day-to-day BG trends since she is only checking 1 time per week. Now has CGM and reviewed application and use today so pt can begin using. Regardless, based on notable improvement in A1c over the course of ~6 months with Trulicity initiation/titration and now on max dose and the few PPBG values pt can report are well below goal <180, feel it is reasonable to further decrease prandial insulin *** to avoid hypoglycemia in the event that pt may have hypoglycemia unawareness and particularly since she is still inappropriately giving Novolog after meals. Also discussed pursuing CGM to help better assess day-to-day glucose trends and guide further tapering of insulin, which pt was open to and did not have specific preference for Dexcom vs Josephine Igo but ultimately decided to go with [employer].  Continue Trulicity 4.5 mg SubQ once weekly  Continue Lantus 56 units daily  Decrease Novolog from 14 to 10 units BID AC - continue to emphasize importance of giving 10-15 minutes before eating  Repeat A1c due 12/25/22  Reviewed symptoms and treatment of hypoglycemia, including need to check BG prior to and following treatment.  SMBG instructions: BID-TID  Sent Rx for Freestyle Libre 3 CGM (reader + sensors) - anticipate it will require a PA with Medicaid, scheduled visit in ~2 weeks to review CGM application and use and anticipate CGM will be approved by then  Future considerations:  Taper prandial insulin as indicated  SGLT2 inhibitor - may help to taper insulin further in future    2. 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 However, mMRC assessment shows slight improvement in dyspnea from score of 4 in 04/2022 to 3 at last visit. Now has new nebulizer for PRN use during this season. Continues on recommended maintenance therapy (LABA/LAMA) per the 2024 GOLD guidelines. *** Will continue to reassess inhaler technique and pt ability to utilize DPI inhaler appropriately with future visits as this is the main intervention for improving dyspnea, although could consider escalation to triple therapy if recurrent exacerbations in the future. Also continuing to work on smoking cessation as below.  Continue Anoro Ellipta 62.5-25mcg 1 puff Daily  Continue albuterol HFA inhaler (w/ spacer) q4-6h PRN  Continue ipratropium-albuterol nebulized solution q4-6h PRN  Sent order for new nebulizer device to [employer] supplier    3. Smoking  cessation: pt has *** reduced # of cigarettes per day per her report but continuing to work towards complete cessation, using NRT patch + gum which are helping as well as Chantix. Will hold off on stepping down nicotine patch dose since pt still smoking occasionally, but will reconsider once she is ready to quit smoking cigarettes altogether.  Continue Chantix 1 mg BID  Continue nicotine 21 mg/24h patch daily + 4 mg gum PRN  Consider stepping down patch to 14 mg/24h dose as able in the future    4. ASCVD Prevention: moderate to high risk based on 10-year ASCVD risk score. No longer on high intensity statin due to some concern it was contributing to elevated CK levels, but on ezetimibe since June 2023. Last lipid panel with LDL 70 (09/25/22) and improved from 123 in Oct 2023. Goal LDL <70 per 2022 ACC ECDP given DM + ASCVD risk >7.5%. Since LDL essentially at goal, will not pursue any additional non-statin LDL lowering therapies at this time.  Continue ezetimibe 10 mg daily    5. Hypertension: controlled based on clinic BP of *** 116/78. Goal <130/80 per ADA guidelines. No further med changes warranted at this time.  Continue amlodipine 10 mg daily  Continue lisinopril 20 mg daily  Future considerations:  Titrate lisinopril to max dose of 40 mg as indicated  May consider addition of thiazide diuretic if additional BP lowering needed on max doses of CCB + ACEi    Follow-up: Return to [facility] in *** 2 weeks with CPP and on 01/21/23 with PCP as scheduled    Future Appointments   Date Time Provider Department Center   01/16/2023  9:00 AM Melba Coon, CPP [facility]   01/21/2023  4:20 PM Janene Madeira, MD [facility]   02/20/2023  1:20 PM Janene Madeira, MD [facility]   02/20/2023  3:30 PM Paul Half, PA [facility]   03/03/2023  9:30 AM Mellody Life, Nilda Simmer, AGNP [facility]   03/17/2023  1:15 PM Barron Alvine, PT [facility]   04/21/2023 10:00 AM Worthy Flank, CPP [facility]   07/18/2023  9:30 AM Zelasky, Trixie Deis, PA [facility]       I spent a total of *** 35 minutes face to face with the patient delivering clinical care and providing education/counseling.    _________________________________________________    [name], PharmD, CPP, [facility]  Family Medicine Clinical Pharmacist

## 2023-01-16 ENCOUNTER — Institutional Professional Consult (permissible substitution)
Admit: 2023-01-16 | Discharge: 2023-01-17 | Payer: PRIVATE HEALTH INSURANCE | Attending: Ambulatory Care | Primary: Ambulatory Care

## 2023-01-16 MED FILL — ACETAMINOPHEN 500 MG TABLET: ORAL | 34 days supply | Qty: 200 | Fill #1

## 2023-01-21 ENCOUNTER — Ambulatory Visit
Admit: 2023-01-21 | Discharge: 2023-01-22 | Payer: PRIVATE HEALTH INSURANCE | Attending: Student in an Organized Health Care Education/Training Program | Primary: Student in an Organized Health Care Education/Training Program

## 2023-01-21 MED FILL — FREESTYLE LIBRE 3 READER: 30 days supply | Qty: 1 | Fill #0

## 2023-01-21 NOTE — Unmapped (Signed)
[facility]    Problem List Items Addressed This Visit          Nervous and Auditory    Multiple sclerosis (CMS-HCC)    Relevant Orders    Home Medical Equipment     Other Visit Diagnoses       Cervical pain    -  Primary    Chronic pain syndrome        Relevant Medications    oxyCODONE (ROXICODONE) 5 MG immediate release tablet (Start on 02/08/2023)    Other Relevant Orders    Toxicology Screen, Urine    Opiates confirmation, random, urine    Depression, unspecified depression type        Relevant Orders    Ambulatory referral to Social Work            ASSESSMENT/PLAN:       Follow up: UDS and confirmatory screen in June    #Pain  - Refill for oxy provided. PDMP reviewed. Will need UDS and confirmatory screen in June, or earlier if possible    #Diabetes  - Pharmacist available to help educate pt on freestyle libre use    #Depression  Initiating visit for Behavioral Health/Collaborative Care Management    Assessed behavioral health needs and concerns.   Based on this assessment this patient has a diagnosed psychiatric disorder or behavioral health condition and would benefit from Behavioral Health/ Collaborative Care Management. This will include: behavioral care assessment, behavioral health care planning and provision of brief interventions.  Provided education on the Behavioral Health/Collaborative Care Management Program  Obtained verbal consent for enrollment in  Behavioral Health/Collaborative Care Management. (including consent to consult with relevant specialist and cost sharing for both in-person and non-face-to-face services provided)  Enrolled patient in Behavioral Health/Collaborative Care Management.     I will provide oversight and management of this service, including reviewing treatment progress and modifying the care plan as needed.      SUBJECTIVE:  HPI: Tonya Wood is a 57 y.o. female who presents to clinic today for the following issues:  - April 30th nephew got murdered (trying to help sister with grief - sister also lost oldest son several years ago): shot in [place]  - taking care of herself    - sugars fasting (100's)    - had higher sugars but was on prednisone taper for shoulder pain (pain improved after the steroid taper)    - goes see disability lady on Saturday  - behavioral health management    - [email or web address]  - [phone]    OBJECTIVE:  BP 128/87 (BP Site: L Arm, BP Position: Sitting, BP Cuff Size: Large)  - Pulse 96  - Temp 36.2 ??C (97.1 ??F) (Temporal)  - Ht 170.2 cm (5' 7)  - Wt 95.9 kg (211 lb 6.4 oz)  - BMI 33.11 kg/m??       Physical Exam  Constitutional: Alert and oriented to person, place, and time. Well appearing and in no acute distress.  Eyes: Conjunctivae are normal. PERRL, EOMI.   ENT       Head: Normocephalic and atraumatic.       Nose: No congestion.       Mouth/Throat: Mucous membranes are moist.       Neck: No stridor, neck is supple.  Hematological/Lymphatic/Immunilogical: No cervical lymphadenopathy.  Cardiovascular: Normal rate, regular rhythm. Normal and symmetric distal pulses are present in all extremities.   Respiratory: Normal respiratory effort. Breath sounds are  normal bilaterally with no wheezes, rales, or rhonchi.  Musculoskeletal: Nontender with normal range of motion in all extremities.       Right lower leg: No tenderness or edema.       Left lower leg: No tenderness or edema.  Neurologic: Normal speech and language. No gross focal neurologic deficits are appreciated.    Skin: Skin is warm, dry and intact. No rash noted.  Psychiatric: Mood and affect are normal. Speech and behavior are normal.    Tonya Madeira, MD   [facility]        [facility]  [address] [place], [place] [address]    Telephone [phone]  Fax [phone]  [email or web address]

## 2023-01-21 NOTE — Unmapped (Signed)
Patient Advice Request     Patient Name: Tonya Wood  Caller: Self (Patient)  Contact Method: Telephone Call: Time- Any Time [phone]   Reason for Call: patient called in and would like for pcp to send medication to   [facility]   Phone: [phone]   Fax: [phone]         Instead of the [place] due to [place] location not administering narcotics   Previously Discussed: no  Appointment Offered: No

## 2023-01-22 MED FILL — LISINOPRIL 20 MG TABLET: ORAL | 90 days supply | Qty: 90 | Fill #1

## 2023-01-22 MED FILL — VENTOLIN HFA 90 MCG/ACTUATION AEROSOL INHALER: RESPIRATORY_TRACT | 25 days supply | Qty: 18 | Fill #1

## 2023-01-22 NOTE — Unmapped (Signed)
I was the supervising physician in clinic and I was available during Dr. Elmyra Ricks clinic session.  I agree with the assessment and plan as documented in her note.  Patriciaann Clan, DO

## 2023-01-22 NOTE — Unmapped (Signed)
Addended by: Janene Madeira on: 01/22/2023 02:12 PM     Modules accepted: Orders

## 2023-01-22 NOTE — Unmapped (Signed)
Addended by: Janene Madeira on: 01/22/2023 02:03 PM     Modules accepted: Orders

## 2023-01-22 NOTE — Unmapped (Signed)
Spoke with pt over the phone, informed pt that the prescription was sent to [facility].

## 2023-01-22 NOTE — Unmapped (Signed)
Care Management Progress Note  [facility]             Purpose of contact:         SW followed up on provider request to assist patient with DME, and food and housing resources.    Additional Information/Plan:    [place]: Patient requesting a Retail banker - SW messaged PCP the updated request. SW asked patient to call SW if she has not heard from [employer] in a week.    Housing Resources: SW gave patient the number for [employer] Helpline to talk with them about any programs she may qualify for.    Food Resources: SW mailed a list of resources to patient as requested.     Patient provided my direct contact information and encouraged to contact me should additional needs arise.    Webster Patrone, LCSW, CCM  Population Health  [facility]  Ph: [phone]

## 2023-01-23 NOTE — Unmapped (Signed)
Pre-assessment Screener/Consent to Treatment    How BHCM works  Overview of [employer]: It is an Astronomer to assist patients in improving their overall emotional well-being. We find that patients with your symptoms get better faster when we work together as a team, which includes you, your Primary Care Provider, myself, and a Psychiatric Consultant. We assist with developing a care plan, provide brief mental health counseling, and assist in coordinating the patient's care team or connecting to outside resources. Are you interested in hearing more about how that works?Yes.    Important to note, this is not trauma focused or trauma processing therapy - if you are seeking a more trauma-focused approach, I am happy to connect you to another community resource. Do you have any questions about that? No    These brief counseling sessions occur generally every other week, but you can discuss with your [facility] what works best for you. Our BHCMs are LCSWs/As and Psychology PhD interns. The [facility] Manager will work with you for 3-6 months and monitor your improvement by measuring your symptoms using standardized rating scales. Depending on the how you are feeling, after 6 months they will discuss with you graduation from this program or ongoing counseling through connection to outside community resources. This also depends on what you would like, and can be a discussion with the [facility] about your goals and progress.      The charge for this service ranges from $100-$330 per month (self pay rate of $60-$200 per month) depending on the level of service provided. I understand that a member of my behavioral health team can review my anticipated out of pocket cost before my enrollment or anytime thereafter. I understand I will be charged and expected to pay the usual deductibles and coinsurance for these care management services for behavioral health concerns on a monthly basis (provided there is a least 20 minutes of care management services for behavioral health concerns provided in the month).     Before we go further and I ask more about you, do you have any questions about this program? Does this still sound like a good fit for you? Yes.    Do you have a history of previous mental health treatment? Yes   Are you currently seeing another counselor/therapist? No    Patient goals  What do you hope to get from this program? Stress management, grief counseling  What would you want your [facility] to know about you? Recent loss, unstable housing  What kinds of things do you hope your [facility] will do to support you? Help deal with all my emotions and would like to connect with long term therapy  On a scale from 1-10, 1 being not important, 10 being very important, how important is it for you to make a change? 10  Why is that number not lower? A lot of emotions going on right now  Is Medication Management one of your goals?  No    Preferences for Therapist that are non-negotiable   Gender / race / ethnicity- woman   Video / in-person / phone- phone   Day of week / time of day- no preferences     Patient was made aware of no show policy, and sent a copy for them to review via AVS or via MyChart  No. Reason: Patient does not have MyChart, will mail to patient once enrolled.    Patient was sent consent for them to review via AVS or via  MyChart, and verbal consent is documented in note by PCP   Yes.

## 2023-01-24 NOTE — Unmapped (Signed)
Behavioral Health/Collaborative Care Management Program               Date of Service:  01/24/2023       Date of Last Encounter:  Visit date not found    Purpose of contact:     SW contacted the patient as part of BHCCM to assess interest in program/schedule intake. Patient was verbally upset on the phone and was currently on the side of the road because her car broke doen     Patient had no imminent plan to harm themself or someone else     Will continue to follow-up with patient as patient will benefit from brief therapy and ongoing consultation with treatment team.    Follow Up Plan/Next Steps:  []  Provide referral/linkage information:  []  Intake scheduled:  [x]  Other:      Additional Information/Plan:  Pt was provided with my direct office phone number in the voice message should additional needs arise.    Nolon Stalls, MSW, LCSWA  Care Manager   [facility] at [facility]   (p) [phone] within an hour and after 2 attempts, call was not going through and saying not available.     -01/27/23- SW contacted patient again to follow up and patient was in better spirit. It was her birthday and though she did not have any plans, she expressed that she was ok. Patient also reported that the female that she wanted to leave was still at the home, but is helping more. She reported that her brother stepped in and gave instructions and ultimatums to the female living there if he does not help.       Will continue to follow-up with patient as patient will benefit from brief therapy and ongoing consultation with treatment team.    Follow Up Plan/Next Steps:  []  Provide referral/linkage information:  [x]  Intake scheduled: Monday 02/03/23 @ 11:00 am  []  Other:        Additional Information/Plan:  Pt was provided with my direct office phone number in the voice message should additional needs arise.    Nolon Stalls, MSW, [facility]  Care Manager   [facility] at [facility]   [phone]

## 2023-01-27 DIAGNOSIS — M19041 Primary osteoarthritis, right hand: Principal | ICD-10-CM

## 2023-01-27 DIAGNOSIS — G8929 Other chronic pain: Principal | ICD-10-CM

## 2023-01-27 DIAGNOSIS — E1165 Type 2 diabetes mellitus with hyperglycemia: Principal | ICD-10-CM

## 2023-01-27 DIAGNOSIS — M545 Chronic bilateral low back pain without sciatica: Principal | ICD-10-CM

## 2023-01-27 DIAGNOSIS — M19042 Primary osteoarthritis, left hand: Principal | ICD-10-CM

## 2023-01-27 DIAGNOSIS — M2550 Pain in unspecified joint: Principal | ICD-10-CM

## 2023-01-27 MED ORDER — DULOXETINE 60 MG CAPSULE,DELAYED RELEASE
ORAL_CAPSULE | Freq: Every day | ORAL | 3 refills | 90 days | Status: CP
Start: 2023-01-27 — End: 2024-01-22
  Filled 2023-01-28: qty 90, 90d supply, fill #0

## 2023-01-27 MED ORDER — FREESTYLE LIBRE 3 READER
Freq: Once | 0 refills | 30 days | Status: CP
Start: 2023-01-27 — End: 2023-01-27

## 2023-01-30 NOTE — Unmapped (Signed)
Medication Request     Patient Name: Tonya Wood   Caller: Self (Patient)  Have you contacted your pharmacy? yes      Last Visit: 01/21/2023       Medication Name: dulaglutide (TRULICITY) 4.5 mg/0.5 mL PnIj [[id number]] (pt says she can not find a pharmacy that is in stock with rx)  Dosage:  Route: Injection (IM/SUBQ)  Frequency: As Needed (PRN)  Day Supply Requested: 53  Pharmacy (Name & Address): Any Pharmacy that has rx in stock   Pharmacy Phone Number:

## 2023-01-31 DIAGNOSIS — E1159 Type 2 diabetes mellitus with other circulatory complications: Principal | ICD-10-CM

## 2023-01-31 DIAGNOSIS — Z794 Long term (current) use of insulin: Principal | ICD-10-CM

## 2023-01-31 NOTE — Unmapped (Signed)
Contacted patient regarding patient call to clinic- unable to find a pharmacy with Trulicity 4.5 mg in stock.     Left HIPAA complaint voicemail with call back information.     Considerations: Trulicity 1.5 mg dose, or initiate SGLT2 per Edwina Barth last clinic note. Starting SGLT2 would require follow up with Tobi Bastos in 2-4 weeks    Karalee Height, PharmD CPP  [facility] Clinical Pharmacist

## 2023-02-03 ENCOUNTER — Ambulatory Visit: Admit: 2023-02-03 | Payer: PRIVATE HEALTH INSURANCE

## 2023-02-03 MED FILL — FREESTYLE LIBRE 3 SENSOR DEVICE: 28 days supply | Qty: 2 | Fill #1

## 2023-02-03 NOTE — Unmapped (Signed)
Behavioral Health/Collaborative Care Management Program               Date of Service:  02/03/2023       Date of Last Encounter:  02/03/2023    Purpose of contact:     SW contacted the patient as part of [facility] for scheduled intake appointment today and to assess interest in program. Will continue to follow-up with patient as patient will benefit from brief therapy and ongoing consultation with treatment team.    Follow Up Plan/Next Steps:  []  Provide referral/linkage information:  []  Intake scheduled:  [x]  Other: SW left a message to reschedule intake       Additional Information/Plan:  Pt was provided with my direct office phone number in the voice message should additional needs arise.    Nolon Stalls, MSW, [facility]  Care Manager   [facility] at [facility]   [phone]

## 2023-02-04 DIAGNOSIS — M545 Chronic bilateral low back pain without sciatica: Principal | ICD-10-CM

## 2023-02-04 DIAGNOSIS — J449 Chronic obstructive pulmonary disease, unspecified: Principal | ICD-10-CM

## 2023-02-04 DIAGNOSIS — G8929 Other chronic pain: Principal | ICD-10-CM

## 2023-02-04 DIAGNOSIS — M2550 Pain in unspecified joint: Principal | ICD-10-CM

## 2023-02-04 MED ORDER — DICLOFENAC 1 % TOPICAL GEL
Freq: Four times a day (QID) | TOPICAL | 6 refills | 25 days
Start: 2023-02-04 — End: 2024-02-04

## 2023-02-04 MED ORDER — CHLORHEXIDINE GLUCONATE 0.12 % MOUTHWASH
Freq: Two times a day (BID) | OROMUCOSAL | 0 refills | 79 days | Status: CP
Start: 2023-02-04 — End: 2023-05-05
  Filled 2023-02-11: qty 473, 16d supply, fill #0

## 2023-02-04 MED ORDER — IPRATROPIUM 0.5 MG-ALBUTEROL 3 MG (2.5 MG BASE)/3 ML NEBULIZATION SOLN
Freq: Four times a day (QID) | RESPIRATORY_TRACT | 3 refills | 15 days | PRN
Start: 2023-02-04 — End: 2024-02-04

## 2023-02-04 MED ORDER — NICOTINE 21 MG/24 HR DAILY TRANSDERMAL PATCH
MEDICATED_PATCH | TRANSDERMAL | 2 refills | 28 days
Start: 2023-02-04 — End: ?

## 2023-02-04 NOTE — Unmapped (Signed)
[facility] Clinical Assessment & Refill Coordination Note    Tonya Wood, DOB: [birth year]  Phone: [phone] (home)     All above HIPAA information was verified with patient.     Was a Nurse, learning disability used for this call? No    Specialty Medication(s):   Neurology: Dimethyl fumarate      Current Outpatient Medications   Medication Sig Dispense Refill    acetaminophen (TYLENOL EXTRA STRENGTH) 500 MG tablet Take 2 tablets (1,000 mg total) by mouth Three (3) times a day. 200 tablet 11    amantadine HCL (SYMMETREL) 100 mg capsule Take 1 capsule (100 mg total) by mouth two (2) times a day. 60 capsule 5    amlodipine (NORVASC) 10 MG tablet Take 1 tablet (10 mg total) by mouth daily. 90 tablet 3    aspirin (ECOTRIN) 81 MG tablet Take 1 tablet (81 mg total) by mouth daily.      blood sugar diagnostic (GLUCOSE BLOOD) Strp Test blood glucose 3 times daily. 50 strip 11    blood-glucose meter,continuous (FREESTYLE LIBRE 3 READER) Misc Use as directed to test blood sugar 1 each 0    blood-glucose sensor (FREESTYLE LIBRE 3 SENSOR) Devi Change 1 sensor every fourteen (14) days. 6 each 3    carboxymethylcellulose sodium (REFRESH TEARS) 0.5 % Drop Administer 2 drops to both eyes four (4) times a day as needed. 30 mL 3    cetirizine (ZYRTEC) 10 MG tablet Take 1 tablet (10 mg total) by mouth daily. 90 tablet 3    cholecalciferol, vitamin D3-1,250 mcg, 50,000 unit,, 1,250 mcg (50,000 unit) capsule Take 1 capsule (1,250 mcg total) by mouth once a week. 24 capsule 0    colchicine (COLCRYS) 0.6 mg tablet Take 1 tablet (0.6 mg total) by mouth daily. 90 tablet 3    diclofenac sodium (VOLTAREN) 1 % gel Apply 2 g topically four (4) times a day. 200 g 6    dimethyl fumarate 120 mg CpDR Take 1 capsule (120 mg) by mouth two (2) times a day for 7 days. 14 capsule 0    dimethyl fumarate 240 mg CpDR Take 1 capsule (240 mg total) by mouth two (2) times a day. 60 capsule 2    divalproex ER (DEPAKOTE ER) 500 MG extended released 24 hr tablet Take 2 tablets (1,000 mg total) by mouth daily.      dulaglutide (TRULICITY) 4.5 mg/0.5 mL PnIj Inject 4.5 mg under the skin every seven (7) days. 6 mL 3    DULoxetine (CYMBALTA) 60 MG capsule Take 1 capsule (60 mg total) by mouth daily. 90 capsule 3    ezetimibe (ZETIA) 10 mg tablet Take 1 tablet (10 mg total) by mouth daily. 90 tablet 3    fluticasone propionate (FLONASE) 50 mcg/actuation nasal spray Use 1 spray into each nostril daily. 16 g 11    ibuprofen (MOTRIN) 600 MG tablet TAKE 1 TABLET BY MOUTH EVERY 6 HOURS AS NEEDED FOR PAIN FOR UP TO 10 DAYS      inhaler, assist devices (OPTICHAMBER, AEROCHAMBER, ADULT) Spcr Use as directed with inhalers 1 each 0    insulin aspart (NOVOLOG FLEXPEN) 100 unit/mL (3 mL) injection pen Inject 0.1 mL (10 Units total) under the skin two (2) times a day. Give 10-15 minutes before a meal. 15 mL 4    insulin glargine (BASAGLAR, LANTUS) 100 unit/mL (3 mL) injection pen Inject 0.56 mL (56 Units total) under the skin nightly. 15 mL 11  ipratropium-albuterol (DUO-NEB) 0.5-2.5 mg/3 mL nebulizer Inhale 3 mL (contents of one nebule) by nebulization every six (6) hours as needed. 180 mL 3    lancets Misc Test blood glucose 3 times daily. 100 each 11    levothyroxine (SYNTHROID) 100 MCG tablet Take 1 tablet (100 mcg total) by mouth.      lidocaine-prilocaine (EMLA) 2.5-2.5 % cream Apply topically Three (3) times a day as needed. 30 g 0    lisinopril (PRINIVIL,ZESTRIL) 20 MG tablet Take 1 tablet (20 mg total) by mouth daily. 90 tablet 3    loratadine (CLARITIN) 10 mg tablet Take 1 tablet (10 mg total) by mouth daily.      magnesium oxide (MAG-OX) 400 mg (241.3 mg elemental magnesium) tablet Take 1 tablet (400 mg total) by mouth daily. 30 tablet 11    naloxone (NARCAN) 4 mg nasal spray Give single spray in one nostril.  Repeat with 2nd device in other nostril every 3 min if no or minimal response until 911 arrives 2 each 11    nicotine (NICODERM CQ) 21 mg/24 hr patch Place 1 patch on the skin daily. 28 patch 2    nicotine polacrilex (NICORETTE) 4 MG gum Take 1 each (4 mg total) by mouth every hour as needed. 110 each 2    ondansetron (ZOFRAN) 4 MG tablet Take 1 tablet (4 mg total) by mouth daily as needed for nausea. 30 tablet 1    [START ON 02/08/2023] oxyCODONE (ROXICODONE) 5 MG immediate release tablet Take 1 tablet (5 mg total) by mouth two (2) times a day as needed for pain. DNF 02/07/23 60 tablet 0    polyethylene glycol (CLEARLAX) 17 gram/dose powder Take as directed for extended bowel prep. 238 g 0    predniSONE (DELTASONE) 10 MG tablet Take 6 tablets (60 mg) by mouth daily for 2 days, then take 5 tablets (50 mg) daily for 2 days, then take 4 tablets (40 mg) daily for 2 days, then take 3 tablets (30 mg) daily for 2 days, then take 2 tablets (20 mg) daily for 2 days, then take 1 tablet (10 mg) daily for 2 days 42 tablet 0    pregabalin (LYRICA) 75 MG capsule Take 1 capsule (75 mg total) by mouth Three (3) times a day. 90 capsule 2    QUEtiapine (SEROQUEL) 200 MG tablet TAKE 1 Tablet BY MOUTH ONCE EVERY EVENING      TECHLITE PEN NEEDLE 32 gauge x 1/4 (6 mm) Ndle       umeclidinium-vilanteroL (ANORO ELLIPTA) 62.5-25 mcg/actuation inhaler Inhale 1 puff daily. 180 each 3    varenicline (CHANTIX) 1 mg tablet       VENTOLIN HFA 90 mcg/actuation inhaler Inhale 2 puffs every six (6) hours as needed for wheezing. 18 g 5     No current facility-administered medications for this visit.        Changes to medications: Tonya Wood reports no changes at this time.    Allergies   Allergen Reactions    Grass Pollen-[place], Standard        Changes to allergies: No    SPECIALTY MEDICATION ADHERENCE     Dimethyl fumarate 240 mg: 5 days of medicine on hand     Medication Adherence    Patient reported X missed doses in the last month: 0  Specialty Medication: dimethyl fumarate 240mg  BID  Patient is on additional specialty medications: No  Patient is on more than two specialty medications: No  Any gaps in refill  history greater than 2 weeks in the last 3 months: no  Demonstrates understanding of importance of adherence: yes  Informant: patient          Specialty medication(s) dose(s) confirmed: Regimen is correct and unchanged.     Are there any concerns with adherence? No    Adherence counseling provided? Not needed    CLINICAL MANAGEMENT AND INTERVENTION      Clinical Benefit Assessment:    Do you feel the medicine is effective or helping your condition? Yes    Clinical Benefit counseling provided? Not needed    Adverse Effects Assessment:    Are you experiencing any side effects? No    Are you experiencing difficulty administering your medicine? No    Quality of Life Assessment:    Quality of Life    Rheumatology  Oncology  Dermatology  Cystic Fibrosis          How many days over the past month did your MS  keep you from your normal activities? For example, brushing your teeth or getting up in the morning. Patient declined to answer    Have you discussed this with your provider? Not needed    Acute Infection Status:    Acute infections noted within Epic:  No active infections  Patient reported infection: None    Therapy Appropriateness:    Is therapy appropriate and patient progressing towards therapeutic goals? Yes, therapy is appropriate and should be continued    DISEASE/MEDICATION-SPECIFIC INFORMATION      N/A    Multiple Sclerosis: Have you experienced any flares in the last month? No  Has this been reported to your provider? Not applicable  What was the outcome of the flare? Not applicable    PATIENT SPECIFIC NEEDS     Does the patient have any physical, cognitive, or cultural barriers? No    Is the patient high risk? No    Did the patient require a clinical intervention? No    Does the patient require physician intervention or other additional services (i.e., nutrition, smoking cessation, social work)? No    SOCIAL DETERMINANTS OF HEALTH     At the [facility], we have learned that life circumstances - like trouble affording food, housing, utilities, or transportation can affect the health of many of our patients.   That is why we wanted to ask: are you currently experiencing any life circumstances that are negatively impacting your health and/or quality of life? Patient declined to answer    Social Determinants of Health     Financial Resource Strain: Not on file   Internet Connectivity: Not on file   Food Insecurity: Not on file   Tobacco Use: High Risk (01/21/2023)    Patient History     Smoking Tobacco Use: Every Day     Smokeless Tobacco Use: Never     Passive Exposure: Not on file   Housing/Utilities: Not on file   Alcohol Use: Not At Risk (07/17/2021)    Received from [facility]    AUDIT-C     Frequency of Alcohol Consumption: Never     Average Number of Drinks: Not on file     Frequency of Binge Drinking: Never   Transportation Needs: Not on file   Substance Use: Not on file   Health Literacy: Not on file   Physical Activity: Not on file   Interpersonal Safety: Not on file   Stress: Not on file   Intimate Partner Violence: Not on  file   Depression: None or minimal depression (07/27/2021)    Received from [facility]    PHQ-9     (OBSOLETE) PHQ-9 Total Score=: 0   Social Connections: Not on file       Would you be willing to receive help with any of the needs that you have identified today? Not applicable       SHIPPING     Specialty Medication(s) to be Shipped:   Neurology: Dimethyl fumarate     Other medication(s) to be shipped:  amantadine 100mg , amlodipine 10mg , cetirizine 10mg , chlorhexidine 0.12%, colchicine 0.6mg , voltaren gel 1%, ezetimibe 10mg , duo-neb, lantus, magnesium 400mg , nicotine 21mg  patch, nicotine 4mg  gum, novolog, pregabalin 75mg . And ventolin inhaler     Changes to insurance: No    Delivery Scheduled: Yes, Expected medication delivery date: 02/07/23.     Medication will be delivered via Same Day Courier to the confirmed prescription address in [facility].    The patient will receive a drug information handout for each medication shipped and additional FDA Medication Guides as required.  Verified that patient has previously received a Conservation officer, historic buildings and a Surveyor, mining.    The patient or caregiver noted above participated in the development of this care plan and knows that they can request review of or adjustments to the care plan at any time.      All of the patient's questions and concerns have been addressed.    Oliva Bustard, PharmD   [facility] Specialty Pharmacist

## 2023-02-05 DIAGNOSIS — E1165 Type 2 diabetes mellitus with hyperglycemia: Principal | ICD-10-CM

## 2023-02-05 DIAGNOSIS — E1159 Type 2 diabetes mellitus with other circulatory complications: Principal | ICD-10-CM

## 2023-02-05 DIAGNOSIS — Z794 Long term (current) use of insulin: Principal | ICD-10-CM

## 2023-02-05 MED ORDER — DICLOFENAC 1 % TOPICAL GEL
Freq: Four times a day (QID) | TOPICAL | 6 refills | 25 days | Status: CP
Start: 2023-02-05 — End: 2024-02-05
  Filled 2023-02-11: qty 200, 25d supply, fill #0

## 2023-02-05 MED ORDER — IPRATROPIUM 0.5 MG-ALBUTEROL 3 MG (2.5 MG BASE)/3 ML NEBULIZATION SOLN
Freq: Four times a day (QID) | RESPIRATORY_TRACT | 3 refills | 15 days | Status: CP | PRN
Start: 2023-02-05 — End: 2024-02-05
  Filled 2023-02-11: qty 180, 15d supply, fill #0

## 2023-02-05 MED ORDER — NICOTINE 21 MG/24 HR DAILY TRANSDERMAL PATCH
MEDICATED_PATCH | TRANSDERMAL | 2 refills | 28 days | Status: CP
Start: 2023-02-05 — End: ?
  Filled 2023-02-11: qty 28, 28d supply, fill #0

## 2023-02-05 MED ORDER — EMPAGLIFLOZIN 25 MG TABLET
ORAL_TABLET | Freq: Every day | ORAL | 3 refills | 90 days | Status: CP
Start: 2023-02-05 — End: 2024-02-05
  Filled 2023-02-06: qty 90, 90d supply, fill #0

## 2023-02-05 MED ORDER — QUETIAPINE 200 MG TABLET
ORAL_TABLET | Freq: Every evening | ORAL | 3 refills | 90 days | Status: CP
Start: 2023-02-05 — End: 2024-02-05
  Filled 2023-02-11: qty 90, 90d supply, fill #0

## 2023-02-05 MED ORDER — TRULICITY 1.5 MG/0.5 ML SUBCUTANEOUS PEN INJECTOR
SUBCUTANEOUS | 3 refills | 84 days | Status: CP
Start: 2023-02-05 — End: 2024-02-05
  Filled 2023-02-06: qty 2, 28d supply, fill #0

## 2023-02-05 MED ORDER — ASPIRIN 81 MG TABLET,DELAYED RELEASE
ORAL_TABLET | Freq: Every day | ORAL | 3 refills | 90 days | Status: CP
Start: 2023-02-05 — End: 2024-02-05
  Filled 2023-02-11: qty 90, 90d supply, fill #0

## 2023-02-05 NOTE — Unmapped (Signed)
Patient is calling in again regarding her insulin, I let patient know that the last message was stating that an alternative to Trulicity is trying to be found. Patient is taking old insulin that she had, that is expired due to not receiving any response or phone call from, Dr. Selena Batten, CPP, [place], and SW [place]. Patient requested to speak with Nurse, for advice on how to take her old insulin as she is just taking it with no correct instruction, I reached out to Nurse Lenord Fellers and she states that she will make Dr. Selena Batten made aware personally that patient is requesting insulin medication. Patient was never transferred to nurse per nurse.

## 2023-02-05 NOTE — Unmapped (Signed)
Pt called again to say that no one has contacted her regarding her insulin and is wanting an update as soon as possible.  She also wanted to speak to [facility] as soon as possible

## 2023-02-06 NOTE — Unmapped (Signed)
[facility]  Care Management Progress Note               Purpose of contact: Returning patient's call     Attempted to reach patient via telephone; left voicemail with contact information.    Provider/Care Partner(s) to follow up on: N/A    Shea Stakes, CCM  [facility]  [facility]  [phone]

## 2023-02-06 NOTE — Unmapped (Signed)
Attempted to contact pt to confirm that pt understands the plan. No response. Left voicemail with call back number.

## 2023-02-06 NOTE — Unmapped (Signed)
Patient currently on lantus 56 in the morning (takes it in the morning because she has cramps) (100's) and restarted meal time insulin 06/25/09 because sugars 300-400 after she ran out of trulicity 2 weeks ago. Was supposed to have decreased lantus 46 and stop insulin.    Sent jardiance 25 and trulicity 1.5mg  (only dose in stock at [facility]) to pharmacy and instructed pt to stop her meal time insulin and decrease long acting to 46 units. Reviewed her low A1C and my concern for low sugars. Also recommended pt to avoid high sugar snacks as I suspect this is causing her high blood sugars.

## 2023-02-06 NOTE — Unmapped (Signed)
A user error has taken place: encounter opened in error, closed for administrative reasons.

## 2023-02-08 MED ORDER — OXYCODONE 5 MG TABLET
ORAL_TABLET | Freq: Two times a day (BID) | ORAL | 0 refills | 30.00000 days | Status: CP | PRN
Start: 2023-02-08 — End: 2023-01-21
  Filled 2023-03-10: qty 60, 30d supply, fill #0

## 2023-02-10 MED FILL — VENTOLIN HFA 90 MCG/ACTUATION AEROSOL INHALER: RESPIRATORY_TRACT | 25 days supply | Qty: 18 | Fill #2

## 2023-02-11 MED FILL — DIMETHYL FUMARATE 240 MG CAPSULE,DELAYED RELEASE: ORAL | 30 days supply | Qty: 60 | Fill #1

## 2023-02-11 MED FILL — LANTUS SOLOSTAR U-100 INSULIN 100 UNIT/ML (3 ML) SUBCUTANEOUS PEN: SUBCUTANEOUS | 26 days supply | Qty: 15 | Fill #2

## 2023-02-11 MED FILL — MAGNESIUM OXIDE 400 MG (241.3 MG MAGNESIUM) TABLET: ORAL | 60 days supply | Qty: 60 | Fill #6

## 2023-02-11 MED FILL — NOVOLOG FLEXPEN U-100 INSULIN ASPART 100 UNIT/ML (3 ML) SUBCUTANEOUS: SUBCUTANEOUS | 75 days supply | Qty: 15 | Fill #0

## 2023-02-11 MED FILL — COLCHICINE 0.6 MG TABLET: ORAL | 30 days supply | Qty: 30 | Fill #4

## 2023-02-11 MED FILL — NICOTINE (POLACRILEX) 4 MG GUM: ORAL | 5 days supply | Qty: 110 | Fill #2

## 2023-02-11 MED FILL — EZETIMIBE 10 MG TABLET: ORAL | 60 days supply | Qty: 60 | Fill #3

## 2023-02-11 MED FILL — CETIRIZINE 10 MG TABLET: ORAL | 30 days supply | Qty: 30 | Fill #4

## 2023-02-11 MED FILL — AMLODIPINE 10 MG TABLET: ORAL | 90 days supply | Qty: 90 | Fill #2

## 2023-02-11 MED FILL — PREGABALIN 75 MG CAPSULE: ORAL | 30 days supply | Qty: 90 | Fill #1

## 2023-02-11 MED FILL — AMANTADINE HCL 100 MG CAPSULE: ORAL | 30 days supply | Qty: 60 | Fill #1

## 2023-02-11 NOTE — Unmapped (Signed)
Behavioral Health/Collaborative Care Management Program               Date of Service:  02/11/2023       Date of Last Encounter:  02/03/2023    Purpose of contact:     SW contacted the patient as part of BHCCM to assess interest in program/schedule intake.       Follow Up Plan/Next Steps:  []  Provide referral/linkage information:  [x]  Intake scheduled: Monday 02/17/2023  []  Other:      Additional Information/Plan:  Pt was provided with my direct office phone number in the voice message should additional needs arise.    Nolon Stalls, MSW, [facility]  Care Manager   [facility] at [facility]   [phone]

## 2023-02-12 MED ORDER — CYCLOBENZAPRINE 5 MG TABLET
ORAL_TABLET | Freq: Three times a day (TID) | ORAL | 0 refills | 30 days
Start: 2023-02-12 — End: 2023-03-14

## 2023-02-12 NOTE — Unmapped (Signed)
Upcoming Appt:  Future Appointments   Date Time Provider Department Center   02/17/2023 11:40 AM Nolon Stalls, LCSWA [facility]   02/20/2023  1:20 PM Janene Madeira, MD [facility]   02/20/2023  3:30 PM Paul Half, PA [facility]   02/26/2023  8:30 AM Cy Blamer, PA [facility]   02/27/2023  1:30 PM Beatris Ship [id number]   03/03/2023  9:30 AM Mellody Life, Nilda Simmer, AGNP [facility]   03/05/2023 11:30 AM Melba Coon, CPP [facility]   03/12/2023  2:30 PM Speights, Lestine Mount, SLP [facility]   03/17/2023  1:15 PM Barron Alvine, PT [facility]   07/18/2023  9:30 AM Zelasky, Trixie Deis, PA [facility]     Reason for Disposition   General information question, no triage required and triager able to answer question    Answer Assessment - Initial Assessment Questions  1. REASON FOR CALL or QUESTION: What is your reason for calling today? or How can I best help you? or What question do you have that I can help answer?       Pt missed a call from what she though was her therapist, Adair Laundry. Was trying to reach her back. Reported being in a crisis  Triage RN unable to complete triage of symptoms at this time. [employer] on scene.    Protocols used: Information Only Call - No Triage-A-AH

## 2023-02-13 MED ORDER — CYCLOBENZAPRINE 5 MG TABLET
ORAL_TABLET | Freq: Three times a day (TID) | ORAL | 0 refills | 30 days | Status: CP
Start: 2023-02-13 — End: 2023-03-15
  Filled 2023-02-14: qty 90, 30d supply, fill #0

## 2023-02-14 MED FILL — FLUTICASONE PROPIONATE 50 MCG/ACTUATION NASAL SPRAY,SUSPENSION: NASAL | 60 days supply | Qty: 16 | Fill #3

## 2023-02-15 ENCOUNTER — Emergency Department
Admit: 2023-02-15 | Discharge: 2023-02-15 | Disposition: A | Discharge status: Home / Self Care | Payer: PRIVATE HEALTH INSURANCE

## 2023-02-15 ENCOUNTER — Ambulatory Visit
Admit: 2023-02-15 | Discharge: 2023-02-15 | Disposition: A | Discharge status: Home / Self Care | Payer: PRIVATE HEALTH INSURANCE

## 2023-02-15 DIAGNOSIS — J441 Chronic obstructive pulmonary disease with (acute) exacerbation: Principal | ICD-10-CM

## 2023-02-15 MED ORDER — PREDNISONE 20 MG TABLET
ORAL_TABLET | Freq: Every day | ORAL | 0 refills | 5 days | Status: CP
Start: 2023-02-15 — End: 2023-02-20
  Filled 2023-02-15: qty 10, 5d supply, fill #0

## 2023-02-15 MED ORDER — AZITHROMYCIN 250 MG TABLET
ORAL_TABLET | ORAL | 0 refills | 5 days | Status: CP
Start: 2023-02-15 — End: 2023-02-21
  Filled 2023-02-15: qty 6, 5d supply, fill #0

## 2023-02-15 MED ADMIN — albuterol (PROVENTIL HFA;VENTOLIN HFA) 90 mcg/actuation inhaler 2 puff: 2 | RESPIRATORY_TRACT | @ 16:00:00 | Stop: 2023-02-15

## 2023-02-15 NOTE — Unmapped (Signed)
Pt present here with chest pain, SOB and coughing for 3 days. Denies cardiac hx

## 2023-02-15 NOTE — Unmapped (Addendum)
[facility]  Emergency Department Provider Note    ED Clinical Impression     Final diagnoses:   COPD exacerbation (CMS-HCC) (Primary)       HPI, ED Course, Assessment and Plan     Initial Clinical Impression:    February 15, 2023 9:52 AM   Tonya Wood is a 57 y.o. female with past medical history of HTN, COPD, hypothyroidism, T2DM, MS, HLD, MDD, PTSD, schizoaffective disorder, and substance abuse disorder presenting with rhinorrhea and cough. The patient states that for the past 2 weeks she has had rhinorrhea and a persistent, productive cough with associated sharp, centralized chest pain when coughing. She has used an albuterol nebulizer over the past day, which has provided some relief. She had similar symptoms in the past, and she was prescribed antibiotics and steroids, which provided relief. She is a tobacco user and has recently scaled down her smoking to 1/2 PPD, in addition to using nicotine gum and a patch, but due to her recent symptoms, she has been smoking more frequently. She endorses recent sick contact with her grandchildren who had similar URI symptoms. Denies fevers, chills, vomiting, myalgia or AMS.     BP 101/80  - Pulse 98  - Temp 36.7 ??C (98.1 ??F) (Oral)  - Resp 20  - SpO2 98%     On exam, patient is in no acute distress. Vital signs are notable borderline tachycardic to 100 and borderline hypotension to 94/76, otherwise WNL, afebrile. Exam notable for diminished breath sounds.     Medical Decision Making    Differential includes COPD exacerbation, PNA, viral infection. Plan for basic labs, hsTroponin, EKG, and XR Chest. Will give SoluMedrol and DuoNeb breathing treatment.     Further ED updates and updates to plan as per ED Course below:    ED Course:  After two IV attempts, patient refused bloodwork and IV fluids. Patient also refused nebulizer treatment. EKG shows sinus arrhythmia at 91. CXR shows no acute abnormalities. Believe COPD exacerbation is most likely given no fever and clear CXR. Patient is agreeable to discharge with prescriptions for prednisone and azithromycin. She is also given albuterol inhaler since she ran out of hers. Return precautions given.    External Records Reviewed: I have reviewed recent and relevant previous record, including: Inpatient notes - 01/16/2023 [facility] Office Visit - See HPI for review.     Independent Interpretation of Studies: I have independently interpreted the following studies:  CXR and EKG - see ED course    Social determinants that significantly affected care: Social Determinants that significantly affected care: N/A      Social Determinants of Health with Concerns     Financial Resource Strain: Not on file   Internet Connectivity: Not on file   Food Insecurity: Not on file   Tobacco Use: High Risk (01/21/2023)    Patient History     Smoking Tobacco Use: Every Day     Smokeless Tobacco Use: Never     Passive Exposure: Not on file   Housing/Utilities: Not on file   Transportation Needs: Not on file   Substance Use: Not on file   Health Literacy: Not on file   Physical Activity: Not on file   Interpersonal Safety: Not on file   Stress: Not on file   Intimate Partner Violence: Not on file   Social Connections: Not on file     _____________________________________________________________________      Past History     PAST  MEDICAL HISTORY/PAST SURGICAL HISTORY:   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/28/2021    Odontogenic infection of jaw 12/25/2018    Severe cocaine use disorder (CMS-HCC) 05/07/2019    Smoker 12/27/2019    Unsteady gait 06/02/2015       Past Surgical History:   Procedure Laterality Date    CESAREAN SECTION      FINGER SURGERY Right     ring finger reattached    FOOT SURGERY Right        MEDICATIONS:   No current facility-administered medications for this encounter.    Current Outpatient Medications:     acetaminophen (TYLENOL EXTRA STRENGTH) 500 MG tablet, Take 2 tablets (1,000 mg total) by mouth Three (3) times a day., Disp: 200 tablet, Rfl: 11    amantadine HCL (SYMMETREL) 100 mg capsule, Take 1 capsule (100 mg total) by mouth two (2) times a day., Disp: 60 capsule, Rfl: 5    amlodipine (NORVASC) 10 MG tablet, Take 1 tablet (10 mg total) by mouth daily., Disp: 90 tablet, Rfl: 3    aspirin (ECOTRIN) 81 MG tablet, Take 1 tablet (81 mg total) by mouth daily., Disp: 90 tablet, Rfl: 3    azithromycin (ZITHROMAX) 250 MG tablet, Take 2 tablets (500 mg total) by mouth daily for 1 day, THEN 1 tablet (250 mg total) daily for 5 days., Disp: 6 tablet, Rfl: 0    blood sugar diagnostic (GLUCOSE BLOOD) Strp, Test blood glucose 3 times daily., Disp: 50 strip, Rfl: 11    blood-glucose meter,continuous (FREESTYLE LIBRE 3 READER) Misc, Use as directed to test blood sugar, Disp: 1 each, Rfl: 0    blood-glucose sensor (FREESTYLE LIBRE 3 SENSOR) Devi, Change 1 sensor every fourteen (14) days., Disp: 6 each, Rfl: 3    carboxymethylcellulose sodium (REFRESH TEARS) 0.5 % Drop, Administer 2 drops to both eyes four (4) times a day as needed., Disp: 30 mL, Rfl: 3    cetirizine (ZYRTEC) 10 MG tablet, Take 1 tablet (10 mg total) by mouth daily., Disp: 90 tablet, Rfl: 3    chlorhexidine (PERIDEX) 0.12 % solution, Swish and spit out 15 mls two (2) times a day., Disp: 2365 mL, Rfl: 0    cholecalciferol, vitamin D3-1,250 mcg, 50,000 unit,, 1,250 mcg (50,000 unit) capsule, Take 1 capsule (1,250 mcg total) by mouth once a week., Disp: 24 capsule, Rfl: 0    colchicine (COLCRYS) 0.6 mg tablet, Take 1 tablet (0.6 mg total) by mouth daily., Disp: 90 tablet, Rfl: 3    cyclobenzaprine (FLEXERIL) 5 MG tablet, Take 1 tablet (5 mg total) by mouth Three (3) times a day., Disp: 90 tablet, Rfl: 0    diclofenac sodium (VOLTAREN) 1 % gel, Apply 2 g topically four (4) times a day., Disp: 200 g, Rfl: 6    dimethyl fumarate 120 mg CpDR, Take 1 capsule (120 mg) by mouth two (2) times a day for 7 days., Disp: 14 capsule, Rfl: 0    dimethyl  fumarate 240 mg CpDR, Take 1 capsule (240 mg total) by mouth two (2) times a day., Disp: 60 capsule, Rfl: 2    divalproex ER (DEPAKOTE ER) 500 MG extended released 24 hr tablet, Take 2 tablets (1,000 mg total) by mouth daily., Disp: , Rfl:     dulaglutide (TRULICITY) 1.5 mg/0.5 mL PnIj, Inject 0.5 mL (1.5 mg total) under the skin every seven (7) days., Disp: 6 mL, Rfl: 3    DULoxetine (CYMBALTA) 60 MG capsule, Take 1 capsule (60 mg total) by mouth daily., Disp: 90 capsule, Rfl: 3    empagliflozin (JARDIANCE) 25 mg tablet, Take 1 tablet (25 mg total) by mouth daily., Disp: 90 tablet, Rfl: 3    ezetimibe (ZETIA) 10 mg tablet, Take 1 tablet (10 mg total) by mouth daily., Disp: 90 tablet, Rfl: 3    fluticasone propionate (FLONASE) 50 mcg/actuation nasal spray, Use 1 spray into each nostril daily., Disp: 16 g, Rfl: 11    ibuprofen (MOTRIN) 600 MG tablet, TAKE 1 TABLET BY MOUTH EVERY 6 HOURS AS NEEDED FOR PAIN FOR UP TO 10 DAYS, Disp: , Rfl:     inhaler, assist devices (OPTICHAMBER, AEROCHAMBER, ADULT) Spcr, Use as directed with inhalers, Disp: 1 each, Rfl: 0    insulin aspart (NOVOLOG FLEXPEN) 100 unit/mL (3 mL) injection pen, Inject 0.1 mL (10 Units total) under the skin two (2) times a day. Give 10-15 minutes before a meal., Disp: 15 mL, Rfl: 4    insulin glargine (BASAGLAR, LANTUS) 100 unit/mL (3 mL) injection pen, Inject 0.56 mL (56 Units total) under the skin nightly., Disp: 15 mL, Rfl: 11    ipratropium-albuterol (DUO-NEB) 0.5-2.5 mg/3 mL nebulizer, Inhale 3 mL (contents of one nebule) by nebulization every six (6) hours as needed., Disp: 180 mL, Rfl: 3    lancets Misc, Test blood glucose 3 times daily., Disp: 100 each, Rfl: 11    levothyroxine (SYNTHROID) 100 MCG tablet, Take 1 tablet (100 mcg total) by mouth., Disp: , Rfl:     lidocaine-prilocaine (EMLA) 2.5-2.5 % cream, Apply topically Three (3) times a day as needed., Disp: 30 g, Rfl: 0    lisinopril (PRINIVIL,ZESTRIL) 20 MG tablet, Take 1 tablet (20 mg total) by mouth daily., Disp: 90 tablet, Rfl: 3    loratadine (CLARITIN) 10 mg tablet, Take 1 tablet (10 mg total) by mouth daily., Disp: , Rfl:     magnesium oxide (MAG-OX) 400 mg (241.3 mg elemental magnesium) tablet, Take 1 tablet (400 mg total) by mouth daily., Disp: 30 tablet, Rfl: 11    naloxone (NARCAN) 4 mg nasal spray, Give single spray in one nostril.  Repeat with 2nd device in other nostril every 3 min if no or minimal response until 911 arrives, Disp: 2 each, Rfl: 11    nicotine (NICODERM CQ) 21 mg/24 hr patch, Place 1 patch on the skin daily., Disp: 28 patch, Rfl: 2    nicotine polacrilex (NICORETTE) 4 MG gum, Take 1 each (4 mg total) by mouth every hour as needed., Disp: 110 each, Rfl: 2    ondansetron (ZOFRAN) 4 MG tablet, Take 1 tablet (4 mg total) by mouth daily as needed for nausea., Disp: 30 tablet, Rfl: 1    oxyCODONE (ROXICODONE) 5 MG immediate release tablet, Take 1 tablet (5 mg total) by mouth two (2) times a day as needed for pain., Disp: 60 tablet, Rfl: 0    polyethylene glycol (CLEARLAX) 17 gram/dose powder, Take as directed for extended bowel prep., Disp: 238 g,  Rfl: 0    predniSONE (DELTASONE) 20 MG tablet, Take 2 tablets (40 mg total) by mouth daily for 5 days., Disp: 10 tablet, Rfl: 0    pregabalin (LYRICA) 75 MG capsule, Take 1 capsule (75 mg total) by mouth Three (3) times a day., Disp: 90 capsule, Rfl: 2    QUEtiapine (SEROQUEL) 200 MG tablet, Take 1 tablet (200 mg total) by mouth nightly., Disp: 90 tablet, Rfl: 3    TECHLITE PEN NEEDLE 32 gauge x 1/4 (6 mm) Ndle, , Disp: , Rfl:     umeclidinium-vilanteroL (ANORO ELLIPTA) 62.5-25 mcg/actuation inhaler, Inhale 1 puff daily., Disp: 180 each, Rfl: 3    varenicline (CHANTIX) 1 mg tablet, , Disp: , Rfl:     VENTOLIN HFA 90 mcg/actuation inhaler, Inhale 2 puffs every six (6) hours as needed for wheezing., Disp: 18 g, Rfl: 5    ALLERGIES:   Grass pollen-orchardgrass, standard    SOCIAL HISTORY:   Social History     Tobacco Use    Smoking status: Every Day     Current packs/day: 2.00     Average packs/day: 2.0 packs/day for 37.3 years (74.7 ttl pk-yrs)     Types: Cigarettes     Start date: 10/17/1985    Smokeless tobacco: Never   Substance Use Topics    Alcohol use: No       FAMILY HISTORY:  Family History   Problem Relation Age of Onset    Asthma Mother     Cancer Son     Cancer Other     Breast cancer Neg Hx         Review of Systems     A review of systems was performed and relevant portions were as noted above in HPI     Physical Exam     VITAL SIGNS:    BP 101/80  - Pulse 98  - Temp 36.7 ??C (98.1 ??F) (Oral)  - Resp 20  - SpO2 98%     Constitutional:   Alert and oriented.   Head:   Normocephalic and atraumatic  Eyes:   Conjunctivae are normal, EOMI, PERRL  ENT:   No notable congestion, Mucous membranes moist, External ears normal, no notable stridor  Cardiovascular:   Rate as vitals above. Appears warm and well perfused  Respiratory:   Normal respiratory effort. Diminished breath sounds bilaterally.   Gastrointestinal:   Soft, non-distended, and nontender.   Genitourinary:   Deferred  Musculoskeletal:    Normal range of motion in all extremities. No tenderness or edema noted in B/L lower extremities  Neurologic:   No gross focal neurologic deficits beyond baseline are appreciated.  Skin:   Skin is warm, dry and intact.       Radiology     XR Chest 1 view Portable   Final Result      No acute abnormalities.          Labs     Labs Reviewed - No data to display        Pertinent labs & imaging results that were available during my care of the patient were reviewed by me and considered in my medical decision making (see chart for details).    Please note- This chart has been created using [employer]. Chart creation errors have been sought, but may not always be located and such creation errors, especially pronoun confusion, do NOT reflect on the standard of medical care.    Documentation assistance was provided by  Ricka Burdock and Johnathan Hausen, [employer] on February 15, 2023 at 09:53 for Loel Dubonnet, DO.        Sindy Messing, DO  02/17/23 1030       Sindy Messing, [place]  03/22/23 [phone]

## 2023-02-17 ENCOUNTER — Ambulatory Visit: Admit: 2023-02-17 | Payer: PRIVATE HEALTH INSURANCE

## 2023-02-17 NOTE — Unmapped (Incomplete)
Behavioral Health/Collaborative Care Management Program               Date of Service:  02/17/2023       Date of Last Encounter:  02/17/2023    Purpose of contact:     SW contacted the patient as part of BHCCM to assess interest in program/ reschedule intake since patient did not answer call. Will continue to follow-up with patient as patient will benefit from brief therapy and ongoing consultation with treatment team.    02/20/23- Patient in the office today and informed colleague that she wanted an appt Tuesday 6/11. SW called patient and left a message of the date and time and my contact information, incase the time does not work.     Follow Up Plan/Next Steps:  []  Provide referral/linkage information:  [x]  Intake scheduled: 02/25/23 at 11:40 am   []  Other:      Additional Information/Plan:  Pt was provided with my direct office phone number in the voice message should additional needs arise.    Nolon Stalls, MSW, LCSWA  Care Manager   [facility] at [facility]   (p) [phone] Married   Tobacco Use    Smoking status: Every Day     Current packs/day: 2.00     Average packs/day: 2.0 packs/day for 37.3 years (74.7 ttl pk-yrs)     Types: Cigarettes     Start date: 10/17/1985    Smokeless tobacco: Never   Vaping Use    Vaping status: Never Used   Substance and Sexual Activity    Alcohol use: No    Drug use: Never    Sexual activity: Not Currently       Supports identified: {FAMILY SUPPORT:21148}    Belief System: (Cultural, Spiritual, Religious): ***     Treatment Goals/Plans:  ***    Follow Up Plan/Next Steps:   ***  ***Use (dot)CoCMNOSHOWPOLICY in AVS and review verbally with patient (delete this line afterwards)    Additional Information/Plan:  Pt was provided with my direct office phone number should additional needs arise.      Nolon Stalls, LCSWA

## 2023-02-19 MED FILL — FREESTYLE LIBRE 3 SENSOR DEVICE: 84 days supply | Qty: 6 | Fill #0

## 2023-02-19 NOTE — Unmapped (Signed)
Insurance company approved [place] for Oxycodone from 01/13/23-07/12/23

## 2023-02-20 ENCOUNTER — Ambulatory Visit
Admit: 2023-02-20 | Discharge: 2023-02-21 | Payer: PRIVATE HEALTH INSURANCE | Attending: Student in an Organized Health Care Education/Training Program | Primary: Student in an Organized Health Care Education/Training Program

## 2023-02-20 ENCOUNTER — Ambulatory Visit: Admit: 2023-02-20 | Discharge: 2023-02-21 | Payer: PRIVATE HEALTH INSURANCE

## 2023-02-20 LAB — BASIC METABOLIC PANEL
ANION GAP: 4 mmol/L — ABNORMAL LOW (ref 5–14)
BLOOD UREA NITROGEN: 9 mg/dL (ref 9–23)
BUN / CREAT RATIO: 16
CALCIUM: 10 mg/dL (ref 8.7–10.4)
CHLORIDE: 106 mmol/L (ref 98–107)
CO2: 32 mmol/L — ABNORMAL HIGH (ref 20.0–31.0)
CREATININE: 0.58 mg/dL
EGFR CKD-EPI (2021) FEMALE: >90 mL/min/{1.73_m2} (ref >=60)
GLUCOSE RANDOM: 99 mg/dL (ref 70–179)
POTASSIUM: 3.2 mmol/L — ABNORMAL LOW (ref 3.4–4.8)
SODIUM: 142 mmol/L (ref 135–145)

## 2023-02-20 LAB — TOXICOLOGY SCREEN, URINE
AMPHETAMINE SCREEN URINE: NEGATIVE
BARBITURATE SCREEN URINE: NEGATIVE
BENZODIAZEPINE SCREEN, URINE: NEGATIVE
BUPRENORPHINE, URINE SCREEN: NEGATIVE
CANNABINOID SCREEN URINE: NEGATIVE
COCAINE(METAB.)SCREEN, URINE: NEGATIVE
FENTANYL SCREEN, URINE: NEGATIVE
METHADONE SCREEN, URINE: NEGATIVE
OPIATE SCREEN URINE: NEGATIVE
OXYCODONE SCREEN URINE: NEGATIVE

## 2023-02-20 MED ORDER — AMOXICILLIN 500 MG CAPSULE
ORAL_CAPSULE | Freq: Three times a day (TID) | ORAL | 0 refills | 7 days | Status: CP
Start: 2023-02-20 — End: 2023-02-27
  Filled 2023-02-20: qty 42, 7d supply, fill #0

## 2023-02-20 MED ORDER — ALBUTEROL SULFATE 2.5 MG/3 ML (0.083 %) SOLUTION FOR NEBULIZATION
RESPIRATORY_TRACT | 0 refills | 30 days | Status: CP
Start: 2023-02-20 — End: 2024-02-20
  Filled 2023-02-20: qty 540, 30d supply, fill #0

## 2023-02-20 MED ORDER — VENTOLIN HFA 90 MCG/ACTUATION AEROSOL INHALER
Freq: Four times a day (QID) | RESPIRATORY_TRACT | 5 refills | 25 days | Status: CP | PRN
Start: 2023-02-20 — End: ?

## 2023-02-20 NOTE — Unmapped (Signed)
[facility]  Care Management Progress Note               Purpose of contact: SW met with patient during PCP appointment to discuss mental health support.    SW assisted patient in rescheduling BHCM intake for 6/11 at 11:40am. SW talked with patient about the program and the supports it can provide.     Patient has SW's contact information and SW encouraged patient to call SW with any questions.    Provider/Care Partner(s) to follow up on: N/A    Shea Stakes, CCM  [facility]  [facility]  [phone]

## 2023-02-20 NOTE — Unmapped (Signed)
Thank you for choosing [facility]!  We appreciate the opportunity to participate in your care.       If any questions or concerns arise after your visit, please do not hesitant to contact me by [facility] MyChart or by calling the total joint team at [phone].     Voicemail messages: Messages are checked between 8:00 am- 4:00 pm Monday- Friday.     MyChart messages: These messages are checked by the nurses during normal business hours 8:30 am-4:30 pm Monday-Friday every 24-48 hours and are for non-urgent, non-emergent concerns. You may be asked to return for a follow up visit if it is deemed your questions are best handled in the clinic setting.    If you have an issue that requires emergent attention that cannot wait; either call the Orthopaedics resident on call at [phone], consider coming to our [facility] walk-in clinic, or go to the nearest Emergency Department.    If you need to schedule future appointments, please call [phone].      Please let me know if I can be of assistance with this or other orthopaedic issues in the future.

## 2023-02-20 NOTE — Unmapped (Signed)
-   schedule duo-neb four times a day

## 2023-02-20 NOTE — Unmapped (Signed)
[facility]    Problem List Items Addressed This Visit          Endocrine    Type 2 diabetes mellitus (CMS-HCC) - Primary    Relevant Orders    Basic metabolic panel (Completed)       Respiratory    COPD (chronic obstructive pulmonary disease) (CMS-HCC)    Relevant Medications    albuterol 2.5 mg /3 mL (0.083 %) nebulizer solution    VENTOLIN HFA 90 mcg/actuation inhaler    amoxicillin (AMOXIL) 500 MG capsule       Nervous and Auditory    Multiple sclerosis (CMS-HCC)     Other Visit Diagnoses       Chronic pain syndrome        Relevant Medications    oxyCODONE (ROXICODONE) 5 MG immediate release tablet (Start on 03/11/2023)    Upper respiratory tract infection, unspecified type        Relevant Medications    albuterol 2.5 mg /3 mL (0.083 %) nebulizer solution    VENTOLIN HFA 90 mcg/actuation inhaler    amoxicillin (AMOXIL) 500 MG capsule    Other Relevant Orders    RAPID INFLUENZA/RSV/COVID PCR (Completed)    Community acquired pneumonia, unspecified laterality        Relevant Medications    albuterol 2.5 mg /3 mL (0.083 %) nebulizer solution    VENTOLIN HFA 90 mcg/actuation inhaler    amoxicillin (AMOXIL) 500 MG capsule    COPD exacerbation (CMS-HCC)        Relevant Medications    albuterol 2.5 mg /3 mL (0.083 %) nebulizer solution    VENTOLIN HFA 90 mcg/actuation inhaler            ASSESSMENT/PLAN:       Follow up: Pain, diabetes, depression, COPD inhaler    #COPD  -Will get COVID flu RSV swab today.  Suspect that this is a COPD exacerbation and not pneumonia but given that she is feeling so poorly and still having sputum production that is green and yellow in color, will also prescribe 7-day course of Augmentin.  Recommended that patient scheduled DuoNebs and albuterol for the next few days to weeks until her symptoms improve.  Will need follow-up on her inhaler as based on history, she has not had her daily steroid/LABA inhaler for the last month.    #Pain  - Refill for oxy provided. PDMP reviewed. Will need UDS and confirmatory screen as her prior UDS was negative for oxycodone.  If her most recent UDS and opiate confirmatory screening is negative for oxycodone, will have to have a open discussion about her opioids and that we will unlikely be able to continue prescribing.     #Diabetes  - See treatment plan note from 5/22; on jardiance 25mg  and trulicity 1.5mg ; lantus 46 units.  Although it is unclear how much insulin she is getting and how this correlates to her a.m. sugars, her A1c is much below goal.  Will continue to come down on Lantus and recommended patient cut down to Lantus 20 units daily.     - BMP today     #Depression  - enrolled in [facility] with intake on 6/3.  Would likely benefit from additional medication but given polypharmacy, will hold off on additional medicine.  Also suspect other psychiatric comorbidities that is likely a result of prior trauma.  She is currently on Cymbalta 60 mg.  Has protective factors including her grandchildren and often talks  about future events that she wants to continue to go to.  Suspect that behavioral health care will be able to help her slowly with talk therapy and connecting her to a local therapy resources.    SUBJECTIVE:  HPI: Tonya Wood is a 57 y.o. female who presents to clinic today for the following issues:  -Recently lost her ex fianc?? who passed away.  Has grandchildren graduation is next few days and although her mood is terrible and she has suicidal ideation, her grandchildren keep her going.  - enrolled in [facility] with intake on 6/3.  Patient reports she never received a call.  -Has not been able to eat well due to feeling sick.  Her sugars have been in the 110s to 150 her long-acting insulin unclear what her sugars have is that she has not been consistent with her medicines and has not been checking her Dexcom regularly.  - went to hospital 4-5 days ago (sick with cold) discharged with azithromycin and prednisone  - all day cough (sick for about a week); Monday and Tuesday had temp of 100. Grandchildren with colds  - lost albuterol  - using duo-neb 2-3 times a day    OBJECTIVE:  BP 112/83 (BP Site: L Arm, BP Position: Sitting, BP Cuff Size: Medium)  - Pulse 98  - Ht 170.2 cm (5' 7.01)  - Wt 94 kg (207 lb 3.2 oz)  - BMI 32.44 kg/m??       Physical Exam  Constitutional: Alert and oriented to person, place, and time. Well appearing and in no acute distress.  Eyes: Conjunctivae are normal. PERRL, EOMI.   ENT       Head: Normocephalic and atraumatic.       Nose: No congestion.       Mouth/Throat: Mucous membranes are moist.       Neck: No stridor, neck is supple.  Hematological/Lymphatic/Immunilogical: No cervical lymphadenopathy.  Cardiovascular: Normal rate, regular rhythm. Normal and symmetric distal pulses are present in all extremities.   Respiratory: Normal respiratory effort.  Rhonchorous breath sounds bilaterally but no wheeze or crackles.  Musculoskeletal: Nontender with normal range of motion in all extremities.       Right lower leg: No tenderness or edema.       Left lower leg: No tenderness or edema.  Neurologic: Normal speech and language. No gross focal neurologic deficits are appreciated.    Skin: Skin is warm, dry and intact. No rash noted.  Psychiatric: Mood and affect are normal. Speech and behavior are normal.    Tonya Madeira, MD   [facility] called to        [facility]  [id number], [place], [place] [address]    Telephone [phone]  Fax [phone]  [email or web address]

## 2023-02-20 NOTE — Unmapped (Unsigned)
Encounter Provider: Jake Seats, PA  Date of Service: 02/20/2023    Primary Care Physician: Janene Madeira, MD      ASSESSMENT:  Tonya Wood is a 57 y.o. female with the following visit diagnoses:    ICD-10-CM   1. Chronic pain of left knee  M25.562    G89.29       PLAN:    57 y.o. female with severe left knee osteoarthritis. We discussed the natural history of osteoarthritis and reviewed the imaging studies together.  We discussed treatment options to include continued conservative management with medications, injections, and activity modifications versus surgical intervention, specifically total knee arthroplasty. The patient has not responded to all other non-operative modalities. At this time the patient is most interested in pursuing total knee arthroplasty for definitive treatment and restoration of function, reduction in pain and thus quality of life.  We had an in-depth discussion regarding the risks, benefits, operative procedure, and postoperative expectations.      Before patient can proceed, they will need to reach the following requirements:    -clearance from PCP  -clearance from dentist  -clearance from pain management if appropriate for post-procedural analgesia recommendations     Surgical clearance forms were given to the patient to complete. Once they are approved by the corresponding provider and returned to [place], the patient will be scheduled for evaluation by a surgeon to determine if they are a suitable candidate.     Requested Prescriptions      No prescriptions requested or ordered in this encounter          SUBJECTIVE:    Chief complaint: Left knee pain     History of Present Illness:   57 y.o. female who presents for evaluation of left knee    Patient denies numbness/tingling distally.         Review of Systems Pertinent positives and negatives are documented in the HPI. All other systems reviewed are negative.   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    Odontogenic infection of jaw 12/25/2018    Severe cocaine use disorder (CMS-HCC) 05/07/2019    Smoker 12/27/2019    Unsteady gait 06/02/2015      Surgical History Past Surgical History:   Procedure Laterality Date    CESAREAN SECTION      FINGER SURGERY Right     ring finger reattached    FOOT SURGERY Right       Allergies Grass pollen-orchardgrass, standard   Medications She has a current medication list which includes the following prescription(s): acetaminophen, albuterol, amantadine hcl, amlodipine, amoxicillin, aspirin, azithromycin, glucose blood, freestyle libre 3 reader, freestyle libre 3 sensor, carboxymethylcellulose sodium, cetirizine, chlorhexidine, cholecalciferol (vitamin d3-1,250 mcg (50,000 unit)), colchicine, cyclobenzaprine, diclofenac sodium, dimethyl fumarate, dimethyl fumarate, divalproex er, trulicity, duloxetine, empagliflozin, ezetimibe, fluticasone propionate, ibuprofen, optichamber (aerochamber) adult, insulin aspart, insulin glargine, ipratropium-albuterol, lancets, levothyroxine, lidocaine-prilocaine, lisinopril, loratadine, magnesium oxide, naloxone, nicotine, nicotine polacrilex, ondansetron, [START ON 03/11/2023]  oxycodone, polyethylene glycol, prednisone, pregabalin, quetiapine, techlite pen needle, umeclidinium-vilanterol, varenicline, and ventolin hfa.   Family History Her family history includes Asthma in her mother; Cancer in her son and another family member.   Social History She reports that she has been smoking cigarettes. She started smoking about 37 years ago. She has a 74.7 pack-year smoking history. She has never used smokeless tobacco. She reports that she does not drink alcohol and does not use drugs.Home address:[address]  [place] [address]        Occupational History    Not on file     Social History     Socioeconomic History    Marital status: Married   Tobacco Use    Smoking status: Every Day     Current packs/day: 2.00     Average packs/day: 2.0 packs/day for 37.3 years (74.7 ttl pk-yrs)     Types: Cigarettes     Start date: 10/17/1985    Smokeless tobacco: Never   Vaping Use    Vaping status: Never Used   Substance and Sexual Activity    Alcohol use: No    Drug use: Never    Sexual activity: Not Currently        OBJECTIVE:  DETAILED PHYSICAL EXAM (12 Point)  General Appearance well-nourished, in no acute distress.   Mood and Affect alert, cooperative and pleasant.   Pulmonary No labored breathing or shortness of breath   Cardiovascular well-perfused distally and no edema.   Lymphatics No lymphadenopathy   Sensation sensation to light touch distally: Normal   MUSCULOSKELETAL    Left Knee  Inspection/palpation  Range of motion  Stability  Strength  Skin Inspection: Mild soft tissue swelling,  negative erythema,  negative deformity, negative ecchymosis  Palpation: Tender: medial joint line   Range of motion:  normal   Strength:  4/5  Special Tests:   Negative McMurray's test  Negative Lachman's test  Negative Posterior Drawer test  Negative Valgus stress test  Negative Varus stress test  Negative Patellar apprehension test  Skin:  Warm, dry, and intact     Test Results    Imaging:  Recent images of the affected area were independently interpreted by me and reveal moderate to severe medial and patellofemoral compartment degenerative changes.  These had been obtained by another provider on 11/15/2022.    Procedure  None    MEDICAL DECISION MAKING (level of service defined by 2/3 elements)     Number/Complexity of Problems Addressed 1 or more chronic illnesses with exacerbation, progression, or side effects of treatment (99204/99214)   Amount/Complexity of Data to be Reviewed/Analyzed Independent interpretation of a test performed by another physician/other qualified health care professional (99204/99214)   Risk of Complications/Morbidity/Mortality of Management Decision for MAJOR Surgery (90-day global) WITHOUT Risk Factors (99204/99214)

## 2023-02-21 LAB — CARDIOLIPIN ANTIBODY, IGM/IGG
ANTICARDIOLIPIN IGG ANTIBODY: 4 [GPL'U] (ref 0.0–23.0)
ANTICARDIOLIPIN IGM ANTIBODY: 18 [MPL'U] — ABNORMAL HIGH (ref 0.0–11.0)

## 2023-02-21 LAB — BETA-2 GLYCOPROTEIN ANTIBODIES
BETA-2 GLYCOPROTEIN 1 IGG ANTIBODY: 1 (ref <20.0)
BETA-2 GLYCOPROTEIN 1 IGM ANTIBODY: 1 (ref <20.0)

## 2023-02-22 NOTE — Unmapped (Signed)
Immediately after or during the visit, I reviewed with the resident the medical history and the resident???s findings on physical examination.  I discussed with the resident the patient???s diagnosis and concur with the treatment plan as documented in the resident note. Maize Brittingham M Conlin Brahm, MD

## 2023-02-24 LAB — LUPUS INHIBITOR PANEL
DILUTE RUSSELL VIPER VENOM TIME: 36.5 s (ref 0.0–42.4)
PTT LUPUS ANTICOAGULANT: 38.8 s (ref 0.0–45.2)

## 2023-02-25 LAB — LUPUS INHIBITOR PANEL
DILUTE RUSSELL VIPER VENOM TIME: 36.5 s (ref 0.0–42.4)
PTT LUPUS ANTICOAGULANT: 38.8 s (ref 0.0–45.2)

## 2023-02-25 NOTE — Unmapped (Signed)
Medication Request     Patient Name: Tonya Wood   Caller: Self (Patient)  Last Visit: 02/20/2023       Patient is requesting a prescription for a yeast infection. She states she has been put on antibiotics and is now itching really badly.      Pharmacy (Name & Address): [facility], [place], [place]

## 2023-02-25 NOTE — Unmapped (Unsigned)
Behavioral Health Care Management Program    Date of Service:  02/26/2023        Date of Last Encounter:  02/25/2023    Purpose of contact:     Care manager called patient to complete [facility] intake. Since patient has had multiple rescheduling, SW discussed the program again with patient and assess interest in the program. Patient stated, I just have a lot going on right  now and just can't do this. SW informed patient that I will dis enroll her from the program, but if she would like to try again in the future, she can contact us.     SW disenrolled patient from the [facility] program due to lack of engagement.       Additional Information/Plan:  Pt was provided with my direct office phone number should additional needs arise.      Nolon Stalls, MSW, LCSWA  Care Manager   [facility]   [phone] Socioeconomic History    Marital status: Married   Tobacco Use    Smoking status: Every Day     Current packs/day: 2.00     Average packs/day: 2.0 packs/day for 37.4 years (74.7 ttl pk-yrs)     Types: Cigarettes     Start date: 10/17/1985    Smokeless tobacco: Never   Vaping Use    Vaping status: Never Used   Substance and Sexual Activity    Alcohol use: No    Drug use: Never    Sexual activity: Not Currently       Supports identified: {FAMILY SUPPORT:21148}    Belief System: (Cultural, Spiritual, Religious): ***     Treatment Goals/Plans:  ***    Follow Up Plan/Next Steps:   ***  ***Use (dot)CoCMNOSHOWPOLICY in AVS and review verbally with patient (delete this line afterwards)    Additional Information/Plan:  Pt was provided with my direct office phone number should additional needs arise.      Nolon Stalls, LCSWA

## 2023-02-26 ENCOUNTER — Ambulatory Visit: Admit: 2023-02-26 | Discharge: 2023-02-26 | Payer: PRIVATE HEALTH INSURANCE

## 2023-02-26 ENCOUNTER — Ambulatory Visit
Admit: 2023-02-26 | Discharge: 2023-02-26 | Payer: PRIVATE HEALTH INSURANCE | Attending: Physician Assistant | Primary: Physician Assistant

## 2023-02-26 DIAGNOSIS — G35 Multiple sclerosis: Principal | ICD-10-CM

## 2023-02-26 LAB — CBC W/ AUTO DIFF
BASOPHILS ABSOLUTE COUNT: 0 10*9/L (ref 0.0–0.1)
BASOPHILS RELATIVE PERCENT: 0.2 %
EOSINOPHILS ABSOLUTE COUNT: 0.1 10*9/L (ref 0.0–0.5)
EOSINOPHILS RELATIVE PERCENT: 2 %
HEMATOCRIT: 42.2 % (ref 34.0–44.0)
HEMOGLOBIN: 14 g/dL (ref 11.3–14.9)
LYMPHOCYTES ABSOLUTE COUNT: 2.4 10*9/L (ref 1.1–3.6)
LYMPHOCYTES RELATIVE PERCENT: 39.2 %
MEAN CORPUSCULAR HEMOGLOBIN CONC: 33.2 g/dL (ref 32.0–36.0)
MEAN CORPUSCULAR HEMOGLOBIN: 30.3 pg (ref 25.9–32.4)
MEAN CORPUSCULAR VOLUME: 91.2 fL (ref 77.6–95.7)
MEAN PLATELET VOLUME: 8.5 fL (ref 6.8–10.7)
MONOCYTES ABSOLUTE COUNT: 0.5 10*9/L (ref 0.3–0.8)
MONOCYTES RELATIVE PERCENT: 9 %
NEUTROPHILS ABSOLUTE COUNT: 3 10*9/L (ref 1.8–7.8)
NEUTROPHILS RELATIVE PERCENT: 49.6 %
PLATELET COUNT: 260 10*9/L (ref 150–450)
RED BLOOD CELL COUNT: 4.63 10*12/L (ref 3.95–5.13)
RED CELL DISTRIBUTION WIDTH: 14.1 % (ref 12.2–15.2)
WBC ADJUSTED: 6.1 10*9/L (ref 3.6–11.2)

## 2023-02-26 LAB — HEPATIC FUNCTION PANEL
ALBUMIN: 4.2 g/dL (ref 3.4–5.0)
ALKALINE PHOSPHATASE: 80 U/L (ref 46–116)
ALT (SGPT): 21 U/L (ref 10–49)
AST (SGOT): 18 U/L (ref <=34)
BILIRUBIN DIRECT: 0.1 mg/dL (ref 0.00–0.30)
BILIRUBIN TOTAL: 0.2 mg/dL — ABNORMAL LOW (ref 0.3–1.2)
PROTEIN TOTAL: 7.1 g/dL (ref 5.7–8.2)

## 2023-02-26 MED ORDER — FLUCONAZOLE 150 MG TABLET
ORAL_TABLET | Freq: Once | ORAL | 0 refills | 1 days | Status: CP
Start: 2023-02-26 — End: 2023-02-27
  Filled 2023-02-27: qty 1, 1d supply, fill #0

## 2023-02-26 MED ORDER — DIMETHYL FUMARATE 240 MG CAPSULE,DELAYED RELEASE
ORAL_CAPSULE | Freq: Two times a day (BID) | ORAL | 1 refills | 90 days | Status: CP
Start: 2023-02-26 — End: ?

## 2023-02-26 MED ORDER — DIAZEPAM 2 MG TABLET
ORAL_TABLET | ORAL | 0 refills | 0 days | Status: CP
Start: 2023-02-26 — End: ?
  Filled 2023-02-27: qty 2, 1d supply, fill #0

## 2023-02-26 NOTE — Unmapped (Signed)
Attempted to call patient. She is not available. Advise to call us back at Dr. Selena Batten office.

## 2023-02-26 NOTE — Unmapped (Signed)
Please call pt back, she is calling in about the meds she requested Ph [phone]

## 2023-02-26 NOTE — Unmapped (Addendum)
[facility] at [facility]  Multiple Sclerosis / Neuroimmunology Division  Phyllis Whitefield Doreatha Massed  Physician Assistant    Phone: [phone]  Fax: [phone]      Patient Name: Tonya Wood   Date of Birth: 10/18/1965  Medical Record Number: [id number]  [address]  [place] [address]     Direct entry by:  Cy Blamer, PA-C.    DATE OF VISIT: February 26, 2023    REASON FOR VISIT: Followup in the Neuroimmunology Clinic for evaluation of Multiple Sclerosis / Demyelinating Disease / or other Neurological problem.    Billed on complexity of visit not time.    During this visit the diagnostic and treatment plan were discussed with the patient in the shared decision making process, Treatment goals and medication safety profile have been discussed with the patient.    Patient was evaluated by Dr. Johnnye Lana 10/16/2022. Please refer to this note for details on patient's history of present illness.     ASSESSMENT AND PLAN:  A 57 y.o. African American female , RHD who has Adjustment disorder with mixed anxiety and depressed mood; Amaurosis fugax of right eye; Cocaine abuse with cocaine-induced mood disorder (CMS-HCC); Colonic polyp; COPD (chronic obstructive pulmonary disease) (CMS-HCC); Essential hypertension; Hallux rigidus of right foot; Hyperlipidemia; Hypothyroidism; Impaired cognition; MDD (major depressive disorder), recurrent episode, severe (CMS-HCC); Obesity; Onychomycosis; Post traumatic stress disorder (PTSD); Right sided weakness; Schizoaffective disorder, bipolar type (CMS-HCC); Severe tobacco use disorder; Type 2 diabetes mellitus (CMS-HCC); Healthcare maintenance; Knee pain; Allergic rhinitis; Ganglion cyst of dorsum of left wrist; and Multiple sclerosis (CMS-HCC) on their problem list.    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  Symptom onset in 2020  Disease onset: relapsing, possibly now in the progressive phase  05/21/2022 ANA positive 1;80, which is a non specific titer, ENA negative, RF negative, RPR non reactive, HIV negative, NMO IgG and MOG IgG negative  05/19/2022 Brain MRI shows questionable faint white matter changes in corpus callosum, lesions in deep white matter and brainstem.   05/03/2022 C spine MRI,  lesion on right cord at the level of C3, moderate cervical spondylosis, including mild/moderate canal narrowing, worst at C4-C5. Varying degrees of neural foraminal narrowing, worst at C4-C6, and T spine MRI study on 05/19/2022 showed no lesions in the thoracic spine.   Lesions are suggestive of a CNS demyelinating disease.   01/31/2024Laboratory screening for MS mimicking diseases: Extractable Nuclear Antigen Screen (ENA); - Anti-neutrophilic Cytoplasmic Antibody (ANCA); - Autoimmune myelopathy panel; - Copper, serum; - Vitamin A; - Vitamin E; - HTLV I/II Antibody; - Quantiferon TB Gold Plus; - Hepatitis B Core Antibody, total; - Hepatitis C Antibody= WNL or NCS .ANTIPHOSPHOLIPID Panel=  abnormal.  10/31/2022 LP IgG index = 1.2 and 2 or more OGBs.    -Started DMF 12/2022.  -Order CBC/diff and LFT for drug safety monitoring.  -Order re- baseline MRI of the  Brain, Cervical , and Thoracic spine with / without contrast and  Valium 2 mg, #2 pills for claustrophobia.   Schedule for  06/2023.     -Completed Neuro-Ophthalmology on 11/27/2022, no Neuro-Ophthalmological issues, follow up prn.    **Abnormal APLP:  Repeat APLP , This is low titer, low titers are not clinically significant, so can stay on Aspirin, no need for anticoagulants from APLP perspective. .  No history of miscarriages.    **Fatigue:  -Continue  Amantadine 100 mg BID.    **Neuropathic bladder:  -Refer  to [facility], scheduled.    **Swallowing problems:  -Referral to Speech therapy, scheduled.    **Return to clinic 4-5 months after MRI's.     INTERVAL HISTORY / CHIEF COMPLAINT :    Current symptoms: (If blank =  none).  Bladder / bowel / sexual function: constipation, hard to urinate at times.No leakage, some urgency.No UTI.  Fatigue: Interferes with ADL's >50%. Amantadine helps .  Depression / decrease in mentation / Euphoria:  PHQ9 = 15, denies SI/HI.    Vision/double vision:  Speech, swallowing problems: Sometimes unable to swallow, 3-4 times a month , both food and liquids.  Weakness: Worsening of right sided weakness, right leg gives out. Seen by Ortho.  Tingling/numbness/pain: Oxycodone, naprosyn for right arm pain by PCP.  Balance/coordination problems:  Memory, mood: Forgetfulness  Gait:  Falls:  Headaches:   Seizures:  Other symptoms: One cane, since 6 months ago.    VITAL SIGNS  BP 112/70 (BP Site: L Arm, BP Position: Sitting, BP Cuff Size: Medium)  - Pulse 85  - Ht 170.2 cm (5' 7)  - Wt 92.5 kg (204 lb)  - BMI 31.95 kg/m??     PHYSICAL EXAMINATION:  GENERAL:  Alert and oriented to person, place, time and situation.    Recent and remote memory intact.      Neurological Examination:   Cranial Nerves:   II, III- Pupils are equal 3 mm and reactive to light b/l.  Visual Acuity:  with  glasses, OD 20/200, OS 20/200 with old  glasses.  III, IV, VI- extra ocular movements are intact, No ptosis, no nystagmus.  V- sensation of the face intact b/l.  VII- face symmetrical, no facial droop, normal facial movements with smile/grimace  VIII- Hearing grossly intact.  IX and X- symmetric palate contraction.  XI- Full shoulder shrug bilaterally  XII- Tongue protrudes midline, full range of movements of the tongue   Motor Exam:      Muscles UEs   LEs     R L   R L   Deltoids 5/5 5/5 Hip flexors  5/5 5/5   Biceps 5/5 5/5 Hip extensors 5/5 5/5   Triceps 5/5 5/5 Knee flexors 5/5 5/5   Hand grip 5/5 5/5 Knee extensors 5/5 5/5      Foot dorsal flexors 5/5 5/5      Foot plantar flexors 5/5 5/5      Normal bulk and tone.  No clonus.    Reflexes R L   Brachioradialis +1 +1   Biceps +1 +1   Triceps +1 +1   Patella +1 +1   Achilles +1 +1     Negative babinski.    Sensory UEs LEs    R L R L   Light touch WNL WNL WNL WNL   Pin prick WNL WNL WNL WNL   Vibration, >10 seconds  WNL WNL WNL WNL   Proprioception   WNL WNL      Cerebellar/Coordination:  Rapid alternating movements, finger-to-nose and heel-to-shin  bilaterally demonstrates no abnormalities.    Romberg was negative with eyes closed.    Gait: slow, wide base, ataxic, Unable to tandem, heel, and toe gait without difficulty.   Using walker.    REVIEW OF SYSTEMS:  A 10-systems review was performed and, unless otherwise noted, declared negative by patient.    Office Visit on 02/26/2023   Component Date Value Ref Range Status    Albumin 02/26/2023 4.2  3.4 - 5.0 g/dL Final  Total Protein 02/26/2023 7.1  5.7 - 8.2 g/dL Final    Total Bilirubin 02/26/2023 0.2 (L)  0.3 - 1.2 mg/dL Final    Bilirubin, Direct 02/26/2023 0.10  0.00 - 0.30 mg/dL Final    AST 16/06/9603 18  <=34 U/L Final    ALT 02/26/2023 21  10 - 49 U/L Final    Alkaline Phosphatase 02/26/2023 80  46 - 116 U/L Final    WBC 02/26/2023 6.1  3.6 - 11.2 10*9/L Final    RBC 02/26/2023 4.63  3.95 - 5.13 10*12/L Final    HGB 02/26/2023 14.0  11.3 - 14.9 g/dL Final    HCT 54/05/8118 42.2  34.0 - 44.0 % Final    MCV 02/26/2023 91.2  77.6 - 95.7 fL Final    MCH 02/26/2023 30.3  25.9 - 32.4 pg Final    MCHC 02/26/2023 33.2  32.0 - 36.0 g/dL Final    RDW 14/78/2956 14.1  12.2 - 15.2 % Final    MPV 02/26/2023 8.5  6.8 - 10.7 fL Final    Platelet 02/26/2023 260  150 - 450 10*9/L Final    Neutrophils % 02/26/2023 49.6  % Final    Lymphocytes % 02/26/2023 39.2  % Final    Monocytes % 02/26/2023 9.0  % Final    Eosinophils % 02/26/2023 2.0  % Final    Basophils % 02/26/2023 0.2  % Final    Absolute Neutrophils 02/26/2023 3.0  1.8 - 7.8 10*9/L Final    Absolute Lymphocytes 02/26/2023 2.4  1.1 - 3.6 10*9/L Final    Absolute Monocytes 02/26/2023 0.5  0.3 - 0.8 10*9/L Final    Absolute Eosinophils 02/26/2023 0.1  0.0 - 0.5 10*9/L Final    Absolute Basophils 02/26/2023 0.0  0.0 - 0.1 10*9/L Final   Office Visit on 02/20/2023   Component Date Value Ref Range Status    Amphetamines Screen, Ur 02/20/2023 Negative  <500 ng/mL Final    Barbiturates Screen, Ur 02/20/2023 Negative  <200 ng/mL Final    Benzodiazepines Screen, Urine 02/20/2023 Negative  <200 ng/mL Final    Cannabinoids Screen, Ur 02/20/2023 Negative  <20 ng/mL Final    Methadone Screen, Urine 02/20/2023 Negative  <300 ng/mL Final    Cocaine(Metab.)Screen, Urine 02/20/2023 Negative  <150 ng/mL Final    Opiates Screen, Ur 02/20/2023 Negative  <300 ng/mL Final    Fentanyl Screen, Ur 02/20/2023 Negative  <1.0 ng/mL Final    Oxycodone Screen, Ur 02/20/2023 Negative  <100 ng/mL Final    Buprenorphine, Urine 02/20/2023 Negative  <5 ng/mL Final    SARS-CoV-2 PCR 02/20/2023 Not Detected  Not Detected Final    Influenza A 02/20/2023 Not Detected  Not Detected Final    Influenza B 02/20/2023 Not Detected  Not Detected Final    RSV 02/20/2023 Not Detected  Not Detected Final    Sodium 02/20/2023 142  135 - 145 mmol/L Final    Potassium 02/20/2023 3.2 (L)  3.4 - 4.8 mmol/L Final    Chloride 02/20/2023 106  98 - 107 mmol/L Final    CO2 02/20/2023 32.0 (H)  20.0 - 31.0 mmol/L Final    Anion Gap 02/20/2023 4 (L)  5 - 14 mmol/L Final    BUN 02/20/2023 9  9 - 23 mg/dL Final    Creatinine 21/30/8657 0.58  0.55 - 1.02 mg/dL Final    BUN/Creatinine Ratio 02/20/2023 16   Final    eGFR CKD-EPI (2021) Female 02/20/2023 >90  >=60 mL/min/1.80m2 Final  Glucose 02/20/2023 99  70 - 179 mg/dL Final    Calcium 16/06/9603 10.0  8.7 - 10.4 mg/dL Final    Dilute Viper Venom Time 02/20/2023 36.5  0.0 - 42.4 s Final    APTT Lupus Anticoagulant 02/20/2023 38.8  0.0 - 45.2 s Final    Anti-PL Interp 02/20/2023    Final    Beta-2 Glyco 1 IgG 02/20/2023 1.0  <20.0 Final    Beta-2 Glyco 1 IgM 02/20/2023 1.0  <20.0 Final    Anticardiolipin IgG 02/20/2023 4.0  0.0 - 23.0 [GPL'U] Final    Anticardiolipin IgM 02/20/2023 18.0 (H)  0.0 - 11.0 [MPL'U] Final   Admission on 02/15/2023, Discharged on 02/15/2023   Component Date Value Ref Range Status    EKG Ventricular Rate 02/15/2023 91  BPM Final    EKG Atrial Rate 02/15/2023 91  BPM Final    EKG P-R Interval 02/15/2023 154  ms Final    EKG QRS Duration 02/15/2023 92  ms Final    EKG Q-T Interval 02/15/2023 350  ms Final    EKG QTC Calculation 02/15/2023 430  ms Final    EKG Calculated P Axis 02/15/2023 63  degrees Final    EKG Calculated R Axis 02/15/2023 -8  degrees Final    EKG Calculated T Axis 02/15/2023 57  degrees Final    QTC Fredericia 02/15/2023 402  ms Final   Admission on 01/03/2023, Discharged on 01/07/2023   Component Date Value Ref Range Status    Glucose, POC 01/03/2023 99  70 - 179 mg/dL Final   Office Visit on 01/01/2023   Component Date Value Ref Range Status    Amphetamines Screen, Ur 01/01/2023 Negative  <500 ng/mL Final    Barbiturates Screen, Ur 01/01/2023 Negative  <200 ng/mL Final    Benzodiazepines Screen, Urine 01/01/2023 Negative  <200 ng/mL Final    Cannabinoids Screen, Ur 01/01/2023 Negative  <20 ng/mL Final    Methadone Screen, Urine 01/01/2023 Negative  <300 ng/mL Final    Cocaine(Metab.)Screen, Urine 01/01/2023 Negative  <150 ng/mL Final    Opiates Screen, Ur 01/01/2023 Negative  <300 ng/mL Final    Fentanyl Screen, Ur 01/01/2023 Negative  <1.0 ng/mL Final    Oxycodone Screen, Ur 01/01/2023 Negative  <100 ng/mL Final    Buprenorphine, Urine 01/01/2023 Negative  <5 ng/mL Final    6-Monoacetylmorphine 01/01/2023 <5  <5 ng/mL Final    Morphine 01/01/2023 <25  <25 ng/mL Final    Codeine 01/01/2023 <25  <25 ng/mL Final    Hydrocodone 01/01/2023 <25  <25 ng/mL Final    Hydromorphone 01/01/2023 <25  <25 ng/mL Final    Oxycodone 01/01/2023 <25  <25 ng/mL Final    Oxymorphone 01/01/2023 <25  <25 ng/mL Final    Buprenorphine 01/01/2023 <5  <5 ng/mL Final    Norbuprenorphine 01/01/2023 <5  <5 ng/mL Final    Fentanyl 01/01/2023 <0.5  <0.5 ng/mL Final    Norfentanyl 01/01/2023 <1.0  <1.0 ng/mL Final    Interpretation 01/01/2023 NEGATIVE   Final    Creat U 01/01/2023 71.5  Undefined mg/dL Final    Albumin Quantitative, Urine 01/01/2023 1.7  Undefined mg/dL Final    Albumin/Creatinine Ratio 01/01/2023 23.8  0.0 - 30.0 ug/mg Final    TSH 01/01/2023 3.012  0.550 - 4.780 uIU/mL Final    Hemoglobin A1C 01/01/2023 5.7 (H)  4.8 - 5.6 % Final    Estimated Average Glucose 01/01/2023 117  mg/dL Final   Office Visit on 12/16/2022  Component Date Value Ref Range Status    Albumin 12/16/2022 4.6  3.4 - 5.0 g/dL Final    Total Protein 12/16/2022 7.3  5.7 - 8.2 g/dL Final    Total Bilirubin 12/16/2022 0.4  0.3 - 1.2 mg/dL Final    Bilirubin, Direct 12/16/2022 0.10  0.00 - 0.30 mg/dL Final    AST 16/06/9603 16  <=34 U/L Final    ALT 12/16/2022 26  10 - 49 U/L Final    Alkaline Phosphatase 12/16/2022 70  46 - 116 U/L Final    Vitamin D Total (25OH) 12/16/2022 12.5 (L)  20.0 - 80.0 ng/mL Final    WBC 12/16/2022 6.6  3.6 - 11.2 10*9/L Final    RBC 12/16/2022 4.56  3.95 - 5.13 10*12/L Final    HGB 12/16/2022 13.9  11.3 - 14.9 g/dL Final    HCT 54/05/8118 41.6  34.0 - 44.0 % Final    MCV 12/16/2022 91.3  77.6 - 95.7 fL Final    MCH 12/16/2022 30.6  25.9 - 32.4 pg Final    MCHC 12/16/2022 33.5  32.0 - 36.0 g/dL Final    RDW 14/78/2956 14.1  12.2 - 15.2 % Final    MPV 12/16/2022 7.9  6.8 - 10.7 fL Final    Platelet 12/16/2022 233  150 - 450 10*9/L Final    nRBC 12/16/2022 0  <=4 /100 WBCs Final    Neutrophils % 12/16/2022 38.3  % Final    Lymphocytes % 12/16/2022 54.9  % Final    Monocytes % 12/16/2022 4.6  % Final    Eosinophils % 12/16/2022 1.3  % Final    Basophils % 12/16/2022 0.9  % Final    Absolute Neutrophils 12/16/2022 2.5  1.8 - 7.8 10*9/L Final    Absolute Lymphocytes 12/16/2022 3.7 (H)  1.1 - 3.6 10*9/L Final    Absolute Monocytes 12/16/2022 0.3  0.3 - 0.8 10*9/L Final    Absolute Eosinophils 12/16/2022 0.1  0.0 - 0.5 10*9/L Final    Absolute Basophils 12/16/2022 0.1  0.0 - 0.1 10*9/L Final   Hospital Outpatient Visit on 10/31/2022 Component Date Value Ref Range Status    Protein, CSF 10/31/2022 39  20 - 62 mg/dL Final    Tube # CSF 21/30/8657 Tube 4   Final    Color, CSF 10/31/2022 Colorless   Final    Appearance, CSF 10/31/2022 Clear   Final    Nucleated Cells, CSF 10/31/2022 1  0 - 5 ul Final    RBC, CSF 10/31/2022 2 (H)  0 ul Final    Lymphs %, CSF 10/31/2022 95.0  % Final    Mono/Macrophage %, CSF 10/31/2022 5.0  % Final    #Cells counted for Diff 10/31/2022 100   Final    Tube # CSF 10/31/2022 Tube 2   Final    Color, CSF 10/31/2022 Colorless   Final    Appearance, CSF 10/31/2022 Clear   Final    Nucleated Cells, CSF 10/31/2022 6 (H)  0 - 5 ul Final    RBC, CSF 10/31/2022 1 (H)  0 ul Final    Lymphs %, CSF 10/31/2022 95.7  % Final    Mono/Macrophage %, CSF 10/31/2022 4.3  % Final    #Cells counted for Diff 10/31/2022 69   Final    Glucose, CSF 10/31/2022 145 (H)  48 - 79 mg/dL Final    IgG, CSF 84/69/6295 6.6  <7.0 mg/dL Final    Albumin, CSF 28/41/3244 15.8  7.1 -  35.7 mg/dL Final    Special Collection 10/31/2022 Completed   Final   Appointment on 10/31/2022   Component Date Value Ref Range Status    Glucose 10/31/2022 312 (H)  70 - 179 mg/dL Final    Total IgG 16/06/9603 1,069  650 - 1,600 mg/dL Final    Albumin 54/05/8118 4.0  3.4 - 5.0 g/dL Final    Albumin, Serum 10/31/2022 4,000  3,500 - 5,000 mg/dL Final    CSF/SER Alb Ratio 10/31/2022 5.1  <9.0 Final    CSF IGG Index 10/31/2022 1.2 (H)  0.3 - 0.8 Final    CSF Oligoclonal Bands 10/31/2022    Final    Ratio Interpretation 10/31/2022    Final    Albumin 10/31/2022 4.0  3.4 - 5.0 g/dL Final    Total IgG 14/78/2956 1,069  650 - 1,600 mg/dL Final    Sendout Test Name 10/31/2022 SEE COMMENT   Final    Sendout Test Result 10/31/2022 SEE COMMENT   Final   Office Visit on 10/16/2022   Component Date Value Ref Range Status    ENA Screen 10/16/2022 <0.10  <0.70 ENA Units Final    ANCA IFA 10/16/2022 Negative  Negative Final    MPO-Elisa 10/16/2022 Negative  Negative Final    MPO-Quant 10/16/2022 1.1  <21.0 U/mL Final    PR3 Elisa 10/16/2022 Negative  Negative Final    PR3-Quant 10/16/2022 0.7  <21.0 U/mL Final    Sendout Test Name 10/16/2022 SEE COMMENT   Final    Sendout Test Result 10/16/2022 SEE COMMENT   Final    Copper 10/16/2022 110  77 - 206 mcg/dL Final    Vitamin A 21/30/8657 67.9  32.5 - 78.0 mcg/dL Final    Vitamin E 84/69/6295 13.5  5.5 - 17.0 mg/L Final    HTLV I/II Ab 10/16/2022 Negative  Negative Final    Hep B Core Total Ab 10/16/2022 Nonreactive  Nonreactive Final    Hepatitis C Ab 10/16/2022 Nonreactive  Nonreactive Final    Quantiferon TB Gold Plus Interpret* 10/16/2022 Negative  Negative Final    Quantiferon TB NIL value 10/16/2022 0.03  IU/mL Final    Quantiferon Mitogen Minus Nil 10/16/2022 9.97  IU/mL Final    Quantiferon Antigen 1 minus Nil 10/16/2022 0.02  IU/mL Final    Quantiferon Antigen 2 minus NIL 10/16/2022 0.01  IU/mL Final    TB NIL VALUE 10/16/2022 0.03   Final    TB AG1 VALUE 10/16/2022 0.05   Final    TB AG2 VALUE 10/16/2022 0.04   Final    TB Mitogen 10/16/2022 10.00   Final    Dilute Viper Venom Time 10/16/2022 42.7 (H)  0.0 - 42.4 s Final    Dilute Viper Venom Confirm 10/16/2022 36.2  s Final    DRVVT Normalized Ratio 10/16/2022 1.22 (H)  0.00 - 1.20 Final    APTT Lupus Anticoagulant 10/16/2022 43.1 (H)  0.0 - 42.8 s Final    Anti-PL Interp 10/16/2022    Final    Beta-2 Glyco 1 IgG 10/16/2022 1.0  <20.0 Final    Beta-2 Glyco 1 IgM 10/16/2022 2.0  <20.0 Final    Anticardiolipin IgG 10/16/2022 1.0  0.0 - 23.0 [GPL'U] Final    Anticardiolipin IgM 10/16/2022 14.0 (H)  0.0 - 11.0 [MPL'U] Final    Without Phospholipid 10/16/2022 58.9  s Final    With Phospholipid 10/16/2022 46.7  s Final    HPN Difference 10/16/2022 12.2 (H)  0.0 - 7.9  s Final   Clinical Support on 09/25/2022   Component Date Value Ref Range Status    Hemoglobin A1C 09/25/2022 6.3 (H)  4.8 - 5.6 % Final    Estimated Average Glucose 09/25/2022 134  mg/dL Final    Triglycerides 09/25/2022 205 (H)  0 - 150 mg/dL Final    Cholesterol 60/45/4098 152  <=200 mg/dL Final    HDL 11/91/4782 41  40 - 60 mg/dL Final    LDL Calculated 09/25/2022 70  40 - 99 mg/dL Final    VLDL Cholesterol Cal 09/25/2022 41 (H)  11 - 40 mg/dL Final    Chol/HDL Ratio 09/25/2022 3.7  1.0 - 4.5 Final    Non-HDL Cholesterol 09/25/2022 111  70 - 130 mg/dL Final    FASTING 95/62/1308 No   Final    Sodium 09/25/2022 141  135 - 145 mmol/L Final    Potassium 09/25/2022 4.1  3.4 - 4.8 mmol/L Final    Chloride 09/25/2022 107  98 - 107 mmol/L Final    CO2 09/25/2022 27.0  20.0 - 31.0 mmol/L Final    Anion Gap 09/25/2022 7  5 - 14 mmol/L Final    BUN 09/25/2022 13  9 - 23 mg/dL Final    Creatinine 65/78/4696 0.65  0.55 - 1.02 mg/dL Final    BUN/Creatinine Ratio 09/25/2022 20   Final    eGFR CKD-EPI (2021) Female 09/25/2022 >90  >=60 mL/min/1.75m2 Final    Glucose 09/25/2022 188 (H)  70 - 179 mg/dL Final    Calcium 29/52/8413 9.4  8.7 - 10.4 mg/dL Final   There may be more visits with results that are not included.       PROBLEM LIST:    Patient Active Problem List   Diagnosis    Adjustment disorder with mixed anxiety and depressed mood    Amaurosis fugax of right eye    Cocaine abuse with cocaine-induced mood disorder (CMS-HCC)    Colonic polyp    COPD (chronic obstructive pulmonary disease) (CMS-HCC)    Essential hypertension    Hallux rigidus of right foot    Hyperlipidemia    Hypothyroidism    Impaired cognition    MDD (major depressive disorder), recurrent episode, severe (CMS-HCC)    Obesity    Onychomycosis    Post traumatic stress disorder (PTSD)    Right sided weakness    Schizoaffective disorder, bipolar type (CMS-HCC)    Severe tobacco use disorder    Type 2 diabetes mellitus (CMS-HCC)    Healthcare maintenance    Knee pain    Allergic rhinitis    Ganglion cyst of dorsum of left wrist    Multiple sclerosis (CMS-HCC)         CURRENT MEDICATIONS:    Current Outpatient Medications   Medication Sig Dispense Refill    acetaminophen (TYLENOL EXTRA STRENGTH) 500 MG tablet Take 2 tablets (1,000 mg total) by mouth Three (3) times a day. 200 tablet 11    albuterol 2.5 mg /3 mL (0.083 %) nebulizer solution Inhale 3 mL (2.5 mg total) by nebulization every four (4) hours. 540 mL 0    amantadine HCL (SYMMETREL) 100 mg capsule Take 1 capsule (100 mg total) by mouth two (2) times a day. 60 capsule 5    amlodipine (NORVASC) 10 MG tablet Take 1 tablet (10 mg total) by mouth daily. 90 tablet 3    amoxicillin (AMOXIL) 500 MG capsule Take 2 capsules (1,000 mg total) by mouth Three (3) times a day for  7 days. 42 capsule 0    aspirin (ECOTRIN) 81 MG tablet Take 1 tablet (81 mg total) by mouth daily. 90 tablet 3    blood sugar diagnostic (GLUCOSE BLOOD) Strp Test blood glucose 3 times daily. 50 strip 11    blood-glucose sensor (FREESTYLE LIBRE 3 SENSOR) Devi Change 1 sensor every fourteen (14) days. 6 each 3    cetirizine (ZYRTEC) 10 MG tablet Take 1 tablet (10 mg total) by mouth daily. 90 tablet 3    chlorhexidine (PERIDEX) 0.12 % solution Swish and spit out 15 mls two (2) times a day. 2365 mL 0    cholecalciferol, vitamin D3-1,250 mcg, 50,000 unit,, 1,250 mcg (50,000 unit) capsule Take 1 capsule (1,250 mcg total) by mouth once a week. 24 capsule 0    colchicine (COLCRYS) 0.6 mg tablet Take 1 tablet (0.6 mg total) by mouth daily. 90 tablet 3    cyclobenzaprine (FLEXERIL) 5 MG tablet Take 1 tablet (5 mg total) by mouth Three (3) times a day. 90 tablet 0    diclofenac sodium (VOLTAREN) 1 % gel Apply 2 g topically four (4) times a day. 200 g 6    dimethyl fumarate 240 mg CpDR Take 1 capsule (240 mg total) by mouth two (2) times a day. 60 capsule 2    dulaglutide (TRULICITY) 1.5 mg/0.5 mL PnIj Inject 0.5 mL (1.5 mg total) under the skin every seven (7) days. 6 mL 3    DULoxetine (CYMBALTA) 60 MG capsule Take 1 capsule (60 mg total) by mouth daily. 90 capsule 3    empagliflozin (JARDIANCE) 25 mg tablet Take 1 tablet (25 mg total) by mouth daily. 90 tablet 3    ezetimibe (ZETIA) 10 mg tablet Take 1 tablet (10 mg total) by mouth daily. 90 tablet 3    fluticasone propionate (FLONASE) 50 mcg/actuation nasal spray Use 1 spray into each nostril daily. 16 g 11    ibuprofen (MOTRIN) 600 MG tablet TAKE 1 TABLET BY MOUTH EVERY 6 HOURS AS NEEDED FOR PAIN FOR UP TO 10 DAYS      insulin aspart (NOVOLOG FLEXPEN) 100 unit/mL (3 mL) injection pen Inject 0.1 mL (10 Units total) under the skin two (2) times a day. Give 10-15 minutes before a meal. 15 mL 4    ipratropium-albuterol (DUO-NEB) 0.5-2.5 mg/3 mL nebulizer Inhale 3 mL (contents of one nebule) by nebulization every six (6) hours as needed. 180 mL 3    levothyroxine (SYNTHROID) 100 MCG tablet Take 1 tablet (100 mcg total) by mouth.      lidocaine-prilocaine (EMLA) 2.5-2.5 % cream Apply topically Three (3) times a day as needed. 30 g 0    lisinopril (PRINIVIL,ZESTRIL) 20 MG tablet Take 1 tablet (20 mg total) by mouth daily. 90 tablet 3    loratadine (CLARITIN) 10 mg tablet Take 1 tablet (10 mg total) by mouth daily.      magnesium oxide (MAG-OX) 400 mg (241.3 mg elemental magnesium) tablet Take 1 tablet (400 mg total) by mouth daily. 30 tablet 11    nicotine (NICODERM CQ) 21 mg/24 hr patch Place 1 patch on the skin daily. 28 patch 2    nicotine polacrilex (NICORETTE) 4 MG gum Take 1 each (4 mg total) by mouth every hour as needed. 110 each 2    ondansetron (ZOFRAN) 4 MG tablet Take 1 tablet (4 mg total) by mouth daily as needed for nausea. 30 tablet 1    [START ON 03/11/2023] oxyCODONE (ROXICODONE) 5 MG immediate release tablet  Take 1 tablet (5 mg total) by mouth two (2) times a day as needed for pain. 60 tablet 0    pregabalin (LYRICA) 75 MG capsule Take 1 capsule (75 mg total) by mouth Three (3) times a day. 90 capsule 2    QUEtiapine (SEROQUEL) 200 MG tablet Take 1 tablet (200 mg total) by mouth nightly. 90 tablet 3    umeclidinium-vilanteroL (ANORO ELLIPTA) 62.5-25 mcg/actuation inhaler Inhale 1 puff daily. 180 each 3    varenicline (CHANTIX) 1 mg tablet       VENTOLIN HFA 90 mcg/actuation inhaler Inhale 2 puffs by mouth every six (6) hours as needed for wheezing. 18 g 5    blood-glucose meter,continuous (FREESTYLE LIBRE 3 READER) Misc Use as directed to test blood sugar 1 each 0    diazePAM (VALIUM) 2 MG tablet Take as directed. Take one tablet (2mg ) by mouth thirty minutes prior to MRI's. May repeat once if needed. 2 tablet 0    inhaler, assist devices (OPTICHAMBER, AEROCHAMBER, ADULT) Spcr Use as directed with inhalers 1 each 0    lancets Misc Test blood glucose 3 times daily. 100 each 11    naloxone (NARCAN) 4 mg nasal spray Give single spray in one nostril.  Repeat with 2nd device in other nostril every 3 min if no or minimal response until 911 arrives 2 each 11    polyethylene glycol (CLEARLAX) 17 gram/dose powder Take as directed for extended bowel prep. (Patient not taking: Reported on 02/26/2023) 238 g 0    TECHLITE PEN NEEDLE 32 gauge x 1/4 (6 mm) Ndle        No current facility-administered medications for this visit.       Past Surgical Hx:    Past Surgical History:   Procedure Laterality Date    CESAREAN SECTION      FINGER SURGERY Right     ring finger reattached    FOOT SURGERY Right        Social Hx:    Social History     Socioeconomic History    Marital status: Married     Spouse name: None    Number of children: None    Years of education: None    Highest education level: None   Tobacco Use    Smoking status: Every Day     Current packs/day: 2.00     Average packs/day: 2.0 packs/day for 37.4 years (74.7 ttl pk-yrs)     Types: Cigarettes     Start date: 10/17/1985    Smokeless tobacco: Never   Vaping Use    Vaping status: Never Used   Substance and Sexual Activity    Alcohol use: No    Drug use: Never    Sexual activity: Not Currently       Family Hx:    Family History   Problem Relation Age of Onset    Asthma Mother     Cancer Son     Cancer Other     Breast cancer Neg Hx        ALLERGIES:    Allergies   Allergen Reactions    Grass Pollen-[place]grass, Standard

## 2023-02-26 NOTE — Unmapped (Addendum)
Below is your visit summary:    **Finish the weekly 50,000 units  and then Take Vitamin D3 4,000 units daily ( over the counter) to prevent:   -Worsening symptoms of multiple sclerosis (MS), flare-ups, slow disease progression and developing new brain or spinal cord lesions.     For fatigue:  Amantadine 100 - 200 mg (1-2 tablets/ capsules) one to two times a day as needed. Take the first dose early in the day and the second dose between noon and 2 pm. Side effect: It can keep you awake at night.   Schedule your MRI's 06/2023.    Follow up 4-5 months    Thank you for choosing [facility]  We appreciate the opportunity to participate in your care. If you have questions or concerns, please do not hesitate to contact us by [facility] or by calling [phone] to speak with one of our clinical support team members.      [facility] MyChart Website: [email or web address]      Appointment Scheduling: [phone]    Symir Mah Jannett Celestine PA-C, MPAS  Division of Multiple Sclerosis  [facility]  [address]  [place], [place] [address]    Phone [phone]  Fax [phone]    Navigating the Challenges of MS: MS Navigator??   National MS Society: MS Navigator at [phone]  Finding answers and making decisions relies on having the right information at the right time. MS Navigator are highly skilled, compassionate professionals available Monday through Friday, 9:00 a.m. to 7:00 p.m. ET to connect you to the information, resources and support needed to move your life forward. These supportive partners help navigate the challenges of MS unique to your situation.    [facility] is a specialty clinic and there is a need for you to have a  primary care provider  who will take care of your non-neurological health.   Please set this up if you have not already done so.      In case of:  a suspected relapse (new symptoms or worsening existing symptoms, lasting for >24h)  OR  a need for an additional appointment for other reasons  OR   if you have other questions: please contact:       [facility] Desk   Phone: [phone]      Supportive Therapy, Patriciaann Clan, MSW, LCSW.        Phone [phone]    If you have questions for our  pharmacist, please call:      Worthy Flank, PharmD, CPP  Phone: [phone]      If you need financial assistance, please call:      Financial Assistance Unit      Phone: [phone]

## 2023-02-26 NOTE — Unmapped (Signed)
Addended by: Annette Stable on: 02/26/2023 02:06 PM     Modules accepted: Orders

## 2023-02-28 MED FILL — VENTOLIN HFA 90 MCG/ACTUATION AEROSOL INHALER: RESPIRATORY_TRACT | 25 days supply | Qty: 18 | Fill #0

## 2023-03-03 MED ORDER — TRELEGY ELLIPTA 100 MCG-62.5 MCG-25 MCG POWDER FOR INHALATION
Freq: Every day | RESPIRATORY_TRACT | 3 refills | 180 days
Start: 2023-03-03 — End: ?

## 2023-03-03 MED ORDER — TRULICITY 4.5 MG/0.5 ML SUBCUTANEOUS PEN INJECTOR
SUBCUTANEOUS | 3 refills | 84 days
Start: 2023-03-03 — End: ?

## 2023-03-03 MED FILL — ACCU-CHEK GUIDE TEST STRIPS: 17 days supply | Qty: 50 | Fill #1

## 2023-03-03 MED FILL — CHLORHEXIDINE GLUCONATE 0.12 % MOUTHWASH: OROMUCOSAL | 16 days supply | Qty: 473 | Fill #1

## 2023-03-03 MED FILL — ACETAMINOPHEN 500 MG TABLET: ORAL | 34 days supply | Qty: 200 | Fill #2

## 2023-03-03 MED FILL — TRULICITY 1.5 MG/0.5 ML SUBCUTANEOUS PEN INJECTOR: SUBCUTANEOUS | 28 days supply | Qty: 2 | Fill #0

## 2023-03-03 MED FILL — ACCU-CHEK FASTCLIX LANCET DRUM: 34 days supply | Qty: 102 | Fill #1

## 2023-03-03 NOTE — Unmapped (Incomplete)
Medications for quitting smoking:  Varenacline (Chantix) 1 tablet (1 mg) twice daily  Nicotine patch and gum    COPD medications:  STOP Anoro Ellipta  START Trelegy Ellipta 1 puff once daily  Albuterol 1-2 puffs every 4-6 hours as needed for shortness of breath or wheezing    Blood pressure medications:  Lisinopril 1 tablet (20 mg) daily  Amlodipine 1 tablet (10 mg) daily    Diabetes medications:  Trulicity 4.5 mg injection once weekly on Sundays - refill for 4.5 mg pens should be coming from [facility] pharmacy  Lantus 20 units once daily    How often and when to check blood sugar:  Use Freestyle Libre 3 CGM as instructed. Change the sensors every 14 days.    How to treat low blood sugar:  For blood sugar less than 70 --> Treat with 4 ounces of juice or regular soda, or with 3 to 4 glucose tablets. Re-check blood sugar in 15 minutes.    If blood sugar is still less than 70 on re-check, treat again and re-check in 15 minutes.   If blood sugar is over 70 on re-check and it is time to eat your regular meal, eat regular meal and take insulin as prescribed for that blood sugar.    When to follow-up:  Tuesday, July 2nd at 10:20am with Dr. Lamar Blinks  *** with the pharmacist      [name], PharmD, CPP, [facility]  Family Medicine Clinical Pharmacist  Clinic Phone: [phone]

## 2023-03-03 NOTE — Unmapped (Unsigned)
Subjective     Reason for visit:    Tonya Wood is a 57 y.o. female with a history of diabetes (type 2), COPD, HTN, schizoaffective disorder, tobacco use, and MS who presents today for a pharmacotherapy visit. Patient presents to this visit alone.    Known DM Complications: no known complications    Date of Last Diabetes Related Visit: 01/16/23 with CPP, 01/21/23 + 02/20/23 with PCP    Action At Last Diabetes Related Visit:    Issue with pharmacy not filling Libre 3 reader before time of CPP visit so was later confirmed to be covered by insurance and brought to 5/7 PCP visit - reviewed CGM application and use at that time  Continue Trulicity 4.5 mg SubQ weekly - patient later reported being unable to get from pharmacy due to drug shortages so was Rx'd 1.5 mg pens to use in the meantime  Decrease Lantus from 56 to 46 units daily (5/2 CPP visit) > decrease further to 20 units daily per PCP on 6/6  Stop Novolog due to suspected frequent hypoglycemia episodes  Continue Anoro Ellipta 1 puff daily  Suspected COPD exacerbation w/ green/yellow sputum production at PCP visit on 6/6 so was prescribed 7-day course of Augmentin  Continue Chantix 1 mg BID  Continue nicotine 21 mg/24h patch + 4 mg gum PRN  Continue ezetimibe 10 mg daily (statin previously stopped due to c/f contributing to CK elevation)    Since Last visit / History of Present Illness:    Patient reports fully implementing plan from last visit. ***    Questions/notes for today:  - ED 6/1 for URI sx - pt refused bloodwork or IVF or nebs, CXR no acute abnormalities, d/c'd w/ Rx's for prednisone + azithro for suspected COPD exacerbation  - Per 6/11 phone note pt ultimately did not want to pursue [facility]  - 6/11 PCP sent Rx for fluconazole per pt reported sx of yeast infection (suspected 2/2 recent antibiotic course)  - 6/12 neuro f/u for MS - safety labs since starting DMF in 12/2022, plan for repeat MRIs in 06/2023    - Review CGM data?  - S/sx high or low BG?  - Check w/ [facility] if Trulicity 4.5 mg pens back in stock and send updated Rx?  - Confirm Lantus dose (+ Novolog stopped)?  - Review treatment of lows?  - Continue to taper insulin as indicated?  - Addition of SGLT2i to help further taper insulin - caution w/ recent yeast infection??    - DOE/SOB? mMRC?  - Escalate Anoro to Trelegy 100 mcg given recent exacerbation??  - Smoking cessation?    Reported DM Regimen:  Lantus 20 units daily (gray pen)  Trulicity 4.5 mg weekly (Sundays)    DM medications tried in the past:   Metformin - pt doesn't like it so is not willing to take  Novolog - stopped due to increase in hypogycemia episodes    Medication Adherence and Access:  Since last visit, patient denies missing doses of medications recently, although has had intermittent non-adherence in the recent past with periods of severe pain.    Pt previously had [place] PAP but now has active Medicaid.    SMBG  per CGM reports :  Pt now using Libre 3 CGM as instructed. Feels glucose is unchanged from last visit.    14-day CGM report:  Average: ***  GMI: ***  Very high: ***%  High: ***%  In range: ***%  Low: ***%  Very  low: ***%    Hypoglycemia:    Symptoms of hypoglycemia since last visit: yes  If yes, it was treated by:  unknown  ***    DM-Related Prevention:  Statin: Not taking (stopped statin due to concern for causing elevated CK);  previously on atorvastatin 40 mg (high intensity) , now on ezetimibe 10 mg daily  Aspirin: unclear if indicated (pt reports history of CVA that occurred prior to seeking care at [facility] so not in recent health records); Taking    ACEI/ARB: Taking (lisinopril 20 mg); Urine MA/CR Ratio - normal (last checked 01/01/23).  Last eye exam: 04/24/20 - DUE  Last foot exam: 01/01/23  Tobacco Use: Current smoker  Immunizations:   Immunization History   Administered Date(s) Administered    COVID-19 VAC,BIVALENT(39YR UP),PFIZER 08/08/2021    COVID-19 VAC,MRNA,TRIS(12Y UP)(PFIZER)(GRAY CAP) 01/28/2020, 02/18/2020    COVID-19 VACC,MRNA,(PFIZER)(PF) 01/28/2020, 02/18/2020, 08/21/2020    Covid-19 Vac, (59yr+) (Spikevax) Monovalent Xbb.1.5 Moder  08/01/2022    Covid-19 Vacc, Unspecified 01/28/2020, 02/18/2020, 08/21/2020    Hep A / Hep B 09/27/2003, 10/28/2003, 10/17/2006    INFLUENZA TIV (TRI) 80MO+ W/ PRESERV (IM) 02/25/2008, 07/11/2010    INFLUENZA TIV (TRI) PF (IM) 07/01/2013    Influenza Vaccine Quad(IM)6 MO-Adult(PF) 06/09/2015, 01/08/2017, 07/22/2018, 06/30/2020, 07/17/2021, 05/21/2022    Influenza Virus Vaccine, unspecified formulation 06/30/2020    PPD Test 07/06/2014, 07/15/2014, 05/22/2016    Pneumococcal Conjugate 20-valent 12/28/2021    TdaP 12/19/2011, 05/05/2019     _________________________________________________    Past Medical History: reviewed PMH in epic today    Social History:  Social History     Tobacco Use   Smoking Status Every Day    Current packs/day: 2.00    Average packs/day: 2.0 packs/day for 37.4 years (74.7 ttl pk-yrs)    Types: Cigarettes    Start date: 10/17/1985   Smokeless Tobacco Never       Medications: Medications reviewed in EPIC medication station and updated today by the clinical pharmacist practitioner.     Objective   Review of Systems: ***  Constitutional:  No fever, chills or unintentional weight loss  Cardiovascular:  No chest pain or pressure, shortness of breath, orthopnea or LE edema  Pulmonary:  No cough or SOB  GI:  No constipation, diarrhea, dyspepsia, change in bowel habits, nausea, abdominal pain  Endocrine: No polyuria, polyphagia, blurred vision    Physical Examination:  Vitals:    There were no vitals filed for this visit. ***    Wt Readings from Last 3 Encounters:   02/26/23 92.5 kg (204 lb)   02/20/23 94 kg (207 lb 3.2 oz)   01/21/23 95.9 kg (211 lb 6.4 oz)       There is no height or weight on file to calculate BMI.    The 10-year ASCVD risk score (Arnett DK, et al., 2019) is: 18.9%    Values used to calculate the score:      Age: 37 years      Sex: Female      Is Non-Hispanic African American: Yes      Diabetic: Yes      Tobacco smoker: Yes      Systolic Blood Pressure: 118 mmHg      Is BP treated: Yes      HDL Cholesterol: 41 mg/dL      Total Cholesterol: 152 mg/dL    Note: For patients with SBP <90 or >200, Total Cholesterol <130 or >320, HDL <20 or >100 which are outside of  the allowable range, the calculator will use these upper or lower values to calculate the patient???s risk score.    mMRC: ***  Most recent MMRC dyspnea scale: 3 Last MMRC date: 11/20/2022    Labs:   Lab Results   Component Value Date    A1C 5.7 (H) 01/01/2023    A1C 6.3 (H) 09/25/2022     Lab Results   Component Value Date    CHOL 152 09/25/2022    TRIG 205 (H) 09/25/2022    HDL 41 09/25/2022    LDL 70 09/25/2022           Assessment/Plan:    1. Diabetes, type 2: controlled per last A1c of 5.7% (01/01/23), which is further decreased from 6.3% in Jan 2024. Goal <7% per ADA guidelines. Per review of CGM data, time in range is at goal of >70% and less frequent lows that suspected previously, although this is in the setting of aggressive tapering of insulin and pt being on lower dose of Trulicity due to ongoing drug shortages. Confirmed with [facility] pharmacy they can now fill max dose of Trulicity again so will return to previous dose and maintain close follow-up for monitoring of hypoglycemia frequency and need to further adjust insulin regimen in the future.  Increase Trulicity to 4.5 mg SubQ once weekly - confirmed [facility] pharmacy can refill  Continue Lantus 20 units daily  Repeat A1c due 04/02/23  Reviewed symptoms and treatment of hypoglycemia, including need to check BG prior to and following treatment.  SMBG instructions:  use Libre 3 CGM as instructed (change sensors every 14 days)  Future considerations:  Taper insulin as indicated  SGLT2 inhibitor - may help to taper insulin further in future, caution with recent yeast infection (though likely due to being on 7-day course of Augmentin)    2. Mild COPD (FEV1 >80% on PFTs 04/2022): mMRC assessment demonstrates worsening dyspnea based on score of 4, up from 3 in March 2024. However, pt likely not back to baseline since recent COPD exacerbation. Given ongoing dyspnea + recent exacerbation, escalation of inhaler regimen warranted to triple therapy with LABA/LAMA + ICS per the 2024 GOLD guidelines. Already using Ellipta device so will escalate to triple therapy combo Ellipta inhaler to minimize impact on medication complexity. Also continuing to work on smoking cessation as below.  Stop Anoro Ellipta  Start Trelegy Ellipta 1 puff once daily  Continue albuterol HFA inhaler (w/ spacer) q4-6h PRN  Continue ipratropium-albuterol nebulized solution q4-6h PRN  Sent order for new nebulizer device to [facility] supplier    3. Smoking cessation: pt reports reducing # of cigarettes per day but continuing to work towards complete cessation. Using NRT patch + gum which are helping as well as Chantix. Will continue to hold off on stepping down nicotine patch dose since pt still smoking occasionally, but will reconsider once she is ready to quit smoking cigarettes altogether.  Continue Chantix 1 mg BID  Continue nicotine 21 mg/24h patch daily + 4 mg gum PRN  Consider stepping down patch to 14 mg/24h dose as able in the future    4. ASCVD Prevention: moderate to high risk based on 10-year ASCVD risk score. No longer on high intensity statin due to some concern it was contributing to elevated CK levels, but on ezetimibe since June 2023. Last lipid panel with LDL 70 (09/25/22) and improved from 123 in Oct 2023. Goal LDL <70 per 2022 ACC ECDP given DM + ASCVD risk >7.5%.  Since LDL essentially at goal, will not pursue any additional non-statin LDL lowering therapies at this time.  Continue ezetimibe 10 mg daily    5. Hypertension: controlled based on last clinic BP of ***. Goal <130/80 per ADA guidelines. No further med changes warranted at this time.  Continue amlodipine 10 mg daily  Continue lisinopril 20 mg daily  Future considerations:  Titrate lisinopril to max dose of 40 mg as indicated  May consider addition of thiazide diuretic if additional BP lowering needed on max doses of CCB + ACEi    Follow-up: ~2 weeks with PCP as scheduled and ~2 months with CPP    Future Appointments   Date Time Provider Department Center   03/04/2023 10:00 AM [facility] CD CLINIC [facility] [place] [facility]   03/05/2023 11:30 AM Melba Coon, CPP [facility]   03/12/2023  2:30 PM Speights, Lestine Mount, SLP SPEECH [facility] [facility]   03/17/2023  1:15 PM Barron Alvine, PT [facility] [facility]   03/18/2023 10:20 AM Hite, Ileene Rubens, MD FMAY[facility] [facility]   03/27/2023  8:45 AM [facility] PROVIDER [facility] [place] [facility]   04/17/2023  9:40 AM Payton Mccallum, MD [id number] [facility]   06/02/2023  9:30 AM Mellody Life, Nilda Simmer, AGNP [facility] [place] [facility]   07/01/2023  8:00 AM HBR MRI RM 2 [facility] [place] [facility]   07/18/2023  9:30 AM Zelasky, Trixie Deis, PA [facility] [facility]       I spent a total of 30 minutes face to face with the patient delivering clinical care and providing education/counseling.    _________________________________________________    [name], PharmD, CPP, [facility]  Family Medicine Clinical Pharmacist

## 2023-03-05 ENCOUNTER — Ambulatory Visit: Admit: 2023-03-05 | Payer: PRIVATE HEALTH INSURANCE | Attending: Ambulatory Care | Primary: Ambulatory Care

## 2023-03-11 ENCOUNTER — Encounter: Admit: 2023-03-11 | Discharge: 2023-03-11 | Payer: PRIVATE HEALTH INSURANCE

## 2023-03-11 DIAGNOSIS — M545 Chronic bilateral low back pain without sciatica: Principal | ICD-10-CM

## 2023-03-11 DIAGNOSIS — M2550 Pain in unspecified joint: Principal | ICD-10-CM

## 2023-03-11 DIAGNOSIS — G8929 Other chronic pain: Principal | ICD-10-CM

## 2023-03-11 MED ORDER — PREGABALIN 75 MG CAPSULE
ORAL_CAPSULE | Freq: Three times a day (TID) | ORAL | 2 refills | 30 days
Start: 2023-03-11 — End: 2024-03-10

## 2023-03-11 MED ORDER — OXYCODONE 5 MG TABLET
ORAL_TABLET | Freq: Two times a day (BID) | ORAL | 0 refills | 30 days | Status: CP | PRN
Start: 2023-03-11 — End: 2023-04-10

## 2023-03-11 MED FILL — DICLOFENAC 1 % TOPICAL GEL: TOPICAL | 25 days supply | Qty: 200 | Fill #1

## 2023-03-11 NOTE — Unmapped (Signed)
[facility] Refill Coordination Note    Specialty Medication(s) to be Shipped:   Neurology: Dimethyl fumarate     Other medication(s) to be shipped:  Pregabalin  ,  Amantadine and   Ventolin       Tonya Wood, DOB: [birth year]  Phone: [phone] (home) [phone] (work)      All above HIPAA information was verified with patient.     Was a Nurse, learning disability used for this call? No    Completed refill call assessment today to schedule patient's medication shipment from the [facility] [phone]).  All relevant notes have been reviewed.     Specialty medication(s) and dose(s) confirmed: Regimen is correct and unchanged.   Changes to medications: Len reports no changes at this time.  Changes to insurance: No  New side effects reported not previously addressed with a pharmacist or physician: None reported  Questions for the pharmacist: No    Confirmed patient received a Conservation officer, historic buildings and a Surveyor, mining with first shipment. The patient will receive a drug information handout for each medication shipped and additional FDA Medication Guides as required.       DISEASE/MEDICATION-SPECIFIC INFORMATION        N/A    SPECIALTY MEDICATION ADHERENCE     Medication Adherence    Patient reported X missed doses in the last month: 0  Specialty Medication: dimethyl fumarate 240 mg  Patient is on additional specialty medications: No  Patient is on more than two specialty medications: No  Any gaps in refill history greater than 2 weeks in the last 3 months: no  Demonstrates understanding of importance of adherence: yes              Were doses missed due to medication being on hold? No         dimethyl fumarate 240 mg Cpdr: 7-10  days of medicine on hand        REFERRAL TO PHARMACIST     Referral to the pharmacist: Not needed      [facility]     Shipping address confirmed in Epic.       Delivery Scheduled: Yes, Expected medication delivery date: 03/18/23 .     Medication will be delivered via Same Day Courier to the prescription address in Epic WAM.    Ricci Barker   [facility] Specialty Technician

## 2023-03-12 ENCOUNTER — Ambulatory Visit: Admit: 2023-03-12 | Payer: PRIVATE HEALTH INSURANCE | Attending: Ambulatory Care | Primary: Ambulatory Care

## 2023-03-12 ENCOUNTER — Ambulatory Visit: Admit: 2023-03-12 | Payer: PRIVATE HEALTH INSURANCE

## 2023-03-12 MED ORDER — PREGABALIN 75 MG CAPSULE
ORAL_CAPSULE | Freq: Three times a day (TID) | ORAL | 3 refills | 90 days | Status: CP
Start: 2023-03-12 — End: 2024-03-11

## 2023-03-12 NOTE — Unmapped (Signed)
[facility]  [address] [place], [place] [address]     [phone]     Tonya Wood did not show for her scheduled Speech Therapy Evaluation. Please contact me if you have any questions or concerns.

## 2023-03-12 NOTE — Unmapped (Unsigned)
Subjective     Reason for visit:    Tonya Wood is a 57 y.o. female with a history of diabetes (type 2), COPD, HTN, schizoaffective disorder, tobacco use, and MS who presents today for a pharmacotherapy visit. Patient presents to this visit alone.    Known DM Complications: no known complications    Date of Last Diabetes Related Visit: 01/16/23 with CPP, 01/21/23 + 02/20/23 with PCP    Action At Last Diabetes Related Visit:    Issue with pharmacy not filling Libre 3 reader before time of CPP visit so was later confirmed to be covered by insurance and brought to 5/7 PCP visit - reviewed CGM application and use at that time  Continue Trulicity 4.5 mg SubQ weekly - patient later reported being unable to get from pharmacy due to drug shortages so was Rx'd 1.5 mg pens to use in the meantime  Decrease Lantus from 56 to 46 units daily (5/2 CPP visit) > decrease further to 20 units daily per PCP on 6/6  Stop Novolog due to suspected frequent hypoglycemia episodes  Continue Anoro Ellipta 1 puff daily  Suspected COPD exacerbation w/ green/yellow sputum production at PCP visit on 6/6 so was prescribed 7-day course of Augmentin  Continue Chantix 1 mg BID  Continue nicotine 21 mg/24h patch + 4 mg gum PRN  Continue ezetimibe 10 mg daily (statin previously stopped due to c/f contributing to CK elevation)    Since Last visit / History of Present Illness:    Pt presented to ED on 02/15/23 for URI symptoms and was discharged with prednisone + azithromycin for suspected COPD exacerbation. Later reported symptoms of yeast infection to PCP on 02/25/23 for which fluconazole was prescribed.    Patient reports fully implementing plan from last visit. ***    Questions/notes for today:  - ED 6/1 for URI sx - pt refused bloodwork or IVF or nebs, CXR no acute abnormalities, d/c'd w/ Rx's for prednisone + azithro for suspected COPD exacerbation  - Per 6/11 phone note pt ultimately did not want to pursue [facility]  - 6/11 PCP sent Rx for fluconazole per pt reported sx of yeast infection (suspected 2/2 recent antibiotic course)  - 6/12 neuro f/u for MS - safety labs since starting DMF in 12/2022, plan for repeat MRIs in 06/2023    - Review CGM data?  - S/sx high or low BG?  - Check w/ [facility] if Trulicity 4.5 mg pens back in stock and send updated Rx?  - Confirm Lantus dose (+ Novolog stopped)?  - Review treatment of lows?  - Continue to taper insulin as indicated?  - Addition of SGLT2i to help further taper insulin - caution w/ recent yeast infection??    - DOE/SOB? mMRC?  - Escalate Anoro to Trelegy 100 mcg given recent exacerbation??  - Smoking cessation?    Reported DM Regimen:  Lantus 20 units daily (gray pen)  Trulicity 4.5 mg weekly (Sundays)    DM medications tried in the past:   Metformin - pt doesn't like it so is not willing to take  Novolog - stopped due to increase in hypogycemia episodes    Medication Adherence and Access:  Since last visit, patient denies missing doses of medications recently, although has had intermittent non-adherence in the recent past with periods of severe pain.    Pt previously had  PAP but now has active Medicaid.    SMBG  per CGM reports :  Pt now using [place] 3  CGM as instructed. Feels glucose is unchanged from last visit.    14-day CGM report:  Average: ***  GMI: ***  Very high: ***%  High: ***%  In range: ***%  Low: ***%  Very low: ***%    Hypoglycemia:    Symptoms of hypoglycemia since last visit: yes  If yes, it was treated by:  unknown  ***    DM-Related Prevention:  Statin: Not taking (stopped statin due to concern for causing elevated CK);  previously on atorvastatin 40 mg (high intensity) , now on ezetimibe 10 mg daily  Aspirin: unclear if indicated (pt reports history of CVA that occurred prior to seeking care at [facility] so not in recent health records); Taking    ACEI/ARB: Taking (lisinopril 20 mg); Urine MA/CR Ratio - normal (last checked 01/01/23).  Last eye exam: 04/24/20 - DUE  Last foot exam: 01/01/23  Tobacco Use: Current smoker  Immunizations:   Immunization History   Administered Date(s) Administered    COVID-19 VAC,BIVALENT(21YR UP),PFIZER 08/08/2021    COVID-19 VAC,MRNA,TRIS(12Y UP)(PFIZER)(GRAY CAP) 01/28/2020, 02/18/2020    COVID-19 VACC,MRNA,(PFIZER)(PF) 01/28/2020, 02/18/2020, 08/21/2020    Covid-19 Vac, (61yr+) (Spikevax) Monovalent Xbb.1.5 Moder  08/01/2022    Covid-19 Vacc, Unspecified 01/28/2020, 02/18/2020, 08/21/2020    Hep A / Hep B 09/27/2003, 10/28/2003, 10/17/2006    INFLUENZA TIV (TRI) 36MO+ W/ PRESERV (IM) 02/25/2008, 07/11/2010    INFLUENZA TIV (TRI) PF (IM) 07/01/2013    Influenza Vaccine Quad(IM)6 MO-Adult(PF) 06/09/2015, 01/08/2017, 07/22/2018, 06/30/2020, 07/17/2021, 05/21/2022    Influenza Virus Vaccine, unspecified formulation 06/30/2020    PPD Test 07/06/2014, 07/15/2014, 05/22/2016    Pneumococcal Conjugate 20-valent 12/28/2021    TdaP 12/19/2011, 05/05/2019     _________________________________________________    Past Medical History: reviewed PMH in epic today    Social History:  Social History     Tobacco Use   Smoking Status Every Day    Current packs/day: 2.00    Average packs/day: 2.0 packs/day for 37.4 years (74.8 ttl pk-yrs)    Types: Cigarettes    Start date: 10/17/1985   Smokeless Tobacco Never       Medications: Medications reviewed in EPIC medication station and updated today by the clinical pharmacist practitioner.     Objective   Review of Systems: ***  Constitutional:  No fever, chills or unintentional weight loss  Cardiovascular:  No chest pain or pressure, shortness of breath, orthopnea or LE edema  Pulmonary:  No cough or SOB  GI:  No constipation, diarrhea, dyspepsia, change in bowel habits, nausea, abdominal pain  Endocrine: No polyuria, polyphagia, blurred vision    Physical Examination:  Vitals:    There were no vitals filed for this visit. ***    Wt Readings from Last 3 Encounters:   02/26/23 92.5 kg (204 lb)   02/20/23 94 kg (207 lb 3.2 oz)   01/21/23 95.9 kg (211 lb 6.4 oz) There is no height or weight on file to calculate BMI.    The 10-year ASCVD risk score (Arnett DK, et al., 2019) is: 18.9%    Values used to calculate the score:      Age: 86 years      Sex: Female      Is Non-Hispanic African American: Yes      Diabetic: Yes      Tobacco smoker: Yes      Systolic Blood Pressure: 118 mmHg      Is BP treated: Yes      HDL Cholesterol: 41 mg/dL  Total Cholesterol: 152 mg/dL    Note: For patients with SBP <90 or >200, Total Cholesterol <130 or >320, HDL <20 or >100 which are outside of the allowable range, the calculator will use these upper or lower values to calculate the patient???s risk score.    mMRC: ***  Most recent MMRC dyspnea scale: 3 Last MMRC date: 11/20/2022    Labs:   Lab Results   Component Value Date    A1C 5.7 (H) 01/01/2023    A1C 6.3 (H) 09/25/2022     Lab Results   Component Value Date    CHOL 152 09/25/2022    TRIG 205 (H) 09/25/2022    HDL 41 09/25/2022    LDL 70 09/25/2022           Assessment/Plan:    1. Diabetes, type 2: controlled per last A1c of 5.7% (01/01/23), which is further decreased from 6.3% in Jan 2024. Goal <7% per ADA guidelines. Per review of CGM data, time in range is at goal of >70% and less frequent lows that suspected previously, although this is in the setting of aggressive tapering of insulin and pt being on lower dose of Trulicity due to ongoing drug shortages. Confirmed with [facility] pharmacy they can now fill max dose of Trulicity again so will return to previous dose and maintain close follow-up for monitoring of hypoglycemia frequency and need to further adjust insulin regimen in the future.  Increase Trulicity to 4.5 mg SubQ once weekly - confirmed [facility] pharmacy can refill  Continue Lantus 20 units daily  Repeat A1c due 04/02/23  Reviewed symptoms and treatment of hypoglycemia, including need to check BG prior to and following treatment.  SMBG instructions:  use Libre 3 CGM as instructed (change sensors every 14 days)  Future considerations:  Taper insulin as indicated  SGLT2 inhibitor - may help to taper insulin further in future, caution with recent yeast infection (though likely due to being on 7-day course of Augmentin)    2. Mild COPD (FEV1 >80% on PFTs 04/2022): mMRC assessment demonstrates worsening dyspnea based on score of 4, up from 3 in March 2024. However, pt likely not back to baseline since recent COPD exacerbation. Given ongoing dyspnea + recent exacerbation, escalation of inhaler regimen warranted to triple therapy with LABA/LAMA + ICS per the 2024 GOLD guidelines. Already using Ellipta device so will escalate to triple therapy combo Ellipta inhaler to minimize impact on medication complexity. Also continuing to work on smoking cessation as below.  Stop Anoro Ellipta  Start Trelegy Ellipta 1 puff once daily  Continue albuterol HFA inhaler (w/ spacer) q4-6h PRN  Continue ipratropium-albuterol nebulized solution q4-6h PRN  Sent order for new nebulizer device to [facility] [facility] supplier    3. Smoking cessation: pt reports reducing # of cigarettes per day but continuing to work towards complete cessation. Using NRT patch + gum which are helping as well as Chantix. Will continue to hold off on stepping down nicotine patch dose since pt still smoking occasionally, but will reconsider once she is ready to quit smoking cigarettes altogether.  Continue Chantix 1 mg BID  Continue nicotine 21 mg/24h patch daily + 4 mg gum PRN  Consider stepping down patch to 14 mg/24h dose as able in the future    4. ASCVD Prevention: moderate to high risk based on 10-year ASCVD risk score. No longer on high intensity statin due to some concern it was contributing to elevated CK levels, but on ezetimibe since June  2023. Last lipid panel with LDL 70 (09/25/22) and improved from 123 in Oct 2023. Goal LDL <70 per 2022 ACC ECDP given DM + ASCVD risk >7.5%. Since LDL essentially at goal, will not pursue any additional non-statin LDL lowering therapies at this time.  Continue ezetimibe 10 mg daily    5. Hypertension: controlled based on last clinic BP of ***. Goal <130/80 per ADA guidelines. No further med changes warranted at this time.  Continue amlodipine 10 mg daily  Continue lisinopril 20 mg daily  Future considerations:  Titrate lisinopril to max dose of 40 mg as indicated  May consider addition of thiazide diuretic if additional BP lowering needed on max doses of CCB + ACEi    Follow-up: ~2 weeks with PCP as scheduled and ~2 months with CPP    Future Appointments   Date Time Provider Department Center   03/12/2023 11:30 AM Melba Coon, CPP [facility]   03/12/2023  2:30 PM Speights, Lestine Mount, SLP SPEECH [facility]   03/17/2023  1:15 PM Barron Alvine, PT [facility]   03/18/2023 10:20 AM Hite, Ileene Rubens, MD FMAY[facility]   03/27/2023  8:45 AM [facility] PROVIDER [facility]   04/17/2023  9:40 AM Payton Mccallum, MD [id number]   06/02/2023  9:30 AM Mellody Life, Nilda Simmer, AGNP [facility]   07/01/2023  8:00 AM [facility] MRI RM 2 [facility] [place] [facility]   07/18/2023  9:30 AM Zelasky, Trixie Deis, PA [facility]       I spent a total of 30 minutes face to face with the patient delivering clinical care and providing education/counseling.    _________________________________________________    [name], PharmD, CPP, [facility]  Family Medicine Clinical Pharmacist

## 2023-03-12 NOTE — Unmapped (Incomplete)
Medications for quitting smoking:  Varenacline (Chantix) 1 tablet (1 mg) twice daily  Nicotine patch and gum    COPD medications:  STOP Anoro Ellipta  START Trelegy Ellipta 1 puff once daily  Albuterol 1-2 puffs every 4-6 hours as needed for shortness of breath or wheezing    Blood pressure medications:  Lisinopril 1 tablet (20 mg) daily  Amlodipine 1 tablet (10 mg) daily    Diabetes medications:  Trulicity 4.5 mg injection once weekly on Sundays - refill for 4.5 mg pens [facility]  Lantus 20 units once daily    How often and when to check blood sugar:  Use Freestyle Libre 3 CGM as instructed. Change the sensors every 14 days.    How to treat low blood sugar:  For blood sugar less than 70 --> Treat with 4 ounces of juice or regular soda, or with 3 to 4 glucose tablets. Re-check blood sugar in 15 minutes.    If blood sugar is still less than 70 on re-check, treat again and re-check in 15 minutes.   If blood sugar is over 70 on re-check and it is time to eat your regular meal, eat regular meal and take insulin as prescribed for that blood sugar.    When to follow-up:  Tuesday, July 2nd at 10:20am with Dr. Hite  *** with the pharmacist      Stephano Arrants, PharmD, CPP, BCACP  Family Medicine Clinical Pharmacist  Clinic Phone: [phone]

## 2023-03-14 DIAGNOSIS — G8929 Other chronic pain: Principal | ICD-10-CM

## 2023-03-14 DIAGNOSIS — E1165 Type 2 diabetes mellitus with hyperglycemia: Principal | ICD-10-CM

## 2023-03-14 DIAGNOSIS — R52 Pain, unspecified: Principal | ICD-10-CM

## 2023-03-14 DIAGNOSIS — M545 Chronic bilateral low back pain without sciatica: Principal | ICD-10-CM

## 2023-03-14 DIAGNOSIS — E1159 Type 2 diabetes mellitus with other circulatory complications: Principal | ICD-10-CM

## 2023-03-14 DIAGNOSIS — M2550 Pain in unspecified joint: Principal | ICD-10-CM

## 2023-03-14 DIAGNOSIS — Z794 Long term (current) use of insulin: Principal | ICD-10-CM

## 2023-03-14 MED ORDER — EZETIMIBE 10 MG TABLET
ORAL_TABLET | Freq: Every day | ORAL | 3 refills | 90 days
Start: 2023-03-14 — End: 2024-03-13

## 2023-03-14 MED ORDER — PEN NEEDLE, DIABETIC 31 GAUGE X 5/16" (8 MM)
11 refills | 0 days | Status: CP
Start: 2023-03-14 — End: 2024-03-13
  Filled 2023-03-15: qty 100, 100d supply, fill #0

## 2023-03-14 MED ORDER — CYCLOBENZAPRINE 5 MG TABLET
ORAL_TABLET | Freq: Three times a day (TID) | ORAL | 0 refills | 30 days
Start: 2023-03-14 — End: 2023-04-13

## 2023-03-14 MED ORDER — LANTUS SOLOSTAR U-100 INSULIN 100 UNIT/ML (3 ML) SUBCUTANEOUS PEN
Freq: Every evening | SUBCUTANEOUS | 0 refills | 75 days | Status: CP
Start: 2023-03-14 — End: ?
  Filled 2023-03-15: qty 15, 75d supply, fill #0
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
## 2023-03-15 MED ORDER — EZETIMIBE 10 MG TABLET
ORAL_TABLET | Freq: Every day | ORAL | 3 refills | 90 days | Status: CP
Start: 2023-03-15 — End: 2024-03-14

## 2023-03-15 MED ORDER — CYCLOBENZAPRINE 5 MG TABLET
ORAL_TABLET | Freq: Three times a day (TID) | ORAL | 0 refills | 30 days
Start: 2023-03-15 — End: 2023-04-14

## 2023-03-15 MED FILL — COLCHICINE 0.6 MG TABLET: ORAL | 30 days supply | Qty: 30 | Fill #0

## 2023-03-17 ENCOUNTER — Ambulatory Visit: Admit: 2023-03-17 | Discharge: 2023-04-15 | Payer: PRIVATE HEALTH INSURANCE

## 2023-03-17 ENCOUNTER — Ambulatory Visit: Admit: 2023-03-17 | Payer: PRIVATE HEALTH INSURANCE

## 2023-03-18 ENCOUNTER — Ambulatory Visit: Admit: 2023-03-18 | Discharge: 2023-03-19 | Payer: PRIVATE HEALTH INSURANCE

## 2023-03-18 MED ORDER — CYCLOBENZAPRINE 5 MG TABLET
ORAL_TABLET | Freq: Three times a day (TID) | ORAL | 0 refills | 30.00000 days | Status: CP
Start: 2023-03-18 — End: 2023-04-17
  Filled 2023-03-18: qty 90, 30d supply, fill #0

## 2023-03-18 MED ORDER — PREGABALIN 75 MG CAPSULE
ORAL_CAPSULE | Freq: Three times a day (TID) | ORAL | 3 refills | 90 days | Status: CP
Start: 2023-03-18 — End: 2024-03-17
  Filled 2023-03-18: qty 90, 30d supply, fill #0

## 2023-03-18 MED ORDER — FREESTYLE LIBRE 3 SENSOR DEVICE
3 refills | 84 days | Status: CP
Start: 2023-03-18 — End: ?

## 2023-03-18 MED ORDER — VARENICLINE 1 MG TABLET
ORAL_TABLET | Freq: Every day | ORAL | 3 refills | 30 days | Status: CP
Start: 2023-03-18 — End: ?
  Filled 2023-03-18: qty 30, 30d supply, fill #0

## 2023-03-18 MED ORDER — CHLORHEXIDINE GLUCONATE 0.12 % MOUTHWASH
Freq: Two times a day (BID) | OROMUCOSAL | 3 refills | 79 days | Status: CP
Start: 2023-03-18 — End: 2024-01-27
  Filled 2023-03-18: qty 946, 32d supply, fill #0

## 2023-03-18 MED ORDER — OXYCODONE 5 MG TABLET
ORAL_TABLET | Freq: Two times a day (BID) | ORAL | 0 refills | 30.00000 days | Status: CP | PRN
Start: 2023-03-18 — End: 2023-04-17
  Filled 2023-03-18: qty 60, 30d supply, fill #0

## 2023-03-18 MED ORDER — MAGNESIUM OXIDE 400 MG (241.3 MG MAGNESIUM) TABLET
ORAL_TABLET | Freq: Every day | ORAL | 11 refills | 30 days | Status: CP
Start: 2023-03-18 — End: 2024-03-17
  Filled 2023-03-18: qty 30, 30d supply, fill #0

## 2023-03-18 MED ORDER — CETIRIZINE 10 MG TABLET
ORAL_TABLET | Freq: Every day | ORAL | 3 refills | 90 days | Status: CP
Start: 2023-03-18 — End: 2024-03-17
  Filled 2023-03-18: qty 30, 30d supply, fill #0

## 2023-03-18 MED ORDER — DIMETHYL FUMARATE 240 MG CAPSULE,DELAYED RELEASE
ORAL_CAPSULE | Freq: Two times a day (BID) | ORAL | 1 refills | 90 days | Status: CP
Start: 2023-03-18 — End: ?
  Filled 2023-03-18: qty 180, 90d supply, fill #0

## 2023-03-18 MED ORDER — VENTOLIN HFA 90 MCG/ACTUATION AEROSOL INHALER
Freq: Four times a day (QID) | RESPIRATORY_TRACT | 5 refills | 25 days | Status: CP | PRN
Start: 2023-03-18 — End: ?

## 2023-03-18 MED ORDER — DICLOFENAC 1 % TOPICAL GEL
Freq: Four times a day (QID) | TOPICAL | 6 refills | 25 days | Status: CP
Start: 2023-03-18 — End: 2024-03-17
  Filled 2023-05-07: qty 200, 25d supply, fill #0

## 2023-03-18 MED ORDER — IBUPROFEN 600 MG TABLET
ORAL_TABLET | Freq: Four times a day (QID) | ORAL | 0 refills | 8 days | Status: CP | PRN
Start: 2023-03-18 — End: ?
  Filled 2023-03-18: qty 30, 8d supply, fill #0

## 2023-03-18 MED ORDER — AMLODIPINE 10 MG TABLET
ORAL_TABLET | Freq: Every day | ORAL | 3 refills | 90 days | Status: CP
Start: 2023-03-18 — End: ?
  Filled 2023-04-14: qty 90, 90d supply, fill #0

## 2023-03-18 MED ORDER — FLUTICASONE PROPIONATE 50 MCG/ACTUATION NASAL SPRAY,SUSPENSION
Freq: Every day | NASAL | 11 refills | 120 days | Status: CP
Start: 2023-03-18 — End: 2024-03-17
  Filled 2023-04-01: qty 16, 60d supply, fill #0

## 2023-03-18 MED ORDER — ALBUTEROL SULFATE 2.5 MG/3 ML (0.083 %) SOLUTION FOR NEBULIZATION
RESPIRATORY_TRACT | 0 refills | 30 days | Status: CP
Start: 2023-03-18 — End: 2024-03-17
  Filled 2023-03-18: qty 540, 30d supply, fill #0
  Filled 2023-03-18: qty 18, 25d supply, fill #0

## 2023-03-18 MED ORDER — EMPAGLIFLOZIN 25 MG TABLET
ORAL_TABLET | Freq: Every day | ORAL | 3 refills | 90 days | Status: CP
Start: 2023-03-18 — End: 2024-03-17
  Filled 2023-04-14: qty 90, 90d supply, fill #0

## 2023-03-18 MED ORDER — DULOXETINE 60 MG CAPSULE,DELAYED RELEASE
ORAL_CAPSULE | Freq: Every day | ORAL | 3 refills | 90 days | Status: CP
Start: 2023-03-18 — End: 2024-03-12
  Filled 2023-04-08: qty 90, 90d supply, fill #0

## 2023-03-18 MED ORDER — TRULICITY 1.5 MG/0.5 ML SUBCUTANEOUS PEN INJECTOR
SUBCUTANEOUS | 3 refills | 84 days | Status: CP
Start: 2023-03-18 — End: 2024-03-17
  Filled 2023-04-01: qty 2, 28d supply, fill #0

## 2023-03-18 MED ORDER — ASPIRIN 81 MG TABLET,DELAYED RELEASE
ORAL_TABLET | Freq: Every day | ORAL | 3 refills | 90 days | Status: CP
Start: 2023-03-18 — End: 2024-03-17
  Filled 2023-03-18: qty 90, 90d supply, fill #0

## 2023-03-18 MED ORDER — QUETIAPINE 200 MG TABLET
ORAL_TABLET | Freq: Every evening | ORAL | 3 refills | 90 days | Status: CP
Start: 2023-03-18 — End: 2024-03-17
  Filled 2023-04-14: qty 90, 90d supply, fill #0

## 2023-03-18 MED ORDER — LEVOTHYROXINE 100 MCG TABLET
ORAL_TABLET | Freq: Every day | ORAL | 0 refills | 90 days | Status: CP
Start: 2023-03-18 — End: ?
  Filled 2023-03-18: qty 90, 90d supply, fill #0

## 2023-03-18 MED ORDER — AMANTADINE HCL 100 MG CAPSULE
ORAL_CAPSULE | Freq: Two times a day (BID) | ORAL | 5 refills | 30 days | Status: CP
Start: 2023-03-18 — End: ?
  Filled 2023-03-18: qty 60, 30d supply, fill #0

## 2023-03-18 MED ORDER — ONDANSETRON HCL 4 MG TABLET
ORAL_TABLET | Freq: Every day | ORAL | 1 refills | 30 days | Status: CP | PRN
Start: 2023-03-18 — End: 2024-03-17
  Filled 2023-03-18: qty 30, 30d supply, fill #0

## 2023-03-18 MED ORDER — ACETAMINOPHEN 500 MG TABLET
ORAL_TABLET | Freq: Three times a day (TID) | ORAL | 11 refills | 34 days | Status: CP
Start: 2023-03-18 — End: 2024-03-17
  Filled 2023-03-18: qty 200, 34d supply, fill #0

## 2023-03-18 MED ORDER — EZETIMIBE 10 MG TABLET
ORAL_TABLET | Freq: Every day | ORAL | 3 refills | 90 days | Status: CP
Start: 2023-03-18 — End: 2024-03-17
  Filled 2023-03-26: qty 90, 90d supply, fill #0

## 2023-03-18 MED ORDER — LISINOPRIL 20 MG TABLET
ORAL_TABLET | Freq: Every day | ORAL | 3 refills | 90 days | Status: CP
Start: 2023-03-18 — End: ?
  Filled 2023-04-01: qty 90, 90d supply, fill #0

## 2023-03-18 MED ADMIN — ketorolac (TORADOL) injection 30 mg: 30 mg | INTRAMUSCULAR | @ 15:00:00 | Stop: 2023-03-18

## 2023-03-18 MED FILL — NICOTINE 21 MG/24 HR DAILY TRANSDERMAL PATCH: TRANSDERMAL | 28 days supply | Qty: 28 | Fill #1

## 2023-03-18 NOTE — Unmapped (Signed)
Medication Request     Patient Name: SHONTAY SPADEA   Caller: Self (Patient)  Have you contacted your pharmacy? yes      Last Visit: 03/18/2023       Medication Name: oxyCODONE (ROXICODONE) 5 MG immediate release tablet [[id number] (should be sent to Out Pt in [place]), ALL OTHER MEDS TO   [facility]     Dosage:  Route: Oral (PO)  Frequency: Daily  Day Supply Requested: 25  Pharmacy (Name & Address):   [facility]   AND [place] [facility] PT PHARM ONLY  oxyCODONE (ROXICODONE) 5 MG immediate release tablet [[id number]   Pharmacy Phone Number:
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
## 2023-03-18 NOTE — Unmapped (Signed)
Spoke with patient to let her know prescription was sent.

## 2023-03-19 NOTE — Unmapped (Signed)
I was available to discuss the case with the resident. I have reviewed the note and agree with the plan of treatment as documented.

## 2023-03-25 NOTE — Unmapped (Signed)
Patient Advice Request     Patient Name: Tonya Wood  Caller: Self (Patient)  Contact Method: MyChart  Reason for Call: Pt needs the sensor to be put in her arm. She said that [name] has them.   Previously Discussed: no  Appointment Offered: No

## 2023-03-26 NOTE — Unmapped (Signed)
Patient called requesting a form completion as soon as possible for [employer] stating when she was diagnosed with MS. Called patient. Patient states that her account will be closed this week if she doesn't get the form completed. Gave her the fax number and encouraged her to fill out any portion of the form related to her prior to faxing it.

## 2023-03-27 MED ORDER — NICOTINE (POLACRILEX) 4 MG GUM
ORAL | 2 refills | 5.00000 days | Status: CN | PRN
Start: 2023-03-27 — End: ?

## 2023-03-27 NOTE — Unmapped (Signed)
Medication Request     Patient Name: Tonya Wood   Caller: Self (Patient)  Have you contacted your pharmacy? yes      Last Visit: 03/18/2023       Medication Name: nicotine polacrilex (NICORETTE) 4 MG gum [[id number]]   Dosage:4 MG  Route: Oral (PO)  Frequency: Daily  Day Supply Requested: 88  Pharmacy (Name & Address): [facility] out pt pharmacy   Pharmacy Phone Number: [phone]

## 2023-03-28 MED ORDER — NICOTINE (POLACRILEX) 4 MG GUM
ORAL | 2 refills | 5 days | Status: CP | PRN
Start: 2023-03-28 — End: ?
  Filled 2023-03-29: qty 110, 5d supply, fill #0

## 2023-04-01 NOTE — Unmapped (Signed)
[facility] Specialty Medication Onboarding    Specialty Medication: TRULICITY 1.5 mg/0.5 mL Pnij (dulaglutide)  Prior Authorization: Not Required   Financial Assistance: No - copay  <$25  Final Copay/Day Supply: $4 / 28    Insurance Restrictions: None     Notes to Pharmacist: new to therapy  Credit Card on File: no    The triage team has completed the benefits investigation and has determined that the patient is able to fill this medication at [facility]. Please contact the patient to complete the onboarding or follow up with the prescribing physician as needed.
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
## 2023-04-02 NOTE — Unmapped (Incomplete)
Medications for quitting smoking:  Varenacline (Chantix) 1 tablet (1 mg) twice daily  Nicotine patch and gum    COPD medications:  STOP Anoro Ellipta  START Trelegy Ellipta 1 puff once daily  Albuterol 1-2 puffs every 4-6 hours as needed for shortness of breath or wheezing    Blood pressure medications:  Lisinopril 1 tablet (20 mg) daily  Amlodipine 1 tablet (10 mg) daily    Diabetes medications:  Trulicity 4.5 mg injection once weekly on Sundays - refill for 4.5 mg pens [facility]  Lantus 20 units once daily    How often and when to check blood sugar:  Use Freestyle Libre 3 CGM as instructed. Change the sensors every 14 days.    How to treat low blood sugar:  For blood sugar less than 70 --> Treat with 4 ounces of juice or regular soda, or with 3 to 4 glucose tablets. Re-check blood sugar in 15 minutes.    If blood sugar is still less than 70 on re-check, treat again and re-check in 15 minutes.   If blood sugar is over 70 on re-check and it is time to eat your regular meal, eat regular meal and take insulin as prescribed for that blood sugar.    When to follow-up:  Tuesday, July 2nd at 10:20am with Dr. Hite  *** with the pharmacist      Stephano Arrants, PharmD, CPP, BCACP  Family Medicine Clinical Pharmacist  Clinic Phone: [phone]

## 2023-04-02 NOTE — Unmapped (Signed)
[facility] PHYSICAL THERAPY  [facility]  [address] [place], [place] [address]    [phone]    This note is to let you know that Tonya Wood did not show for their scheduled Physical Therapy follow-up session. Please contact me if you have any questions or concerns.    Thank you for this referral,     Signed: Barron Alvine, PT  04/02/2023 8:52 AM
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
## 2023-04-03 ENCOUNTER — Ambulatory Visit: Admit: 2023-04-03 | Payer: PRIVATE HEALTH INSURANCE | Attending: Ambulatory Care | Primary: Ambulatory Care

## 2023-04-03 NOTE — Unmapped (Unsigned)
Assessment and Plan:     There are no diagnoses linked to this encounter.  Multiple diagnoses above reviewed with patient  Continue current medication management  Rx as per orders; reviewed risks/benefits, possible side effects and patient verbalizes understanding  Referrals/labs as per orders above  Recommend supportive treatment with lifestyle modifications with diet/exercise  Follow up in 3-4 months or sooner prn      Barriers to recommended plan: {barrierstocare:74100}    I provided an intervention for the {SDOH ZOXWRU:04540} SDOH domain. The intervention was {SDOH INTERVENTION:69735}     PHQ-2 Score:      PHQ-9 Score:      {select_status_or_delete_smartlist:64641}      I personally spent *** minutes face-to-face and non-face-to-face in the care of this patient, which includes all pre, intra, and post visit time (reviewing, evaluating and discussing pertinent records for patient's care) on the date of service.     No follow-ups on file.    Subjective:     HPI: Tonya Wood is a 57 y.o. female here for establishing care. Prior PCP at [facility]. Pt with h/o COPD, DM2, allergies, MS, chronic pain, LBP, arthralgias, HTN, OA, tobacco use.      HPI   HM  Due for Zoster vaccine, otherwise UTD. Colonoscopy ordered 06/06/2022 but pt has not completed this. Mammogram UTD, last completed 11/20/2022 and negative. Pap UTD, last completed 01/16/2021 NILM and HPV negative; repeat in 5 years.    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 See medication list for meds. Pt follows with ortho Ronaldo Miyamoto Spinrad PA) for management of OA of L knee, last seen 02/20/23. She has also seen [facility] 11/28/22.    MS  Following with [facility] (Dr. Elvera Bicker), last seen 02/26/2023. Currently taking DMF 240 mg BID (started 12/2022).    DM2  Managing with Trulicity 1.5 mg weekly, Jardiance 25 mg daily, and Lantus 20 U nightly. Also taking Zetia 10 mg daily. Tolerating meds well. Pt has Freestyle Libre CGM. Last A1c was 5.7 (01/01/2023).    Wt Readings from Last 3 Encounters:   03/18/23 90.8 kg (200 lb 3.2 oz)   02/26/23 92.5 kg (204 lb)   02/20/23 94 kg (207 lb 3.2 oz)     Hypertension  Patient with history of hypertension manages with amlodipine 10 mg daily and lisinopril 20 mg daily. Blood pressure at triage today ***. Denies CP, SOB, L/D or peripheral edema.    COPD  Using Anoro Ellipta inhaler 1 puff daily and Ventolin inhaler prn. Pt taking Zyrtec 10 mg daily as well. Has history of smoking and currently on Chantix 1 mg for cessation.    Hypothyroidism  Symptoms well controlled on levothyroxine 100 mcg daily. Last TSH was 3.012 (01/01/23).    Mental health  History of MDD, anxiety, schizoaffective disorder, and PTSD. Pt currently takes Seroquel 200 mg nightly and Symmetrel 100 mg BID.        ROS:   Review of Systems     Review of systems negative unless otherwise noted as per HPI.      The following portions of the patient's history were reviewed and updated as appropriate: allergies, current medications, past family history, past medical history, past social history, past surgical history and problem list.     Objective:     There were no vitals filed for this visit.  There is no height or weight  on file to calculate BMI.    Physical Exam     Allergies:     Grass pollen-orchardgrass, standard    PCMH:     Medication adherence and barriers to the treatment plan have been addressed. Opportunities to optimize healthy behaviors have been discussed. Patient / caregiver voiced understanding.        Payton Mccallum, MD

## 2023-04-08 MED FILL — VENTOLIN HFA 90 MCG/ACTUATION AEROSOL INHALER: RESPIRATORY_TRACT | 75 days supply | Qty: 54 | Fill #1
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
## 2023-04-09 NOTE — Unmapped (Signed)
[facility] PHYSICAL THERAPY  [facility]  [address] [place], [place] [address]    [phone]    This note is to let you know that LAINI RONGEY did not show for their scheduled Physical Therapy follow-up session. Second no show. Please contact me if you have any questions or concerns.    Thank you for this referral,     Signed: Barron Alvine, PT  04/09/2023 1:40 PM

## 2023-04-11 NOTE — Unmapped (Signed)
Medication Request     Patient Name: Tonya Wood   Caller: Self (Patient)  Name of Caller:  Have you contacted your pharmacy? yes      Last Visit: 03/18/2023       Medication Name: Oxycodone  Dosage:  Route: Oral (PO)  Frequency: As Needed (PRN)  Day Supply Requested: 30  Pharmacy (Name & Address): [facility]    [address], [place], [place] [address]    Pharmacy Phone Number:  [phone]    Patient has an appointment scheduled on 7/30

## 2023-04-14 DIAGNOSIS — E1159 Type 2 diabetes mellitus with other circulatory complications: Principal | ICD-10-CM

## 2023-04-14 DIAGNOSIS — Z794 Long term (current) use of insulin: Principal | ICD-10-CM

## 2023-04-15 ENCOUNTER — Ambulatory Visit: Admit: 2023-04-15 | Discharge: 2023-04-16 | Payer: PRIVATE HEALTH INSURANCE

## 2023-04-15 LAB — TOXICOLOGY SCREEN, URINE
AMPHETAMINE SCREEN URINE: NEGATIVE
BARBITURATE SCREEN URINE: NEGATIVE
BENZODIAZEPINE SCREEN, URINE: NEGATIVE
BUPRENORPHINE, URINE SCREEN: NEGATIVE
CANNABINOID SCREEN URINE: NEGATIVE
COCAINE(METAB.)SCREEN, URINE: NEGATIVE
FENTANYL SCREEN, URINE: NEGATIVE
METHADONE SCREEN, URINE: NEGATIVE
OPIATE SCREEN URINE: NEGATIVE
OXYCODONE SCREEN URINE: NEGATIVE

## 2023-04-15 MED ORDER — OXYCODONE 5 MG TABLET
ORAL_TABLET | Freq: Two times a day (BID) | ORAL | 0 refills | 30 days | Status: CP | PRN
Start: 2023-04-15 — End: ?
  Filled 2023-04-15: qty 60, 30d supply, fill #0

## 2023-04-15 MED ORDER — NICOTINE (POLACRILEX) 4 MG GUM
ORAL | 3 refills | 5 days | Status: CP | PRN
Start: 2023-04-15 — End: ?
  Filled 2023-04-23: qty 110, 5d supply, fill #0

## 2023-04-15 NOTE — Unmapped (Addendum)
For your feet wounds, call [facility]:  [facility]  [phone]  [address] ?[address] ?[place], [place] [address]    Put plenty of Vaseline Ointment/Petroleum Jelly on the foot wounds to soften it up.

## 2023-04-15 NOTE — Unmapped (Signed)
[facility] PHYSICAL THERAPY  [facility]  [address]                                    [place], [place] [address]    [phone]    This note is to let you know that RAHEL MOTTOLA did show for their scheduled Physical Therapy follow-up session, but arrived at the wrong time and was unable to stay for her appointment. Please contact me if you have any questions or concerns.    Thank you for this referral,     Signed: Barron Alvine, PT  04/15/2023 2:23 PM

## 2023-04-15 NOTE — Unmapped (Signed)
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 coming in for the following issues:    Diagnoses and all orders for this visit:    Chronic pain syndrome  Assessment & Plan:  Pt with chronic pain on oxycodone, takes 1-3 tabs daily, usually BID, but has been requiring TID for the last few days/weeks due to her knee pain (needs knee replacement but cannot get it until all teeth are pulled by dentistry). PDMP reviewed and appropriate. Utox with negative oxycodone, will send for opiate confirmation. Controlled med agreement signed and filled out today.  Current pain regimen:  - Oxycodone 5 mg BID prn  - Pregabalin (Lyrica) 75 mg TID - can increase in increments of 25 to 150 mg/day weekly up 300 to 600 mg/day in 2 to 3 divided doses  - Duloxetine (cymbalta) 60 mg daily - can go up to 120 mg/day but may not have benefit  - Cyclobenzaprine (Flexeril) 5 mg TID  - Tylenol 1000 mg TID  - (Not taking) Ibuprofen 600 mg TID    Orders:  -     oxyCODONE (ROXICODONE) 5 MG immediate release tablet; Take 1 tablet (5 mg total) by mouth two (2) times a day as needed for pain.  -     Toxicology Screen, Urine; Future  -     Opiates confirmation, random, urine    Chronic obstructive pulmonary disease, unspecified COPD type (CMS-HCC)  Assessment & Plan:  Pt with some dyspnea during exam but speaking in full sentences. MMRC assessment not completed in visit today; pt has appt with pharmacy this week and will review inhalers and symptoms at that visit. Currently only on LAMA/LABA, consider LAMA/LABA/ICS if worsening symptoms. Also pt is on lyrica for pain which may increase risk of COPD exac.  Current inhaler regimen:  - Umeclidinium-vilanterol (Anoro-Ellipta) 62.5-25 1 puff daily  - Albuterol nebulizer 2.5 mg q4h prn  - Duonebs 2.5 mg/24mL q6h prn      Severe tobacco use disorder  Assessment & Plan:  Pt with severe nicotine dependence with classic symptoms: nocturnal smoking, smokes within 5 min of waking, severe withdrawal. Pt did get connected with tobacco cessation program, was given patch and gum (feels gum helps most), started chantix, was able to quit for about 1 month, then restarted again a few days ago. Has also tried wellbutrin for smoking cessation. Was smoking up to 30 cigs/day, now reduced but did not clarify # per day at this visit. Encouraged use of patch, gum, chantix. Discussed risks of smoking with COPD. Will continue to address at further visits.    Orders:  -     nicotine polacrilex (NICORETTE) 4 MG gum; Chew 1 piece of gum (4 mg total) by mouth every hour as directed as needed for smoking cessation.    Toe injury, right, sequela  Assessment & Plan:  Pt with injury to knuckle of R 3rd toe, unsure how she got the injury. Slight bruising of toe with healing wound, no erythema  or purulence but tender over the middle phalanx. Pt also with painful callus in plantar aspect below first MTP. Pt does have DM2, worry about DM foot injuries. Recommended better foot care. Will send to podiatry.    Orders:  -     Ambulatory referral to Podiatry; Future    Polypharmacy  -     Ambulatory referral to [facility]; Future       Luther Hearing MD MPH  Family Medicine PGY3    Attending: Dr. Landry Dyke    Subjective   Tonya Wood is a 57 y.o. female coming to clinic today for the following issues:    Chief Complaint   Patient presents with    Follow-up    Medication Refill    Toe Pain     -on the right foot, dark, swollen and painfully     Generalized Body Aches       HPI:     # Opioid refill  Has been on oxycodone for a few months, ~6 months  Started taking 2 times a day, now taking sometimes 3 times a day.  Has left leg pain  Take Tylenol 1000 mg with oxycodone 2-3 times a day  Pregabalin taking 75 mg three times a day  Not taking any advil/motrin      # Toe wound      I have reviewed the problem list, medications, and allergies and have updated/reconciled them if needed.    Tonya Wood  reports that she has been smoking cigarettes. She started smoking about 37 years ago. She has a 75 pack-year smoking history. She has never used smokeless tobacco.  Health Maintenance   Topic Date Due    Zoster Vaccines (1 of 2) Never done    Retinal Eye Exam  04/24/2021    Influenza Vaccine (1) 05/18/2023    Colon Cancer Screening  05/22/2023    Hemoglobin A1c  07/03/2023    Lung Cancer Screening Shared Decision Making  12/03/2023    Foot Exam  01/01/2024    Urine Albumin/Creatinine Ratio  01/01/2024    Serum Creatinine Monitoring  02/20/2024    Potassium Monitoring  02/20/2024    Mammogram Start Age 70  11/19/2024    HPV Cotest with Pap Smear (21-65)  01/16/2026    Pap Smear with Cotest HPV (21-65)  01/16/2026    COPD Spirometry  05/09/2027    Lipid Screening  09/26/2027    DTaP/Tdap/Td Vaccines (3 - Td or Tdap) 05/04/2029    Pneumococcal Vaccine 0-64  Completed    Hepatitis C Screen  Completed    COVID-19 Vaccine  Completed       Objective     VITALS: BP 104/82  - Pulse 88  - Temp 36.2 ??C (97.2 ??F) (Temporal)  - Ht 170.2 cm (5' 7)  - Wt 89.9 kg (198 lb 3.2 oz)  - BMI 31.04 kg/m??     Physical Exam  GEN: well appearing, appears stated age, NAD   HEENT: NCAT, No scleral icterus. MMM. EOMI.  CV: Clinically well perfused. RRR, no MRG. Cap refill <2 sec.  Pulm: Speaking in full sentences but unable to take deep breaths, slightly dyspneic by end of visit without use of accessory muscles, nasal flaring, grunting. Breathing ORA. Lungs CTAB though poor effort. No wheezes, rales, ronchi.  Abd: Flat, nontender, nondistended.  Neuro: A&O x 3. No focal deficits.  Ext: Warm. Moves all extremities spontaneously.  Skin: Slight bruising of third toe middle phalanx with healing  wound, no erythema or purulence but tender over the middle phalanx  Psych: Normal and appropriate affect.          LABS/IMAGING  I have reviewed pertinent recent labs and imaging in Epic    [facility]  [place] [facility] [place] [facility]  [address], [place], [place] [address]  Telephone [phone]  Fax [phone]  [email or web address]

## 2023-04-16 NOTE — Unmapped (Incomplete)
Medications for quitting smoking:  Varenacline (Chantix) 1 tablet (1 mg) twice daily  Nicotine patch and gum    COPD medications:  STOP Anoro Ellipta  START Trelegy Ellipta 1 puff once daily  Albuterol 1-2 puffs every 4-6 hours as needed for shortness of breath or wheezing    Blood pressure medications:  Lisinopril 1 tablet (20 mg) daily  Amlodipine 1 tablet (10 mg) daily    Diabetes medications:  Trulicity 4.5 mg injection once weekly on Sundays - refill for 4.5 mg pens [facility]  Lantus 20 units once daily    How often and when to check blood sugar:  Use Freestyle Libre 3 CGM as instructed. Change the sensors every 14 days.    How to treat low blood sugar:  For blood sugar less than 70 --> Treat with 4 ounces of juice or regular soda, or with 3 to 4 glucose tablets. Re-check blood sugar in 15 minutes.    If blood sugar is still less than 70 on re-check, treat again and re-check in 15 minutes.   If blood sugar is over 70 on re-check and it is time to eat your regular meal, eat regular meal and take insulin as prescribed for that blood sugar.    When to follow-up:  Tuesday, July 2nd at 10:20am with Dr. Hite  *** with the pharmacist      Stephano Arrants, PharmD, CPP, BCACP  Family Medicine Clinical Pharmacist  Clinic Phone: [phone]

## 2023-04-16 NOTE — Unmapped (Signed)
[facility] Refill Coordination Note    Specialty Lite Medication(s) to be Shipped:   Trulicity    Other medication(s) to be shipped:  [employer], Nicorette gum 4mg      MELISE WIEDEMANN, DOB: [birth year]  Phone: [phone] (home) [phone] (work)      All above HIPAA information was verified with patient.     Was a Nurse, learning disability used for this call? No    Changes to medications: Rui reports no changes at this time.  Changes to insurance: No      REFERRAL TO PHARMACIST     Referral to the pharmacist: Not needed      [facility]     Shipping address confirmed in Epic.     Delivery Scheduled: Yes, Expected medication delivery date: 8/5.     Medication will be delivered via Same Day Courier to the prescription address in Epic WAM.    Alwyn Pea   [facility] Specialty Technician

## 2023-04-16 NOTE — Unmapped (Signed)
Pt with injury to knuckle of R 3rd toe, unsure how she got the injury. Slight bruising of toe with healing wound, no erythema or purulence but tender over the middle phalanx. Pt also with painful callus in plantar aspect below first MTP. Pt does have DM2, worry about DM foot injuries. Recommended better foot care. Will send to podiatry.

## 2023-04-16 NOTE — Unmapped (Signed)
Pt with severe nicotine dependence with classic symptoms: nocturnal smoking, smokes within 5 min of waking, severe withdrawal. Pt did get connected with tobacco cessation program, was given patch and gum (feels gum helps most), started chantix, was able to quit for about 1 month, then restarted again a few days ago. Has also tried wellbutrin for smoking cessation. Was smoking up to 30 cigs/day, now reduced but did not clarify # per day at this visit. Encouraged use of patch, gum, chantix. Discussed risks of smoking with COPD. Will continue to address at further visits.

## 2023-04-16 NOTE — Unmapped (Addendum)
Pt with chronic pain on oxycodone, takes 1-3 tabs daily, usually BID, but has been requiring TID for the last few days/weeks due to her knee pain (needs knee replacement but cannot get it until all teeth are pulled by dentistry). PDMP reviewed and appropriate. Utox with negative oxycodone, will send for opiate confirmation. Controlled med agreement signed and filled out today.  Current pain regimen:  - Oxycodone 5 mg BID prn  - Pregabalin (Lyrica) 75 mg TID - can increase in increments of 25 to 150 mg/day weekly up 300 to 600 mg/day in 2 to 3 divided doses  - Duloxetine (cymbalta) 60 mg daily - can go up to 120 mg/day but may not have benefit  - Cyclobenzaprine (Flexeril) 5 mg TID  - Tylenol 1000 mg TID  - (Not taking) Ibuprofen 600 mg TID
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
## 2023-04-16 NOTE — Unmapped (Signed)
Pt with some dyspnea during exam but speaking in full sentences. MMRC assessment not completed in visit today; pt has appt with pharmacy this week and will review inhalers and symptoms at that visit. Currently only on LAMA/LABA, consider LAMA/LABA/ICS if worsening symptoms. Also pt is on lyrica for pain which may increase risk of COPD exac.  Current inhaler regimen:  - Umeclidinium-vilanterol (Anoro-Ellipta) 62.5-25 1 puff daily  - Albuterol nebulizer 2.5 mg q4h prn  - Duonebs 2.5 mg/41mL q6h prn

## 2023-04-17 ENCOUNTER — Ambulatory Visit: Admit: 2023-04-17 | Payer: PRIVATE HEALTH INSURANCE

## 2023-04-17 ENCOUNTER — Ambulatory Visit: Admit: 2023-04-17 | Payer: PRIVATE HEALTH INSURANCE | Attending: Ambulatory Care | Primary: Ambulatory Care

## 2023-04-18 NOTE — Unmapped (Signed)
Immediately after or during the visit, I reviewed with the resident the medical history and the resident???s findings on physical examination.  I discussed with the resident the patient???s diagnosis and concur with the treatment plan as documented in the resident note. Karle Starch, MD

## 2023-04-19 DIAGNOSIS — S99921S Unspecified injury of right foot, sequela: Principal | ICD-10-CM

## 2023-04-19 NOTE — Unmapped (Signed)
Addended by: Luther Hearing on: 04/19/2023 11:54 AM     Modules accepted: Orders

## 2023-04-21 LAB — OPIATE, URINE, QUANTITATIVE
6-MONOACETYLMRPH: <5 ng/mL (ref <5)
BUPRENORPHINE: <5 ng/mL (ref <5)
CODEINE GC/MS CONF: <25 ng/mL (ref <25)
FENTANYL, URINE GC/MS: <0.5 ng/mL (ref <0.5)
HYDROCODONE GC/MS CONF: <25 ng/mL (ref <25)
HYDROMORPHONE GC/MS CONF: <25 ng/mL (ref <25)
MORPHINE GC/MS CONF: <25 ng/mL (ref <25)
NORBUPRENORPHINE: <5 ng/mL (ref <5)
NORFENTANYL, UR GC/MS: <1 ng/mL (ref <1.0)
OPIATE INTERP: NEGATIVE
OXYCODONE (GC/MS): <25 ng/mL (ref <25)
OXYMORPHONE: <25 ng/mL (ref <25)

## 2023-04-23 MED FILL — TRULICITY 1.5 MG/0.5 ML SUBCUTANEOUS PEN INJECTOR: SUBCUTANEOUS | 28 days supply | Qty: 2 | Fill #1

## 2023-04-23 MED FILL — FREESTYLE LIBRE 3 SENSOR DEVICE: 84 days supply | Qty: 6 | Fill #0

## 2023-04-28 NOTE — Unmapped (Signed)
Patient Advice Request     Patient Name: Tonya Wood  Caller:  Coffee shop  Contact Method: Telephone Call: Time- Any Time    Reason for Call: Please send AMB REFERRAL TO PODIATRY to the right location, Referral was sent to a coffee shop instead of Podiatry location  Previously Discussed: no  Appointment Offered: No

## 2023-05-03 ENCOUNTER — Ambulatory Visit
Admit: 2023-05-03 | Discharge: 2023-05-03 | Disposition: A | Discharge status: Home / Self Care | Payer: PRIVATE HEALTH INSURANCE | Attending: Student in an Organized Health Care Education/Training Program

## 2023-05-03 ENCOUNTER — Emergency Department
Admit: 2023-05-03 | Discharge: 2023-05-03 | Disposition: A | Discharge status: Home / Self Care | Payer: PRIVATE HEALTH INSURANCE | Attending: Student in an Organized Health Care Education/Training Program

## 2023-05-03 DIAGNOSIS — H9209 Otalgia, unspecified ear: Principal | ICD-10-CM

## 2023-05-03 DIAGNOSIS — R45851 Suicidal ideations: Principal | ICD-10-CM

## 2023-05-03 LAB — URINALYSIS WITH MICROSCOPY WITH CULTURE REFLEX PERFORMABLE
BACTERIA: NONE SEEN /HPF
BILIRUBIN UA: NEGATIVE
BLOOD UA: NEGATIVE
GLUCOSE UA: >1000 — AB
KETONES UA: NEGATIVE
LEUKOCYTE ESTERASE UA: NEGATIVE
NITRITE UA: NEGATIVE
PH UA: 5.5 (ref 5.0–9.0)
PROTEIN UA: NEGATIVE
RBC UA: 1 /HPF (ref <=4)
SPECIFIC GRAVITY UA: 1.037 — ABNORMAL HIGH (ref 1.003–1.030)
SQUAMOUS EPITHELIAL: 1 /HPF (ref 0–5)
UROBILINOGEN UA: <2
WBC UA: <1 /HPF (ref 0–5)

## 2023-05-03 LAB — CBC W/ AUTO DIFF
BASOPHILS ABSOLUTE COUNT: 0 10*9/L (ref 0.0–0.1)
BASOPHILS RELATIVE PERCENT: 0.6 %
EOSINOPHILS ABSOLUTE COUNT: 0.1 10*9/L (ref 0.0–0.5)
EOSINOPHILS RELATIVE PERCENT: 2.2 %
HEMATOCRIT: 43.3 % (ref 34.0–44.0)
HEMOGLOBIN: 14.7 g/dL (ref 11.3–14.9)
LYMPHOCYTES ABSOLUTE COUNT: 1.3 10*9/L (ref 1.1–3.6)
LYMPHOCYTES RELATIVE PERCENT: 24.2 %
MEAN CORPUSCULAR HEMOGLOBIN CONC: 33.9 g/dL (ref 32.0–36.0)
MEAN CORPUSCULAR HEMOGLOBIN: 30.5 pg (ref 25.9–32.4)
MEAN CORPUSCULAR VOLUME: 89.9 fL (ref 77.6–95.7)
MEAN PLATELET VOLUME: 8.4 fL (ref 6.8–10.7)
MONOCYTES ABSOLUTE COUNT: 0.4 10*9/L (ref 0.3–0.8)
MONOCYTES RELATIVE PERCENT: 7.2 %
NEUTROPHILS ABSOLUTE COUNT: 3.6 10*9/L (ref 1.8–7.8)
NEUTROPHILS RELATIVE PERCENT: 65.8 %
NUCLEATED RED BLOOD CELLS: 0 /100{WBCs} (ref <=4)
PLATELET COUNT: 196 10*9/L (ref 150–450)
RED BLOOD CELL COUNT: 4.82 10*12/L (ref 3.95–5.13)
RED CELL DISTRIBUTION WIDTH: 13.6 % (ref 12.2–15.2)
WBC ADJUSTED: 5.4 10*9/L (ref 3.6–11.2)

## 2023-05-03 LAB — COMPREHENSIVE METABOLIC PANEL
ALBUMIN: 4.3 g/dL (ref 3.4–5.0)
ALKALINE PHOSPHATASE: 87 U/L (ref 46–116)
ALT (SGPT): 16 U/L (ref 10–49)
ANION GAP: 6 mmol/L (ref 5–14)
AST (SGOT): 16 U/L (ref <=34)
BILIRUBIN TOTAL: 0.7 mg/dL (ref 0.3–1.2)
BLOOD UREA NITROGEN: 12 mg/dL (ref 9–23)
BUN / CREAT RATIO: 17
CALCIUM: 9.9 mg/dL (ref 8.7–10.4)
CHLORIDE: 108 mmol/L — ABNORMAL HIGH (ref 98–107)
CO2: 29.5 mmol/L (ref 20.0–31.0)
CREATININE: 0.7 mg/dL
EGFR CKD-EPI (2021) FEMALE: >90 mL/min/{1.73_m2} (ref >=60)
GLUCOSE RANDOM: 126 mg/dL (ref 70–179)
POTASSIUM: 3.8 mmol/L (ref 3.4–4.8)
PROTEIN TOTAL: 6.8 g/dL (ref 5.7–8.2)
SODIUM: 143 mmol/L (ref 135–145)

## 2023-05-03 LAB — SALICYLATE LEVEL: SALICYLATE LEVEL: <3 mg/dL (ref <=30.0)

## 2023-05-03 LAB — TSH: THYROID STIMULATING HORMONE: 0.889 u[IU]/mL (ref 0.550–4.780)

## 2023-05-03 LAB — TOXICOLOGY SCREEN, URINE
AMPHETAMINE SCREEN URINE: NEGATIVE
BARBITURATE SCREEN URINE: NEGATIVE
BENZODIAZEPINE SCREEN, URINE: NEGATIVE
BUPRENORPHINE, URINE SCREEN: NEGATIVE
CANNABINOID SCREEN URINE: NEGATIVE
COCAINE(METAB.)SCREEN, URINE: NEGATIVE
FENTANYL SCREEN, URINE: NEGATIVE
METHADONE SCREEN, URINE: NEGATIVE
OPIATE SCREEN URINE: NEGATIVE
OXYCODONE SCREEN URINE: POSITIVE — AB

## 2023-05-03 LAB — ACETAMINOPHEN LEVEL: ACETAMINOPHEN LEVEL: 2.7 ug/mL (ref <=20.0)

## 2023-05-03 LAB — ETHANOL: ETHANOL: <10 mg/dL (ref <=10)

## 2023-05-03 MED ORDER — OXYCODONE 5 MG TABLET
ORAL_TABLET | ORAL | 0 refills | 2 days | Status: CP | PRN
Start: 2023-05-03 — End: 2023-05-08

## 2023-05-03 MED ADMIN — acetaminophen (TYLENOL) tablet 650 mg: 650 mg | ORAL | @ 17:00:00 | Stop: 2023-05-03

## 2023-05-03 MED ADMIN — oxyCODONE (ROXICODONE) immediate release tablet 5 mg: 5 mg | ORAL | @ 17:00:00 | Stop: 2023-05-03

## 2023-05-03 MED ADMIN — nicotine polacrilex (NICORETTE) gum 2 mg: 2 mg | BUCCAL | @ 20:00:00 | Stop: 2023-05-03

## 2023-05-03 NOTE — Unmapped (Signed)
Bed: 02  Expected date:   Expected time:   Means of arrival:   Comments:  Room 4

## 2023-05-03 NOTE — Unmapped (Signed)
ED Progress Note    Received signout pending telepsych assessment and reassessment of patient for passive suicidal ideation.  After waiting to see telepsych, patient is requesting to be discharged.  Advised with patient who states that she has been dealing with depression for 50 years, was last admitted about 20 years ago.  She states she is tried many different medications none of them seem to help her.  She states that she has learned to deal with these passive suicidal thoughts, has coping mechanisms and states she would never act on it.  She denies any thoughts of harming herself or killing herself.  She states that she wants to stay alive in order to help care for her grandchildren who she was with at home, she states she wants to go home so that she can make dinner for the family.  She endorses depression and stress dealing with her mother at home who has never expressed left ureter throughout her life.  However she states this been going on for 50 years and nothing acute has changed today.    Discussed with patient about telepsychiatry evaluation, given that she has no active suicidal ideation or plan, has protective factors including recent slip and grandchildren at home, no access to firearms, no suicide attempts.  Will discharge with strict return precautions, told to follow-up with psychiatry for continued outpatient management of her depression.  Do not believe patient would benefit from inpatient hospitalization at this time.

## 2023-05-03 NOTE — Unmapped (Signed)
Pt arrives w/ c/o:    *BG being low today; lowest 38. Pt took 30 units at 230am.   *C/o left hip and left knee pain; pt states the pain is making her vomit  *Mouth pain   *Left eye redness and itchy  *pain to right ear   Pt was able to eat some burger and drink some soda prior to arrival.

## 2023-05-03 NOTE — Unmapped (Signed)
[facility]  Emergency Department Provider Note     ED Clinical Impression     Final diagnoses:   None      Impression, Medical Decision Making, ED Course     Impression: 57 y.o. female with PMH most significant for  HTN, HLD, T2DM, COPD, hypothyroidism, MS, MDD, PTSD, schizoaffective disorder, and substance abuse disorder who presents with progressively worsening hypoglycemia per her BG meter that reached 49 at 2:30 AM today, for which she then took 30 units of insulin, and suicidal ideation with AVH 2-3 days ago as described below.    Vitals within normal limits, afebrile. On exam, patient is well-appearing, she is tearful however nontoxic with reassuring vital signs.  Her complaints about her ear show some likely cartilaginous irregularities on her helix that are nontender, firm, without overlying erythema or warmth. Possibly post traumatic.  No tenderness over the mastoid process, physical exam is not consistent with malignant otitis externa.  I do not think that this merits an emergent ENT evaluation and have encouraged her to follow-up in the outpatient setting to have this reevaluated.    I have counseled the patient extensively regarding the use of her insulin.  On review of her medications in epic she is prescribed 30 units of Lantus nightly.  She endorses understanding to me that medication should be taken nightly and blood sugar should be checked twice daily for documentation to provide to her primary care doctor regarding any alterations in her long-acting insulin.  Reassuringly her blood sugar here is 126, she is mentating well, alert, and has been able to eat.    Her left hip and knee pain appear to be acute on chronic however will obtain x-rays as below to evaluate for any development of bony pathology.  She has no erythema, significant effusion or warmth over the joints and my suspicion for underlying infectious diagnoses such as septic arthritis is low.    Plan to obtain XR Trauma Hip Left, CBC, TSH, COVID-19 PCR, salicylate level, U-tox, UA, CMP, ethanol, acetaminophen level, POCT glucose. Will treat patient with 650 mg acetaminophen and 5 mg oxycodone.    ED Course as of 05/04/23 1308   Sat May 03, 2023   1304 Spoke to patients daughter, she does not have much to add and is not sure why the patient is here in the ED.    1404 Patients labs are reassuring thus far. Xray also reassuring. After further discussion patient has run out of her home oxycodone (she has been taking TID instead of BID 2/2 increasing chronic pain). She also attributes her sadness to her relationship with her mother which has been stressful along with the passing of her late husband in 2020.   81 Spoke with psychiatry who will be evaluating the patient by telepsychiatry.        History     Chief Complaint  Chief Complaint   Patient presents with    Pain    Suicidal       HPI   Tonya Wood is a 57 y.o. female with past medical history as below who presents with hypoglycemia. The patient reports progressively worsening hypoglycemia per her BG meter that reached 49 at 2:30 AM today, for which she then took 30 units of insulin because she was confused about her medications. The last time she took insulin was 2:30 AM. She states she drank juice, water, and soda after injecting the insulin. She typically takes 30 units of Lantus once a  day to manage her DM. She also endorses suicidal ideation, but she has no plan to hurt herself and she was not trying to hurt herself by taking insulin. She also reports AVH 2-3 days ago. She has tried to hurt herself 5-6 times in the past but will not tell me about those events.  When asked she states you can look at my records from [place].  She has not taken her psychiatric medication for 3 years. She does not have HI. She also reports chronic left knee and left hip pain, for which she has been taking 1-3 tabs of oxycodone daily with minimal relief.  She further reports bumps on her right ear. She reports she has a history of gout. No recent falls or trauma. Denies fever.    Outside Historian(s): None.    External Records Reviewed: I have reviewed recent and relevant previous record, including: Outpatient notes - 04/15/2023 [facility] Office Visit for patient's knee and hip pain.    I discussed the case with [facility] psychiatry team.     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/28/2021    Odontogenic infection of jaw 12/25/2018    Severe cocaine use disorder (CMS-HCC) 05/07/2019    Smoker 12/27/2019    Unsteady gait 06/02/2015       Past Surgical History:   Procedure Laterality Date    CESAREAN SECTION      FINGER SURGERY Right     ring finger reattached    FOOT SURGERY Right        Allergies  Grass pollen-orchardgrass, standard and Statins-hmg-coa reductase inhibitors    Family History  Family History   Problem Relation Age of Onset    Asthma Mother     Cancer Son     Cancer Other     Breast cancer Neg Hx        Social History  Social History     Tobacco Use    Smoking status: Every Day     Current packs/day: 2.00     Average packs/day: 2.0 packs/day for 37.5 years (75.1 ttl pk-yrs)     Types: Cigarettes     Start date: 10/17/1985    Smokeless tobacco: Never   Vaping Use    Vaping status: Never Used   Substance Use Topics    Alcohol use: No    Drug use: Never        Physical Exam     VITAL SIGNS:      Vitals:    05/03/23 1221   BP: 108/62   Pulse: 89   Resp: 20   Temp: 36.8 ??C (98.2 ??F)   SpO2: 96%     Constitutional: Alert and oriented.  Patient is tearful and upset however nontoxic in appearance  Eyes: Conjunctivae are normal.  HEENT: Normocephalic and atraumatic. Conjunctivae clear. No congestion. Moist mucous membranes.   Cardiovascular: Rate as above, regular rhythm. Normal and symmetric distal pulses. Brisk capillary refill. Normal skin turgor.  Respiratory: Normal respiratory effort. Breath sounds are normal. There are no wheezing or crackles heard.  Gastrointestinal: Soft, non-distended, non-tender.  Genitourinary: Deferred.  Musculoskeletal:  No erythema, joint effusion or warmth overlying the left knee with full passive range of motion of the left knee and hip  Neurologic: Normal speech and language. No gross focal neurologic deficits are appreciated. Patient is moving all extremities equally, face is symmetric at rest and with speech.  Skin: Skin is warm, dry and intact. No rash noted.  Psychiatric: Mood and affect are normal. Speech and behavior are normal.     Radiology     XR Trauma Hip Left   Final Result   No acute osseous abnormalities. 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          Pertinent labs & imaging results that were available during my care of the patient were independently interpreted by me and considered in my medical decision making (see chart for details).    Portions of this record have been created using Scientist, clinical (histocompatibility and immunogenetics). Dictation errors have been sought, but may not have been identified and corrected.      Documentation assistance was provided by [employer] [facility] & Candise Bowens, Scribes, on May 03, 2023 at 12:29 PM for Lemmie Evens, MD.    May 04, 2023 1:13 PM. Documentation assistance provided by the scribe. I was present during the time the encounter was recorded. The information recorded by the scribe was done at my direction and has been reviewed and validated by me.          Ellen Henri, MD  05/04/23 1315

## 2023-05-04 DIAGNOSIS — Z794 Long term (current) use of insulin: Principal | ICD-10-CM

## 2023-05-04 DIAGNOSIS — E1159 Type 2 diabetes mellitus with other circulatory complications: Principal | ICD-10-CM

## 2023-05-05 DIAGNOSIS — M545 Chronic bilateral low back pain without sciatica: Principal | ICD-10-CM

## 2023-05-05 DIAGNOSIS — G8929 Other chronic pain: Principal | ICD-10-CM

## 2023-05-05 DIAGNOSIS — E1159 Type 2 diabetes mellitus with other circulatory complications: Principal | ICD-10-CM

## 2023-05-05 DIAGNOSIS — J441 Chronic obstructive pulmonary disease with (acute) exacerbation: Principal | ICD-10-CM

## 2023-05-05 DIAGNOSIS — Z794 Long term (current) use of insulin: Principal | ICD-10-CM

## 2023-05-05 MED ORDER — LANTUS SOLOSTAR U-100 INSULIN 100 UNIT/ML (3 ML) SUBCUTANEOUS PEN
Freq: Every evening | SUBCUTANEOUS | 0 refills | 75 days
Start: 2023-05-05 — End: ?

## 2023-05-05 MED ORDER — CHOLECALCIFEROL (VITAMIN D3) 1,250 MCG (50,000 UNIT) CAPSULE
ORAL_CAPSULE | ORAL | 0 refills | 168 days | Status: CP
Start: 2023-05-05 — End: ?

## 2023-05-05 MED ORDER — IBUPROFEN 600 MG TABLET
ORAL_TABLET | Freq: Four times a day (QID) | ORAL | 0 refills | 8 days | PRN
Start: 2023-05-05 — End: ?

## 2023-05-05 MED ORDER — ALBUTEROL SULFATE 2.5 MG/3 ML (0.083 %) SOLUTION FOR NEBULIZATION
RESPIRATORY_TRACT | 0 refills | 30 days
Start: 2023-05-05 — End: 2024-05-04

## 2023-05-05 NOTE — Unmapped (Signed)
Last Visit Date: 02/26/2023  Next Visit Date: 07/23/2023  Last seen by [facility]  Lab Results   Component Value Date    Hep B Surface Ag Nonreactive 12/28/2021    Hep B S Ab Reactive (A) 12/28/2021    Hep B Surf Ab Quant 16.06 (H) 12/28/2021    Hep B Core Total Ab Nonreactive 10/16/2022    Hepatitis C Ab Nonreactive 10/16/2022    HIV Antigen/Antibody Combo Nonreactive 12/28/2021        No results found for this or any previous visit.      Results for orders placed during the hospital encounter of 04/09/22    MRI Thoracic Spine Wo Contrast    Narrative  EXAM: Magnetic resonance imaging, spinal canal and contents, thoracic, without contrast material.  DATE: 04/09/2022 10:47 AM  ACCESSION: [id number] UN  DICTATED: 04/09/2022 12:49 PM  INTERPRETATION LOCATION: [facility]    CLINICAL INDICATION: 57 years old Female with Eval for myelopathy ; Myelopathy, chronic, thoracic spine  - M54.50 - Chronic bilateral low back pain without sciatica - G89.29 - Chronic bilateral low back pain without sciatica    COMPARISON: Lumbar spine MRI of 03/25/2022.    TECHNIQUE: Multiplanar MRI was performed through the thoracic spine without contrast administration    FINDINGS:  Bone marrow signal intensity is normal. Normal signal in the spinal cord.    The vertebral bodies are normally aligned. Diffuse mild disc desiccation. Disc spaces are preserved. No significant spinal canal or neural foraminal narrowing.    The paraspinal tissues are within normal limits.    Impression  --Mild degenerative changes of the thoracic spine.    -- No imaging evidence for myelopathy as clinically questioned.      Results for orders placed during the hospital encounter of 05/03/22    MRI Cervical Spine Wo Contrast    Narrative  EXAM: Magnetic resonance imaging, spinal canal and contents, cervical without contrast material.  DATE: 05/03/2022 5:42 PM  ACCESSION: [id number] UN  DICTATED: 05/04/2022 9:07 AM  INTERPRETATION LOCATION: [facility]    CLINICAL INDICATION: 57 years old Female with Eval for myelopathy  - M54.50 - Chronic bilateral low back pain without sciatica - G89.29 - Chronic bilateral low back pain without sciatica. Possible cervical myelopathy with gait imbalance, hand numbness, and difficulty with fine motor skills.    COMPARISON: Cervical spinal radiograph 02/25/2022    TECHNIQUE: Multiplanar multisequence MRI was performed through the cervical spine without intravenous contrast.    FINDINGS:  Exam is moderately limited due to motion artifact/image degradation on the first set of T2/space images and their axial reconstructions.    Bone marrow signal intensity is normal. Patchy T2 hyperintense lesion within the right hemicord at the level of C3 (7:15 and 6:10). Likely additional lesion in the anterior mid pons is incompletely evaluated.    Trace multilevel listhesis, likely degenerative. The vertebral bodies are otherwise normally aligned. Mild disc desiccation throughout the cervical spine with mild loss of disc height most prominent from C4 through C6. There is also slight loss of vertebral body height at C5 and C6. Multilevel discogenic degenerative changes as follows:    C2-C3: Small disc bulge with minimal canal narrowing. No neural foraminal narrowing.    C3-C4: Small disc bulge with mild canal narrowing. No neural foraminal narrowing.    C4-C5: Posterior disc osteophyte with moderate canal narrowing and slight effacement of the cord. Although exam is limited by image degradation, as above, there appears to be severe right and  mild-to-moderate left neural foraminal narrowing.    C5-C6: Small disc bulge with a superimposed left subarticular disc protrusion with mild canal narrowing. Again, within limitations, there appears to be at least mild to moderate bilateral neural foraminal narrowing.    C6-C7: Small disc bulges with minimal canal narrowing. No definite neural foraminal narrowing.    C7-T1: No spinal canal narrowing. No neural foraminal narrowing.    The paraspinal tissues are within normal limits.    Impression  -- Patchy T2 hyperintense lesion within the right cord at the level of C3. Given patient's symptoms and demographics, this finding is suspicious for demyelinating plaque of multiple sclerosis. Lesion in the pons is incompletely evaluated on this cervical spine exam, but could also be compatible with demyelinating disease. Consider dedicated brain MRI for further evaluation.    -- Moderate cervical spondylosis, including mild/moderate canal narrowing, worst at C4-C5. Varying degrees of neural foraminal narrowing, worst at C4-C6.      FOLLOW-UP RECOMMENDATION:    Item for Follow Up:  1. Acuity: Subacute  2. Modality: MR  3. Anatomy: Head  4. TimeFrame: 1-2 weeks

## 2023-05-06 MED ORDER — LANTUS SOLOSTAR U-100 INSULIN 100 UNIT/ML (3 ML) SUBCUTANEOUS PEN
Freq: Every evening | SUBCUTANEOUS | 0 refills | 75 days | Status: CP
Start: 2023-05-06 — End: ?
  Filled 2023-05-15: qty 15, 75d supply, fill #0

## 2023-05-06 MED ORDER — IBUPROFEN 600 MG TABLET
ORAL_TABLET | Freq: Four times a day (QID) | ORAL | 0 refills | 8 days | PRN
Start: 2023-05-06 — End: ?

## 2023-05-06 MED ORDER — ALBUTEROL SULFATE 2.5 MG/3 ML (0.083 %) SOLUTION FOR NEBULIZATION
RESPIRATORY_TRACT | 0 refills | 30 days | Status: CP
Start: 2023-05-06 — End: 2024-05-05
  Filled 2023-05-07: qty 540, 30d supply, fill #0

## 2023-05-07 MED ORDER — IBUPROFEN 600 MG TABLET
ORAL | 0 refills | 8 days | Status: CP | PRN
Start: 2023-05-07 — End: ?
  Filled 2023-05-07: qty 30, 8d supply, fill #0

## 2023-05-07 MED FILL — AMANTADINE HCL 100 MG CAPSULE: ORAL | 30 days supply | Qty: 60 | Fill #1

## 2023-05-07 MED FILL — PREGABALIN 75 MG CAPSULE: ORAL | 30 days supply | Qty: 90 | Fill #1

## 2023-05-07 MED FILL — VARENICLINE 1 MG TABLET: ORAL | 30 days supply | Qty: 30 | Fill #1

## 2023-05-07 MED FILL — NICOTINE (POLACRILEX) 4 MG GUM: ORAL | 5 days supply | Qty: 110 | Fill #1

## 2023-05-07 MED FILL — ACCU-CHEK FASTCLIX LANCET DRUM: 34 days supply | Qty: 102 | Fill #2

## 2023-05-07 MED FILL — ACETAMINOPHEN 500 MG TABLET: ORAL | 34 days supply | Qty: 200 | Fill #1

## 2023-05-07 MED FILL — ACCU-CHEK GUIDE TEST STRIPS: 34 days supply | Qty: 100 | Fill #2

## 2023-05-08 ENCOUNTER — Ambulatory Visit: Admit: 2023-05-08 | Payer: PRIVATE HEALTH INSURANCE

## 2023-05-08 ENCOUNTER — Ambulatory Visit
Admit: 2023-05-08 | Discharge: 2023-05-08 | Disposition: A | Discharge status: Home / Self Care | Payer: PRIVATE HEALTH INSURANCE

## 2023-05-08 LAB — URINALYSIS WITH MICROSCOPY
BACTERIA: NONE SEEN /HPF
BILIRUBIN UA: NEGATIVE
BLOOD UA: NEGATIVE
GLUCOSE UA: >1000 — AB
KETONES UA: NEGATIVE
LEUKOCYTE ESTERASE UA: NEGATIVE
NITRITE UA: NEGATIVE
PH UA: 5.5 (ref 5.0–9.0)
PROTEIN UA: NEGATIVE
RBC UA: 1 /HPF (ref <=4)
SPECIFIC GRAVITY UA: 1.038 — ABNORMAL HIGH (ref 1.003–1.030)
SQUAMOUS EPITHELIAL: <1 /HPF (ref 0–5)
UROBILINOGEN UA: <2
WBC UA: <1 /HPF (ref 0–5)

## 2023-05-08 LAB — CBC W/ AUTO DIFF
BASOPHILS ABSOLUTE COUNT: 0 10*9/L (ref 0.0–0.1)
BASOPHILS RELATIVE PERCENT: 0.4 %
EOSINOPHILS ABSOLUTE COUNT: 0.1 10*9/L (ref 0.0–0.5)
EOSINOPHILS RELATIVE PERCENT: 3.9 %
HEMATOCRIT: 43.1 % (ref 34.0–44.0)
HEMOGLOBIN: 14.5 g/dL (ref 11.3–14.9)
LYMPHOCYTES ABSOLUTE COUNT: 1.3 10*9/L (ref 1.1–3.6)
LYMPHOCYTES RELATIVE PERCENT: 35.6 %
MEAN CORPUSCULAR HEMOGLOBIN CONC: 33.7 g/dL (ref 32.0–36.0)
MEAN CORPUSCULAR HEMOGLOBIN: 30 pg (ref 25.9–32.4)
MEAN CORPUSCULAR VOLUME: 89 fL (ref 77.6–95.7)
MEAN PLATELET VOLUME: 7.9 fL (ref 6.8–10.7)
MONOCYTES ABSOLUTE COUNT: 0.4 10*9/L (ref 0.3–0.8)
MONOCYTES RELATIVE PERCENT: 10.5 %
NEUTROPHILS ABSOLUTE COUNT: 1.8 10*9/L (ref 1.8–7.8)
NEUTROPHILS RELATIVE PERCENT: 49.6 %
PLATELET COUNT: 189 10*9/L (ref 150–450)
RED BLOOD CELL COUNT: 4.84 10*12/L (ref 3.95–5.13)
RED CELL DISTRIBUTION WIDTH: 14 % (ref 12.2–15.2)
WBC ADJUSTED: 3.7 10*9/L (ref 3.6–11.2)

## 2023-05-08 LAB — TOXICOLOGY SCREEN, URINE
AMPHETAMINE SCREEN URINE: NEGATIVE
BARBITURATE SCREEN URINE: NEGATIVE
BENZODIAZEPINE SCREEN, URINE: NEGATIVE
BUPRENORPHINE, URINE SCREEN: NEGATIVE
CANNABINOID SCREEN URINE: NEGATIVE
COCAINE(METAB.)SCREEN, URINE: NEGATIVE
FENTANYL SCREEN, URINE: NEGATIVE
METHADONE SCREEN, URINE: NEGATIVE
OPIATE SCREEN URINE: POSITIVE — AB
OXYCODONE SCREEN URINE: POSITIVE — AB

## 2023-05-08 LAB — COMPREHENSIVE METABOLIC PANEL
ALBUMIN: 4.2 g/dL (ref 3.4–5.0)
ALKALINE PHOSPHATASE: 89 U/L (ref 46–116)
ALT (SGPT): 23 U/L (ref 10–49)
ANION GAP: 5 mmol/L (ref 5–14)
AST (SGOT): 25 U/L (ref <=34)
BILIRUBIN TOTAL: 0.7 mg/dL (ref 0.3–1.2)
BLOOD UREA NITROGEN: 12 mg/dL (ref 9–23)
BUN / CREAT RATIO: 18
CALCIUM: 9.9 mg/dL (ref 8.7–10.4)
CHLORIDE: 105 mmol/L (ref 98–107)
CO2: 31 mmol/L (ref 20.0–31.0)
CREATININE: 0.65 mg/dL
EGFR CKD-EPI (2021) FEMALE: >90 mL/min/{1.73_m2} (ref >=60)
GLUCOSE RANDOM: 141 mg/dL (ref 70–179)
POTASSIUM: 3.3 mmol/L — ABNORMAL LOW (ref 3.4–4.8)
PROTEIN TOTAL: 7.4 g/dL (ref 5.7–8.2)
SODIUM: 141 mmol/L (ref 135–145)

## 2023-05-08 LAB — MAGNESIUM: MAGNESIUM: 1.8 mg/dL (ref 1.6–2.6)

## 2023-05-08 LAB — TSH: THYROID STIMULATING HORMONE: 1.88 u[IU]/mL (ref 0.550–4.780)

## 2023-05-08 LAB — SALICYLATE LEVEL: SALICYLATE LEVEL: <3 mg/dL (ref <=30.0)

## 2023-05-08 LAB — ACETAMINOPHEN LEVEL: ACETAMINOPHEN LEVEL: <2 ug/mL (ref <=20.0)

## 2023-05-08 LAB — LIPASE: LIPASE: 57 U/L — ABNORMAL HIGH (ref 12–53)

## 2023-05-08 LAB — ETHANOL: ETHANOL: <10 mg/dL (ref <=10)

## 2023-05-08 MED ADMIN — pregabalin (LYRICA) capsule 75 mg: 75 mg | ORAL | @ 19:00:00 | Stop: 2023-05-08

## 2023-05-08 MED ADMIN — nicotine polacrilex (NICORETTE) gum 2 mg: 2 mg | BUCCAL | @ 16:00:00 | Stop: 2023-05-08

## 2023-05-08 MED ADMIN — amantadine HCL (SYMMETREL) capsule 100 mg: 100 mg | ORAL | @ 08:00:00 | Stop: 2023-05-08

## 2023-05-08 MED ADMIN — insulin lispro (HumaLOG) injection 0-20 Units: 0-20 [IU] | SUBCUTANEOUS | @ 22:00:00 | Stop: 2023-05-08

## 2023-05-08 MED ADMIN — aspirin chewable tablet 81 mg: 81 mg | ORAL | @ 13:00:00 | Stop: 2023-05-08

## 2023-05-08 MED ADMIN — amantadine HCL (SYMMETREL) capsule 100 mg: 100 mg | ORAL | @ 13:00:00 | Stop: 2023-05-08

## 2023-05-08 MED ADMIN — levothyroxine (SYNTHROID) tablet 100 mcg: 100 ug | ORAL | @ 10:00:00 | Stop: 2023-05-08

## 2023-05-08 MED ADMIN — oxyCODONE (ROXICODONE) immediate release tablet 5 mg: 5 mg | ORAL | @ 08:00:00 | Stop: 2023-05-08

## 2023-05-08 MED ADMIN — fluconazole (DIFLUCAN) tablet 150 mg: 150 mg | ORAL | @ 22:00:00 | Stop: 2023-05-08

## 2023-05-08 MED ADMIN — insulin lispro (HumaLOG) injection 0-20 Units: 0-20 [IU] | SUBCUTANEOUS | @ 16:00:00 | Stop: 2023-05-08

## 2023-05-08 MED ADMIN — nicotine (NICODERM CQ) 21 mg/24 hr patch 1 patch: 1 | TRANSDERMAL | @ 16:00:00 | Stop: 2023-05-08

## 2023-05-08 MED ADMIN — oxyCODONE (ROXICODONE) immediate release tablet 5 mg: 5 mg | ORAL | @ 16:00:00 | Stop: 2023-05-08

## 2023-05-08 MED ADMIN — pregabalin (LYRICA) capsule 75 mg: 75 mg | ORAL | @ 13:00:00 | Stop: 2023-05-08

## 2023-05-08 MED ADMIN — QUEtiapine (SEROQUEL) tablet 200 mg: 200 mg | ORAL | @ 08:00:00 | Stop: 2023-05-08

## 2023-05-08 NOTE — Unmapped (Signed)
Bed: G122  Expected date:   Expected time:   Means of arrival:   Comments:  [place]

## 2023-05-08 NOTE — Unmapped (Signed)
Pt refusing to change into hospital clothing after multiple attempts and continues to curse at staff, MD made aware and states that pt can keep what she has on including glucose monitor on back of arm as well as keep walker in room.

## 2023-05-08 NOTE — Unmapped (Signed)
Patient with personal walker at bedside. 1:1 sitter to be made available. Call bell within reach.

## 2023-05-08 NOTE — Unmapped (Signed)
[facility]   Psychiatry Emergency Service  Initial Consult      Service Date: May 08, 2023  Admit Date/Time: 05/08/2023 12:39 AM  LOS: Total duration of encounter: 1 day   Service requesting consult: Emergency Medicine   Requesting Attending Physician: Clovis Fredrickson, MD  Location of patient and modality: IN-PERSON--[place] [place] [facility]  Consulting Attending: Guillermina City, MD  Consulting Resident/Provider: Eugenia Pancoast, MD     Assessment   Tonya Wood is a 57 y.o., Black/African American race, Not Hispanic, Latino/a, or Spanish origin ethnicity, ENGLISH speaking female with a history of schizoaffective disorder, bipolar type,  MS, HTN, HLD, T2DM, COPD, hypothyroidism PTSD, hx of substance use disorder who presents for evaluation of passive SI.     On presentation patient is emotional concerning situation that brought her into ED as she feels she was misunderstood. Expresses no active SI but endorses anxiety and some paranoia due to chronic and more acute stressors. Feels that she wanted someone to talk to yesterday concerning anxiety so as to guide her concerning outpatient resources and psychiatric follow up in community. Instead was brought to hospital by sheriff after speaking with [phone] which she felt was not needed as she is not having feelings of self harm or suicide. States that she is more overwhelmed due to deteriorating medical condition as well as home life. Endorses various protective factors including grandchildren and mother and shows evidence of planning for future through scheduling medical appointments with primary care while waiting in ED.     Patient's presentation may be acute response to hx of trauma vs chronic depressed state due to underlying medical condition vs complex bereavement. Acute stressors including tense family living situation as well as approaching anniversaries of son's and close friend's death may be contributing.  More chronic stressors of MS diagnosis and chronic pain along with husband's death in 22-May-2019 (which she is still grieving) could also be a component of current presentation.     Patient was offered voluntary admission to inpatient psychiatry but declined at this time. Was counseled on warning signs to return to ED for treatment and endorsed understanding. Patient was also advised to follow up with primary care provider to see if adjustments could be made to current dosing of quetiapine. States intent to establish with [facility] in [place] and was given relevant contact.    Although this patient presented to the ED for passive SI, the patient does not appear to be at imminent risk of dangerousness to self at this time. While future psychiatric events cannot be accurately predicted, the patient does not necessitate nor desire further acute inpatient psychiatric care and it is not felt that inpatient hospitalization would be therapeutic or beneficial for the patient at this time. The patient weighed the risks and benefits of an inpatient admission and voiced understanding of the risks involved in returning to home. The patient responded well to supportive and problem solving therapeutic interventions.        A thorough psychiatric evaluation has been completed including evaluation of the patient, collecting collateral history from daughter, reviewing available medical/clinic records, and discussing treatment recommendations. A suicide and violence risk assessment was performed as part of this evaluation. Specific questioning about thoughts, plans, suicidal intent, and self-harm indicate the patient is NOT at acutely elevated risk of suicide/dangerousness to others and further worsening of psychiatric condition. Risk factors for self-harm/suicide: history of substance abuse and diagnosis of schizoaffective disorder. Protective Factors against self-harm/suicide: lack  of active SI, motivation for treatment, has access to clinical interventions and support, sense of responsibility to family and social supports, expresses purpose for living, and current treatment compliance. Risk Factors for harm to others: high emotional distress and past substance abuse. Protective factors against harm to others: no active symptoms of psychosis and no active symptoms of mania. Inpatient hospitalization is NOT warranted at this time. This was explicitly discussed with patient.      Diagnoses:   Schizoaffective disorder   Hx of substance use   R/o PTSD  R/o MDD due to underlying medical condition     Stressors:   MS diagnosis and chronic pain  Husband's death in 2019/06/07  Anniversary of son's death and friend's death in May 07, 2023  Family issues (housing, disagreements)      Plan   -- Disposition: The Psychiatry Emergency Service treatment team recommends discharge to home with outpatient resources.  The Psychiatry Emergency Service consultant recommends returning to the nearest ED or calling 911 if the patient develops any intent or plan of harming self or others.      -- Observation Level:   This patient had recent ASQ screening performed that rated them as Calculated Risk Score: Imminent Risk   Based on my clinical evaluation, I estimate the patient to be at low risk for suicide in the current setting.  At this time we recommend Q 15 Minute Obs.   This decision is based on my review of the chart including patient's history and current presentation, interview of the patient, mental status examination, and consideration of suicide risk including evaluating suicidal ideation, plan, intent, suicidal or self-harm behaviors, risk factors, and protective factors. This will be reassessed if there is clinically significant change in the status of the patient. This judgment is based on our ability to directly address suicide risk, implement suicide prevention strategies and develop a safety plan while the patient is in the clinical setting.     -- Medication Recommendations:   - Per home regimen     -- Behavioral Recommendations:   - Defer to outpatient follow up   - Given contact information for [facility] to establish care  - Counseled on following up with primary care provider for quetiapine dosing adjustments     -- Resources: Patient was given numbers for providers in their area and Patient was given phone numbers for [employer] and National Suicide Prevention Lifeline .    Thank you for this consult. Should you have any questions regarding the assessment, plan, or recommendations please contact the PES team at pager [phone].     TIME SPENT: 75 minutes    INTERVENTION: risk assessment, crisis planning, care coordination, and safety planning.    Staffed w/ PES MD:  Patient was seen and plan of care was discussed with the PES Attending MD, Guillermina City, MD, who agrees with the above statement and plan.    Subjective:      Reason for consult: passive SI    Per Triage:   Patient states that she needs to be stabilized and put back on medicine. States she is suicidal and doesn't want to feel like that anymore.     Patient reports being transported by police to this hospital instead of [facility]. She reports that she has MS and takes lots of medicine and uses a walker. Patient reports that she doesn't want to live this way anymore, but denies ever saying that she was suicidal.     Per EM Provider:  JAMEAH ROUBIDEAUX is a 57 y.o. 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 presenting for evaluation of depression, suicidal thoughts. Recently seen at [facility] where she reportedly gave herself extra insulin despite being hypoglycemic. She was scheduled for telepsych at that time but left prior to evaluation. Since then she has had worsening mood. She endorses a history of schizophrenia and bipolar 1 but reports not taking meds for these for years. Denies substance use, etoh, HI. States that her family has been using her for her home and bringing animals in despite being allergic. She is tearful.  She tries to be compliant with her home meds but states that she takes over 20 pills per day and can't always keep track.      Of note, when brought to the psych ed she became upset when her assistive devices were removed (walker and glasses). I asked that these be returned to her after my evaluation to ensure her safety.     Pt Interview:   On interview patient is cooperative. Becomes agitated when speaking of circumstances that led to ED admission. States that she was speaking with help line about wanting to begin a medication regimen as she has been more anxious and paranoid lately but denies speaking of suicide with help line. Notes that she was previously at [facility] 3 days ago and was given outpatient resources/ information for when she was ready to seek mental health care. States she was ready to explore options so utilized hotline provided by [facility]. Is frustrated as she is in a lot  of pain physically and has other stressors affecting her. These include her living situation (many family members under same roof) and husband's death in 2019/05/24 which she is still grieving. In addition states that one of her son's passed away some time ago and his death anniversary is approaching on August 29th which have contributed to her sadness.   Notes that all of this has her feeling more on edge and anxious. Endorse hx of AH hallucinations but denies any current AVH. States last time she heard voices was last Thursday and they said do it. Patient did not know what this meant and ignored them.   Patient emphasizes she would like resources and therapy in clinic setting which was her goal when seeking help yesterday. Wants to have psychiatric follow up in [place] as she cannot drive long distances and is only comfortable driving around [place] area. Denies SI/HI. States that she had told help line she doesn't want to live like this out of frustration due to chronic conditions and acute stressors but felt that the arrival of sheriff to take her to hospital was uncalled for her as she has no intentions to harm herself or end her life. Patient states she is used to being the caretaker and does not like having to be taken care of due to various conditions.     Pertinent Negatives: The patient denies SI, HI, and CAH.       COLLATERAL:   Marnette Burgess, daughter [phone]):  Daughter is available via phone call. States that mother has a  stressful living situation as she lives with many family members including daughter. Sates that there is a lot of arguing and drama in the household. Does state that mother's MS diagnosis has been affecting mother's mental state for about a year. States that mother feels alone and not supported.    *Called [facility] crisis team hotline for further information on contents of 988 call but was redirected multiple times. Left call back number and have not received call as of time of this note.    Social History     Socioeconomic History    Marital status: Married   Tobacco Use    Smoking status: Every Day     Current packs/day: 2.00     Average packs/day: 2.0 packs/day for 37.6 years (75.1 ttl pk-yrs)     Types: Cigarettes     Start date: 10/17/1985    Smokeless tobacco: Never   Vaping Use    Vaping status: Never Used   Substance and Sexual Activity    Alcohol use: No    Drug use: Never Sexual activity: Not Currently       Objective:     VS:   Vital Signs  Temp: 36.6 ??C (97.9 ??F)  Temp Source: Oral  Heart Rate: 75  SpO2 Pulse: 67  Heart Rate Source: SpO2  Resp: 21  BP: 112/78  MAP (mmHg): 90  BP Location: Left arm  Patient Position: Lying    Mental Status Exam:  Appearance:    Appears stated age and Well nourished   Behavior:   Cooperative and Direct eye contact   Motor:   No abnormal movements   Speech/Language:    Normal rate, volume, tone, fluency   Mood:    ok   Affect:   Constricted and Mood congruent   Thought process:    Linear, coherent, logical    Thought content:     Denies SI, HI, self harm, delusions, obsessions, paranoid ideation, or ideas of reference   Perceptual disturbances:     Denies auditory and visual hallucinations, behavior not concerning for response to internal stimuli     Orientation:   Oriented to person, place, time, and general circumstances   Attention:   Able to fully attend without fluctuations in consciousness   Concentration:   Able to fully concentrate and attend   Memory:   Immediate, short-term, long-term, and recall grossly intact    Fund of knowledge:    Consistent with level of education and development   Insight:     Fair   Judgment:    Fair   Impulse Control:   Fair      ++SW:++    Please refer to EM provider note for review of any labs, EKG or radiology studies.    ++APP++     ROS:  Unremarkable apart from what is noted above    PHYS:  PHYSICAL EXAM:  General: NAD  Eyes: Sclera clear. No nystagmus or discharge  ENT: Hearing grossly intact  Lungs: Non-labored breathing  Neuro: no tremor observed     Labs: Lab results last 24 hours:    Recent Results (from the past 24 hour(s))   POCT Glucose    Collection Time: 05/08/23  1:12 AM   Result Value Ref Range    Glucose, POC 151 70 - 179 mg/dL   Comprehensive Metabolic Panel    Collection Time: 05/08/23  2:32 AM   Result Value Ref Range    Sodium 141 135 - 145 mmol/L  Potassium 3.3 (L) 3.4 - 4.8 mmol/L Chloride 105 98 - 107 mmol/L    CO2 31.0 20.0 - 31.0 mmol/L    Anion Gap 5 5 - 14 mmol/L    BUN 12 9 - 23 mg/dL    Creatinine 1.47 8.29 - 1.02 mg/dL    BUN/Creatinine Ratio 18     eGFR CKD-EPI (2021) Female >90 >=60 mL/min/1.50m2    Glucose 141 70 - 179 mg/dL    Calcium 9.9 8.7 - 56.2 mg/dL    Albumin 4.2 3.4 - 5.0 g/dL    Total Protein 7.4 5.7 - 8.2 g/dL    Total Bilirubin 0.7 0.3 - 1.2 mg/dL    AST 25 <=13 U/L    ALT 23 10 - 49 U/L    Alkaline Phosphatase 89 46 - 116 U/L   Lipase Level    Collection Time: 05/08/23  2:32 AM   Result Value Ref Range    Lipase 57 (H) 12 - 53 U/L   Salicylate level    Collection Time: 05/08/23  2:32 AM   Result Value Ref Range    Salicylate Lvl <3.0 <=30.0 mg/dL   Acetaminophen level    Collection Time: 05/08/23  2:32 AM   Result Value Ref Range    Acetaminophen Level <2.0 <=20.0 ug/ml   Ethanol    Collection Time: 05/08/23  2:32 AM   Result Value Ref Range    Alcohol, Ethyl <10 <=10 mg/dL   TSH    Collection Time: 05/08/23  2:32 AM   Result Value Ref Range    TSH 1.880 0.550 - 4.780 uIU/mL   COVID PCR    Collection Time: 05/08/23  2:32 AM    Specimen: Nasopharyngeal Swab   Result Value Ref Range    SARS-CoV-2 PCR Negative Negative   CBC w/ Differential    Collection Time: 05/08/23  2:32 AM   Result Value Ref Range    WBC 3.7 3.6 - 11.2 10*9/L    RBC 4.84 3.95 - 5.13 10*12/L    HGB 14.5 11.3 - 14.9 g/dL    HCT 08.6 57.8 - 46.9 %    MCV 89.0 77.6 - 95.7 fL    MCH 30.0 25.9 - 32.4 pg    MCHC 33.7 32.0 - 36.0 g/dL    RDW 62.9 52.8 - 41.3 %    MPV 7.9 6.8 - 10.7 fL    Platelet 189 150 - 450 10*9/L    Neutrophils % 49.6 %    Lymphocytes % 35.6 %    Monocytes % 10.5 %    Eosinophils % 3.9 %    Basophils % 0.4 %    Absolute Neutrophils 1.8 1.8 - 7.8 10*9/L    Absolute Lymphocytes 1.3 1.1 - 3.6 10*9/L    Absolute Monocytes 0.4 0.3 - 0.8 10*9/L    Absolute Eosinophils 0.1 0.0 - 0.5 10*9/L    Absolute Basophils 0.0 0.0 - 0.1 10*9/L   Magnesium    Collection Time: 05/08/23  2:32 AM   Result Value Ref Range    Magnesium 1.8 1.6 - 2.6 mg/dL   POCT Glucose    Collection Time: 05/08/23  8:44 AM   Result Value Ref Range    Glucose, POC 80 70 - 179 mg/dL   POCT Glucose    Collection Time: 05/08/23 11:53 AM   Result Value Ref Range    Glucose, POC 205 (H) 70 - 179 mg/dL       Labs reviewed, unremarkable with exception of:  EKG: Ordered 8/22; not obtained as of yet    Imaging: None    ++Safety Note: All patients awaiting psychiatric evaluation and/or disposition in the ED shall be placed in a locked behavioral health area as space permits. When in this setting they will be monitored via live video feed++

## 2023-05-08 NOTE — Unmapped (Signed)
Patient reports being transported by police to this hospital instead of [facility]. She reports that she has MS and takes lots of medicine and uses a walker. Patient reports that she doesn't want to live this way anymore, but denies ever saying that she was suicidal.
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
## 2023-05-08 NOTE — Unmapped (Signed)
Patient states that she needs to be stabilized and put back on medicine.  States she is suicidal and doesn't want to feel like that anymore.

## 2023-05-09 NOTE — Unmapped (Signed)
Medication Request     Patient Name: Tonya Wood   Caller: Self (Patient)  Name of Caller:  Have you contacted your pharmacy? yes      Last Visit: 04/15/2023       Medication Name: oxyCODONE (ROXICODONE)   Dosage:5 MG immediate release tablet   Route: Oral (PO)  Frequency: Daily  Day Supply Requested: 30  Pharmacy (Name & Address): [facility]   [address], [place] [address]    Pharmacy Phone Number: [phone]

## 2023-05-09 NOTE — Unmapped (Signed)
Patient discharge at this time. Discharge instructions reviewed and patient verbalized understanding. Patient is calm and cooperative at time of discharge, denies SI/HI/AH/VH. Personal belongings returned to patient and confirmed to be all there.

## 2023-05-09 NOTE — Unmapped (Signed)
PT is wanting a call from a nurse to look into the medication she is requesting to be refilled. PT is calling again to speak to nurse about medication.Call back number is [phone].

## 2023-05-10 ENCOUNTER — Ambulatory Visit: Admit: 2023-05-10 | Discharge: 2023-05-11 | Payer: PRIVATE HEALTH INSURANCE

## 2023-05-10 MED ORDER — CYCLOBENZAPRINE 5 MG TABLET
ORAL_TABLET | Freq: Three times a day (TID) | ORAL | 0 refills | 30 days | Status: CP
Start: 2023-05-10 — End: 2023-06-09
  Filled 2023-05-10: qty 90, 30d supply, fill #0

## 2023-05-10 MED ORDER — OXYCODONE 5 MG TABLET
ORAL_TABLET | Freq: Three times a day (TID) | ORAL | 0 refills | 4 days | Status: CP | PRN
Start: 2023-05-10 — End: ?
  Filled 2023-05-10: qty 12, 4d supply, fill #0

## 2023-05-10 NOTE — Unmapped (Signed)
[facility] CLINIC NOTE      05/10/2023    PCP: Kirby Crigler, MD     -----------------------------------------------------------  SUBJECTIVE:  Chief Complaint   Patient presents with    ms flare up     Body pain       HPI:  Tonya Wood is a 57 y.o. female who presents to clinic today regarding the following issues:    ##   Pain  Chronic, but worse in past couple days  Recent visit to ED 2 days ago for pain and SI (currently denying SI)  Ran out of oxycodone because she has had to take it TID instead of BID for the past couple weeks  Ran out of flexeril    ## MS  Denies weakness, numbness, tingling  No acute visual changes, HA  No new bladder incontinence  Follows with [facility] neurology    Review of Systems    See HPI for other pertinent ROS.      -----------------------------------------------------------  Past, Family and Social History:  reviewed    ___________________________________  CURRENT MEDS:  Current Outpatient Medications   Medication Sig Dispense Refill    acetaminophen (TYLENOL EXTRA STRENGTH) 500 MG tablet Take 2 tablets (1,000 mg total) by mouth Three (3) times a day. 200 tablet 11    albuterol 2.5 mg /3 mL (0.083 %) nebulizer solution Inhale 1 vial (2.5 mg total) by nebulization every four (4) hours. 540 mL 0    amantadine HCL (SYMMETREL) 100 mg capsule Take 1 capsule (100 mg total) by mouth two (2) times a day. 60 capsule 5    amlodipine (NORVASC) 10 MG tablet Take 1 tablet (10 mg total) by mouth daily. 90 tablet 3    aspirin (ECOTRIN) 81 MG tablet Take 1 tablet (81 mg total) by mouth daily. 90 tablet 3    blood sugar diagnostic (GLUCOSE BLOOD) Strp Test blood glucose 3 times daily. 50 strip 11    blood-glucose sensor (FREESTYLE LIBRE 3 SENSOR) Devi Use to monitor blood glucose levels continuously. Change sensor every 14 days. 6 each 3    cetirizine (ZYRTEC) 10 MG tablet Take 1 tablet (10 mg total) by mouth daily. 90 tablet 3    chlorhexidine (PERIDEX) 0.12 % solution Swish and spit out 15 mls two (2) times a day. 2365 mL 3    cholecalciferol, vitamin D3-1,250 mcg, 50,000 unit,, 1,250 mcg (50,000 unit) capsule Take 1 capsule (1,250 mcg total) by mouth once a week. 24 capsule 0    colchicine (COLCRYS) 0.6 mg tablet Take 1 tablet (0.6 mg total) by mouth daily. 90 tablet 3    cyclobenzaprine (FLEXERIL) 5 MG tablet Take 1 tablet (5 mg total) by mouth Three (3) times a day. 90 tablet 0    diclofenac sodium (VOLTAREN) 1 % gel Apply 2 g topically four (4) times a day. 200 g 6    dimethyl fumarate 240 mg CpDR Take 1 capsule (240 mg total) by mouth two (2) times a day. 180 capsule 1    dulaglutide (TRULICITY) 1.5 mg/0.5 mL PnIj Inject the contents of 1 pen (1.5 mg total) under the skin every seven (7) days. 6 mL 3    DULoxetine (CYMBALTA) 60 MG capsule Take 1 capsule (60 mg total) by mouth daily. 90 capsule 3    empagliflozin (JARDIANCE) 25 mg tablet Take 1 tablet (25 mg total) by mouth daily. 90 tablet 3    ezetimibe (ZETIA) 10 mg  tablet Take 1 tablet (10 mg total) by mouth daily. 90 tablet 3    fluticasone propionate (FLONASE) 50 mcg/actuation nasal spray Use 1 spray into each nostril daily. 16 g 11    ibuprofen (MOTRIN) 600 MG tablet Take 1 tablet (600 mg total) by mouth every six (6) hours as needed for pain. 30 tablet 0    inhaler, assist devices (OPTICHAMBER, AEROCHAMBER, ADULT) Spcr Use as directed with inhalers 1 each 0    insulin glargine (LANTUS SOLOSTAR U-100 INSULIN) 100 unit/mL (3 mL) injection pen Inject 0.2 mL (20 Units total) under the skin nightly. 15 mL 0    ipratropium-albuterol (DUO-NEB) 0.5-2.5 mg/3 mL nebulizer Inhale 3 mL (contents of one nebule) by nebulization every six (6) hours as needed. 180 mL 3    lancets Misc Test blood glucose 3 times daily. 100 each 11    levothyroxine (SYNTHROID) 100 MCG tablet Take 1 tablet (100 mcg total) by mouth daily. 90 tablet 0    lidocaine-prilocaine (EMLA) 2.5-2.5 % cream Apply topically Three (3) times a day as needed. 30 g 0    lisinopril (PRINIVIL,ZESTRIL) 20 MG tablet Take 1 tablet (20 mg total) by mouth daily. 90 tablet 3    magnesium oxide (MAG-OX) 400 mg (241.3 mg elemental magnesium) tablet Take 1 tablet (400 mg total) by mouth daily. 30 tablet 11    naloxone (NARCAN) 4 mg nasal spray Give single spray in one nostril.  Repeat with 2nd device in other nostril every 3 min if no or minimal response until 911 arrives 2 each 11    nicotine (NICODERM CQ) 21 mg/24 hr patch Place 1 patch on the skin daily. 28 patch 2    nicotine polacrilex (NICORETTE) 4 MG gum Chew 1 piece of gum (4 mg total) by mouth every hour as directed as needed for smoking cessation. 110 each 3    ondansetron (ZOFRAN) 4 MG tablet Take 1 tablet (4 mg total) by mouth daily as needed for nausea. 30 tablet 1    oxyCODONE (ROXICODONE) 5 MG immediate release tablet Take 1 tablet (5 mg total) by mouth every eight (8) hours as needed for pain for up to 12 doses. 12 tablet 0    pen needle, diabetic (PEN NEEDLE) 31 gauge x 5/16 (8 mm) Ndle Injection Frequency is 1 time per day 100 each 11    pregabalin (LYRICA) 75 MG capsule Take 1 capsule (75 mg total) by mouth Three (3) times a day. 270 capsule 3    QUEtiapine (SEROQUEL) 200 MG tablet Take 1 tablet (200 mg total) by mouth nightly. 90 tablet 3    umeclidinium-vilanterol (ANORO ELLIPTA) 62.5-25 mcg/actuation inhaler Inhale 1 puff daily.      varenicline (CHANTIX) 1 mg tablet Take 1 tablet (1 mg total) by mouth daily. 30 tablet 3    VENTOLIN HFA 90 mcg/actuation inhaler Inhale 2 puffs by mouth every six (6) hours as needed for wheezing. 18 g 5    blood-glucose meter,continuous (FREESTYLE LIBRE 3 READER) Misc Use as directed to test blood sugar 1 each 0     No current facility-administered medications for this visit.       ___________________________________  ALLERGIES:  Allergies   Allergen Reactions    Grass Pollen-Orchardgrass, Standard     Statins-Hmg-Coa Reductase Inhibitors      Hold statin due to elevated CK       -----------------------------------------------------------  OBJECTIVE:    Physical Exam  Vitals: BP 95/67 (BP Site: L Arm, BP Position:  Sitting, BP Cuff Size: Medium)  - Pulse 84  - Temp 36.7 ??C (98 ??F)  - Ht 170.2 cm (5' 7)  - Wt 89.8 kg (198 lb)  - BMI 31.01 kg/m??   General Apperance: no obvious deformities, comfortable and no acute distress  Eyes: pink conjunctivae, anicteric sclerae, lids clear, pupils equal, round, reacting to light and accommodation and extraocular muscles intact  ENT / Mouth: ears and nose without external deformities, masses, or lesions, EACs clear, no inflammation, TMs intact/shiny without inflammation, hearing grossly intact, nasal mucosa, septum, and turbinates normal, no sinus tenderness, oropharynx without any erythema or exudate and mucous membranes moist, no oral lesions  Neck: neck symmetric, trachea midline, no masses and no thyromegaly, thyroid tenderness or masses  Respiratory: normal respiratory effort and clear to auscultation bilaterally  Cardiovascular: Pulse-normal rate, regularity and rhythm, S1, S2 normal w/o murmur, rub, or gallop and no edema  Chest / Breast: no chest/breast asymmetry, skin changes  Gastrointestinal: abdomen soft, non-tender and non-distended, no masses and no noticeable hernia  Lymphatic: no cervical adenopathy  Musculoskeletal: normal gait and station and no clubbing, cyanosis, effusion  Skin: no rashes or significant lesions  Neurologic: no focal neurological deficits, moves all extremities well and no involuntary movements  Psychiatric: normal mood, appropriate affect and normal judgment and insight      LABS:  Admission on 05/08/2023, Discharged on 05/08/2023   Component Date Value Ref Range Status    Glucose, POC 05/08/2023 151  70 - 179 mg/dL Final    Sodium 16/06/9603 141  135 - 145 mmol/L Final    Potassium 05/08/2023 3.3 (L)  3.4 - 4.8 mmol/L Final    Chloride 05/08/2023 105  98 - 107 mmol/L Final    CO2 05/08/2023 31.0  20.0 - 31.0 mmol/L Final    Anion Gap 05/08/2023 5  5 - 14 mmol/L Final    BUN 05/08/2023 12  9 - 23 mg/dL Final    Creatinine 54/05/8118 0.65  0.55 - 1.02 mg/dL Final    BUN/Creatinine Ratio 05/08/2023 18   Final    eGFR CKD-EPI (2021) Female 05/08/2023 >90  >=60 mL/min/1.67m2 Final    Glucose 05/08/2023 141  70 - 179 mg/dL Final    Calcium 14/78/2956 9.9  8.7 - 10.4 mg/dL Final    Albumin 21/30/8657 4.2  3.4 - 5.0 g/dL Final    Total Protein 05/08/2023 7.4  5.7 - 8.2 g/dL Final    Total Bilirubin 05/08/2023 0.7  0.3 - 1.2 mg/dL Final    AST 84/69/6295 25  <=34 U/L Final    ALT 05/08/2023 23  10 - 49 U/L Final    Alkaline Phosphatase 05/08/2023 89  46 - 116 U/L Final    Lipase 05/08/2023 57 (H)  12 - 53 U/L Final    Color, UA 05/08/2023 Light Yellow   Final    Clarity, UA 05/08/2023 Clear   Final    Specific Gravity, UA 05/08/2023 1.038 (H)  1.003 - 1.030 Final    pH, UA 05/08/2023 5.5  5.0 - 9.0 Final    Leukocyte Esterase, UA 05/08/2023 Negative  Negative Final    Nitrite, UA 05/08/2023 Negative  Negative Final    Protein, UA 05/08/2023 Negative  Negative Final    Glucose, UA 05/08/2023 >1000 mg/dL (A)  Negative Final    Ketones, UA 05/08/2023 Negative  Negative Final    Urobilinogen, UA 05/08/2023 <2.0 mg/dL  <2.8 mg/dL Final    Bilirubin, UA 05/08/2023 Negative  Negative  Final    Blood, UA 05/08/2023 Negative  Negative Final    RBC, UA 05/08/2023 1  <=4 /HPF Final    WBC, UA 05/08/2023 <1  0 - 5 /HPF Final    Squam Epithel, UA 05/08/2023 <1  0 - 5 /HPF Final    Bacteria, UA 05/08/2023 None Seen  None Seen /HPF Final    Mucus, UA 05/08/2023 Rare (A)  None Seen /HPF Final    Amphetamines Screen, Ur 05/08/2023 Negative  <500 ng/mL Final    Barbiturates Screen, Ur 05/08/2023 Negative  <200 ng/mL Final    Benzodiazepines Screen, Urine 05/08/2023 Negative  <200 ng/mL Final    Cannabinoids Screen, Ur 05/08/2023 Negative  <20 ng/mL Final    Methadone Screen, Urine 05/08/2023 Negative  <300 ng/mL Final    Cocaine(Metab.)Screen, Urine 05/08/2023 Negative  <150 ng/mL Final    Opiates Screen, Ur 05/08/2023 Positive (A)  <300 ng/mL Final    Fentanyl Screen, Ur 05/08/2023 Negative  <1.0 ng/mL Final    Oxycodone Screen, Ur 05/08/2023 Positive (A)  <100 ng/mL Final    Buprenorphine, Urine 05/08/2023 Negative  <5 ng/mL Final    Salicylate Lvl 05/08/2023 <3.0  <=30.0 mg/dL Final    Acetaminophen Level 05/08/2023 <2.0  <=20.0 ug/ml Final    Alcohol, Ethyl 05/08/2023 <10  <=10 mg/dL Final    TSH 16/06/9603 1.880  0.550 - 4.780 uIU/mL Final    SARS-CoV-2 PCR 05/08/2023 Negative  Negative Final    WBC 05/08/2023 3.7  3.6 - 11.2 10*9/L Final    RBC 05/08/2023 4.84  3.95 - 5.13 10*12/L Final    HGB 05/08/2023 14.5  11.3 - 14.9 g/dL Final    HCT 54/05/8118 43.1  34.0 - 44.0 % Final    MCV 05/08/2023 89.0  77.6 - 95.7 fL Final    MCH 05/08/2023 30.0  25.9 - 32.4 pg Final    MCHC 05/08/2023 33.7  32.0 - 36.0 g/dL Final    RDW 14/78/2956 14.0  12.2 - 15.2 % Final    MPV 05/08/2023 7.9  6.8 - 10.7 fL Final    Platelet 05/08/2023 189  150 - 450 10*9/L Final    Neutrophils % 05/08/2023 49.6  % Final    Lymphocytes % 05/08/2023 35.6  % Final    Monocytes % 05/08/2023 10.5  % Final    Eosinophils % 05/08/2023 3.9  % Final    Basophils % 05/08/2023 0.4  % Final    Absolute Neutrophils 05/08/2023 1.8  1.8 - 7.8 10*9/L Final    Absolute Lymphocytes 05/08/2023 1.3  1.1 - 3.6 10*9/L Final    Absolute Monocytes 05/08/2023 0.4  0.3 - 0.8 10*9/L Final    Absolute Eosinophils 05/08/2023 0.1  0.0 - 0.5 10*9/L Final    Absolute Basophils 05/08/2023 0.0  0.0 - 0.1 10*9/L Final    Magnesium 05/08/2023 1.8  1.6 - 2.6 mg/dL Final    Glucose, POC 21/30/8657 80  70 - 179 mg/dL Final    Glucose, POC 84/69/6295 205 (H)  70 - 179 mg/dL Final    Glucose, POC 28/41/3244 193 (H)  70 - 179 mg/dL Final       STUDIES:  No results found.    -----------------------------------------------------------  ASSESSMENT/PLAN:    57 y.o. female with medical hx notable for MS, T2DM, hypothyroidism, HTN, here for:    ##   Chronic pain  Worsened in past couple weeks. I am not concerned for MS flare at this time given no neurologic complaints,  normal neuro exam. Pain is in typical location of arms and knees. She lives with her mother and needs more support at home. Has appt with provider here in 3 days along with social work to get established with [facility] resources. Encouraged use of lidocaine patches, voltaren gel. Will refill her flexeril. Patient out of oxycodone as well. PDMP reviewed and verified patient has been taking TID instead of BID. Will provide enough for her to continue taking TID until her follow up appt in 3 days, at which point pain mgmt plan can be reviewed by PCP.  - flexeril 5mg  TID prn  - oxycodone 5mg  - 12 tabs      RTC: in 3 days     -----------------------------------------------------------      [facility]  [address] [place], [place] [address]  Telephone [phone]  Fax [phone]  [email or web address]

## 2023-05-10 NOTE — Unmapped (Signed)
Upcoming Appt:  Future Appointments   Date Time Provider Department Center   05/13/2023  3:00 PM [facility] TRANSITION CLINIC [facility]   05/13/2023  3:40 PM Bernette Redbird, MD [facility]   05/15/2023  1:40 PM Kirby Crigler, MD [facility]   05/20/2023 11:00 AM Kirby Crigler, MD [facility]   06/02/2023  9:30 AM Mellody Life, Nilda Simmer, AGNP [facility]   06/02/2023 11:40 AM [facility]   06/02/2023  1:30 PM Thi Evern Bio, FNP [facility]   07/01/2023  8:00 AM [facility] RM 2 [facility] [place] [facility]   07/15/2023  1:00 PM Titus Dubin, FNP [id number]   07/23/2023  1:00 PM Zelasky, Trixie Deis, PA [facility]       Recent:   What is the date of your last related visit?  05-03-23 MS symptoms and low BS  Related acute medications Rx'd:  oxycodone  Home treatment tried:  ice      Relevant:   Allergies: Grass pollen-orchardgrass, standard and Statins-hmg-coa reductase inhibitors  Medications: n/a   Health History: Schizoaffective disorder,substance abuse disorder,  Weight: n/a      /[place] [facility] patients only:  What was the date of your last cancer treatment (mm/dd/yy)?: n/a  Was the treatment oral or infusion?: n/a  Are you currently on TVEC (yes/no)?: n/a  Reason for Disposition   Caller requesting a CONTROLLED substance prescription refill (e.g., narcotics, ADHD medicines)   [1] Very swollen joint AND [2] no fever    Additional Information   Negative: [1] Can't move swollen joint at all AND [2] no fever     Pt has not been able to bend her knee in a long time.Nothing new    Answer Assessment - Initial Assessment Questions  1. DRUG NAME: What medicine do you need to have refilled?      Oxycodone  2. REFILLS REMAINING: How many refills are remaining? (Note: The label on the medicine or pill bottle will show how many refills are remaining. If there are no refills remaining, then a renewal may be needed.)      None pt should have some pills left from the 04-15-23 refill.pt reports she had to take more due to pain and MD in hospital was supposed to get up with pcp to explain.  3. EXPIRATION DATE: What is the expiration date? (Note: The label states when the prescription will expire, and thus can no longer be refilled.)      unsure  4. PRESCRIBING HCP: Who prescribed it? Reason: If prescribed by specialist, call should be referred to that group.      Dr Loleta Chance  5. SYMPTOMS: Do you have any symptoms?      Chronic pain in neck,shoulder,mouth and knee pain.Pt reports severe pain  6. PREGNANCY: Is there any chance that you are pregnant? When was your last menstrual period?      N/A    Answer Assessment - Initial Assessment Questions  1. LOCATION and RADIATION: Where is the pain located?       Left knee  2. QUALITY: What does the pain feel like?  (e.g., sharp, dull, aching, burning)      Pt reports burning,and sharp  3. SEVERITY: How bad is the pain? What does it keep you from doing?   (Scale 1-10; or mild, moderate, severe)    -  MILD (1-3): doesn't interfere with normal activities     -  MODERATE (4-7): interferes with normal activities (e.g., work or school) or awakens from sleep, limping     -  SEVERE (8-10): excruciating pain, unable to do any normal activities, unable to walk      Pt reports severe  4. ONSET: When did the pain start? Does it come and go, or is it there all the time?      Pt reports she has been dealing with this pain and needs a knee replacement  5. RECURRENT: Have you had this pain before? If Yes, ask: When, and what happened then?      Yes 09/2022 and has gotten worse the last 5months  6. SETTING: Has there been any recent work, exercise or other activity that involved that part of the body?       Pt reports she needs a knee replacement  7. AGGRAVATING FACTORS: What makes the knee pain worse? (e.g., walking, climbing stairs, running)      Pt reports walking  8. ASSOCIATED SYMPTOMS: Is there any swelling or redness of the knee?      Pt reports swelling,3 times as bigger.  9. OTHER SYMPTOMS: Do you have any other symptoms? (e.g., chest pain, difficulty breathing, fever, calf pain)      Pt reports shoulder pain and mouth pain  10. PREGNANCY: Is there any chance you are pregnant? When was your last menstrual period?        N/A      Pt has pain in multiple areas.Pt has MS,pt reports knee pain is the worse.    Protocols used: Medication Refill and Renewal Call-A-AH, Knee Pain-A-AH

## 2023-05-13 ENCOUNTER — Ambulatory Visit: Admit: 2023-05-13 | Discharge: 2023-05-14 | Payer: PRIVATE HEALTH INSURANCE

## 2023-05-13 MED ORDER — OXYCODONE 5 MG TABLET
ORAL_TABLET | Freq: Two times a day (BID) | ORAL | 0 refills | 15 days | Status: CP | PRN
Start: 2023-05-13 — End: 2023-05-28
  Filled 2023-05-13: qty 30, 15d supply, fill #0

## 2023-05-13 MED ORDER — DIVALPROEX ER 500 MG TABLET,EXTENDED RELEASE 24 HR
ORAL_TABLET | Freq: Every day | ORAL | 1 refills | 30 days | Status: CP
Start: 2023-05-13 — End: 2024-05-12
  Filled 2023-05-13: qty 30, 30d supply, fill #0

## 2023-05-13 MED ORDER — ONDANSETRON HCL 4 MG TABLET
ORAL_TABLET | Freq: Every day | ORAL | 1 refills | 30 days | Status: CP | PRN
Start: 2023-05-13 — End: 2024-05-12
  Filled 2023-05-15: qty 30, 30d supply, fill #0

## 2023-05-13 MED ORDER — AMLODIPINE 10 MG-BENAZEPRIL 20 MG CAPSULE
ORAL | 5 refills | 30 days | Status: CP
Start: 2023-05-13 — End: 2024-05-12
  Filled 2023-05-13: qty 30, 30d supply, fill #0

## 2023-05-13 MED ORDER — CHOLECALCIFEROL (VITAMIN D3) 1,250 MCG (50,000 UNIT) CAPSULE
ORAL_CAPSULE | ORAL | 0 refills | 168 days | Status: CP
Start: 2023-05-13 — End: ?
  Filled 2023-06-19: qty 24, 168d supply, fill #0

## 2023-05-13 MED ORDER — TRELEGY ELLIPTA 100 MCG-62.5 MCG-25 MCG POWDER FOR INHALATION
RESPIRATORY_TRACT | 11 refills | 60 days | Status: CP
Start: 2023-05-13 — End: ?

## 2023-05-13 NOTE — Unmapped (Addendum)
Thank you for attending your hospital follow-up appointment today. Here is a summary of our visit.    Instructions from our visit today:  Simplifying medications  Stop magnesium  Stop colchicine  Stop Chantix  Stop amlodipine and lisinopril - start combo pill amlodipine/benazepril (Lotrel) 10/20 mg 1 tablet daily  Pain  Refill oxycodone to bridge until you see Dr. Loleta Chance - discuss trying a once weekly Butrans patch in the future  Mood  Restart Depakote ER 500 mg tablet once daily - can consider increasing up to 2 tablets (1000 mg) daily at next visit with Dr. Loleta Chance if there are no concerns after restarting it  Inhalers  Stop Anoro Ellipta  Start Trelegy Ellipta 1 puff once daily    Social Work Resources:   Nurse, mental health C. Elisabeth Pigeon  Care Manager  [facility]  Ph: [phone]    Knute Neu, LCSW  [phone]    If you need to schedule an appointment or get a message to your provider:  Please go to [email or web address] and sign in to your [facility] Chart or call us at [phone].     If you need to request medication refills:  Please request a refill via [email or web address], or have your pharmacy send a request electronically or by sending a fax to [phone].    If you have urgent healthcare needs after normal business hours, on weekends, or during holidays:  We have extended hours available on Monday, Tuesday, and Thursday (5-7pm). Call [phone] to schedule an appointment.   [facility] provides extended hours and greater access to care for all patients. Run by [facility] of [facility], we offer walk-in care for health issues that do not require a trip to the emergency room. No appointment needed.   Sunday: 12:00PM to 5:00PM  Monday - Friday: 8:00AM to 7:00PM  Saturday: 12:00PM to 5:00PM  Call the [facility] 24/7 Nursing Line at [phone] to get nurse advice.

## 2023-05-13 NOTE — Unmapped (Signed)
Reason for Visit: Hospital Follow-up Medication Management    History of Present Illness:  Tonya Wood is a 57 y.o. female with a past medical history of depression, schizoaffective disorder (bipolar type), MS, HTN, HLD, T2DM, COPD, tobacco use, hypothyroidism, PTSD, prior SUD who was recently seen in the ED on 05/03/23 and 05/08/23 for passive SI. Pt presents to the [facility] Care [facility] for follow-up without all of her medication bottles.    Since discharge, patient has been having difficulty keeping food down and endorses acute on chronic pain of her mouth, upper back, and lower extremities. Pain has been ongoing but worsened in last 2 months. Current pain regimen providing little benefit. Reports typically taking 4 of the oxycodone 5 mg tablets a day and requests prescription for increased dose of oxycodone (currently prescribed for BID).    Reports paranoia, auditory hallucinations, and agitation despite being on Seroquel. Does not want to go up her Seroquel dose (causes sedation) nor see psychiatry. Shares she was previously on Depakote, which helped with her mood. Completed the PHQ-9 questionnaire with a score of 24, declining to answer last question.    Did not bring Freestyle Libre CGM reader to visit today, though reports using routinely at home. Had episode of hypoglycemia (BG 49) that led to an ED visit on 05/03/23. No med changes were made to DM meds, but pt said hypoglycemic episode was in the setting of poor PO intake. Recalled recent BG readings being in the 100s and no lows since then.     Medication Adherence and Access:  Missed doses?: no  Uses pillbox?: yes  Anyone else assist with medication organization? yes, mom   Current insurance coverage: [employer]   Preferred Pharmacy: [facility]    Medications affordable?: yes  Needs refills? yes - vitamin D3, Zofran     Allergies:   Allergies   Allergen Reactions    Grass Pollen-Orchardgrass, Standard Statins-Hmg-Coa Reductase Inhibitors      Hold statin due to elevated CK       Medications: Medications reviewed in EPIC medication station and updated today by the clinical pharmacist practitioner.  Current Outpatient Medications on File Prior to Visit   Medication Sig Dispense    acetaminophen (TYLENOL EXTRA STRENGTH) 500 MG tablet Take 2 tablets (1,000 mg total) by mouth Three (3) times a day. Taking    albuterol 2.5 mg /3 mL (0.083 %) nebulizer solution Inhale 1 vial (2.5 mg total) by nebulization every four (4) hours. PRN    amantadine HCL (SYMMETREL) 100 mg capsule Take 1 capsule (100 mg total) by mouth two (2) times a day. Taking    amlodipine (NORVASC) 10 MG tablet Take 1 tablet (10 mg total) by mouth daily. Taking    aspirin (ECOTRIN) 81 MG tablet Take 1 tablet (81 mg total) by mouth daily. Taking - pt previously has reported past history of stroke    cetirizine (ZYRTEC) 10 MG tablet Take 1 tablet (10 mg total) by mouth daily. Taking    chlorhexidine (PERIDEX) 0.12 % solution Swish and spit out 15 mls two (2) times a day. Unknown    cholecalciferol, vitamin D3-1,250 mcg, 50,000 unit,, 1,250 mcg (50,000 unit) capsule Take 1 capsule (1,250 mcg total) by mouth once a week. Taking - requests refill    colchicine (COLCRYS) 0.6 mg tablet Take 1 tablet (0.6 mg total) by mouth daily. Taking    cyclobenzaprine (FLEXERIL) 5 MG tablet Take 1 tablet (5 mg total) by  mouth Three (3) times a day. Taking    diclofenac sodium (VOLTAREN) 1 % gel Apply 2 g topically four (4) times a day. Taking    dimethyl fumarate 240 mg CpDR Take 1 capsule (240 mg total) by mouth two (2) times a day. Taking    dulaglutide (TRULICITY) 1.5 mg/0.5 mL PnIj Inject the contents of 1 pen (1.5 mg total) under the skin every seven (7) days. Taking    DULoxetine (CYMBALTA) 60 MG capsule Take 1 capsule (60 mg total) by mouth daily. Taking    empagliflozin (JARDIANCE) 25 mg tablet Take 1 tablet (25 mg total) by mouth daily. Taking    ezetimibe (ZETIA) 10 mg tablet Take 1 tablet (10 mg total) by mouth daily. Taking    fluticasone propionate (FLONASE) 50 mcg/actuation nasal spray Use 1 spray into each nostril daily. Taking    ibuprofen (MOTRIN) 600 MG tablet Take 1 tablet (600 mg total) by mouth every six (6) hours as needed for pain. PRN    insulin glargine (LANTUS SOLOSTAR U-100 INSULIN) 100 unit/mL (3 mL) injection pen Inject 0.2 mL (20 Units total) under the skin nightly. Taking    ipratropium-albuterol (DUO-NEB) 0.5-2.5 mg/3 mL nebulizer Inhale 3 mL (contents of one nebule) by nebulization every six (6) hours as needed. PRN    levothyroxine (SYNTHROID) 100 MCG tablet Take 1 tablet (100 mcg total) by mouth daily. Taking    lidocaine-prilocaine (EMLA) 2.5-2.5 % cream Apply topically Three (3) times a day as needed. PRN    lisinopril (PRINIVIL,ZESTRIL) 20 MG tablet Take 1 tablet (20 mg total) by mouth daily. Taking    magnesium oxide (MAG-OX) 400 mg (241.3 mg elemental magnesium) tablet Take 1 tablet (400 mg total) by mouth daily. Taking    naloxone (NARCAN) 4 mg nasal spray Give single spray in one nostril.  Repeat with 2nd device in other nostril every 3 min if no or minimal response until 911 arrives PRN - not needed recently    nicotine (NICODERM CQ) 21 mg/24 hr patch Place 1 patch on the skin daily. Taking    nicotine polacrilex (NICORETTE) 4 MG gum Chew 1 piece of gum (4 mg total) by mouth every hour as directed as needed for smoking cessation. PRN - taking occasionally    ondansetron (ZOFRAN) 4 MG tablet Take 1 tablet (4 mg total) by mouth daily as needed for nausea. PRN - requests refill    oxyCODONE (ROXICODONE) 5 MG immediate release tablet Take 1 tablet (5 mg total) by mouth every eight (8) hours as needed for pain for up to 12 doses. Taking differently than chronic Rx - taking QID    pregabalin (LYRICA) 75 MG capsule Take 1 capsule (75 mg total) by mouth Three (3) times a day. Taking    QUEtiapine (SEROQUEL) 200 MG tablet Take 1 tablet (200 mg total) by mouth nightly. Taking    umeclidinium-vilanterol (ANORO ELLIPTA) 62.5-25 mcg/actuation inhaler Inhale 1 puff daily. Taking    varenicline (CHANTIX) 1 mg tablet Take 1 tablet (1 mg total) by mouth daily. Taking - unsure if providing benefit    VENTOLIN HFA 90 mcg/actuation inhaler Inhale 2 puffs by mouth every six (6) hours as needed for wheezing. PRN       Assessment and Plan:     #Schizoaffective Disorder, Bipolar, MDD: Patient with symptoms of psychosis and depression, uncontrolled on Seroquel 200 mg nightly. Was previously on Depakote 1000 mg daily with good benefit, but unclear reason for discontinuation back in June 2024,  possibly due to lack of Rx refill. Agreed to restart Depakote at this visit at lower dose of 500 mg daily and can increase at next PCP visit if tolerating well. Of note, confirmed LFTs WNL (05/08/23). At a future visit, could also consider switching pt from Seroquel to Abilify given lower risk of drowsiness and is covered on [facility] Medicaid formulary though likely would require gradual taper/cross-taper. Per chart review, pt found Abilify helpful in the past and tolerated well but could not afford it at that time.  Restart Depakote ER 500 mg once daily  Continue Seroquel 200 mg nightly  Future Considerations:  Increase Depakote to 1000 mg once daily in ~1 week if tolerating and to achieve clinical effect. Recommend to repeat LFTs if dose is further increased.    Consider switching Seroquel to Abilify with a cross-titration:   Seroquel  Abilify    Week 1  100 mg 5 mg    Week 2  50 mg 10 mg   Week 3  Off  15 mg    Previous medication trials: risperidone (TD, bruxism, blunting), haloperidol (TD), Abilify (helpful, but expensive), Latuda (ineffective), amitriptyline, Li+, sertraline, diazepam     #Acute on Chronic Pain: Uncontrolled per pt report and needing higher than prescribed doses of chronic oxycodone regimen recently. Pain described as nociceptive rather than neuropathic, so do not feel increasing Lyrica or Cymbalta is warranted. Has previously trialed Suboxone (dc'd due to itchiness) and Subutex (ineffective, unclear if adequate trial), but could be potential candidate for Butrans patch if pt amenable and not a significant concern for allergic reaction w/ past Suboxone trial, confirmed Lavera Guise is covered on  Medicaid formulary. Defer discussion of Butrans patch trial to next PCP visit.   Refill oxycodone 5 mg BID PRN for pain to bridge to upcoming PCP visit.   Continue:  Lyrica 75 mg TID   Cymbalta 60 mg daily   Flexeril 5 mg TID   Tylenol, Motrin, Diclofenac gel PRN   Future Considerations:  Start once-weekly Butrans 5 mcg/hour or 10 mcg/hour patch based on current opiate usage ~ 30 MMEs/day - could also consider low-dose, short-term course of oxycodone PRN for breakthrough pain after initiating Butrans while it takes time to reach steady state levels    # T2DM: controlled based on last A1c 5.7% (01/01/23). Goal <7.0% per ADA guidelines. From pt history and 05/03/23 ED note, unclear what caused the hypoglycemic episode but possibly poor PO intake and/or taking higher than prescribed dose of insulin 30 units in the setting of SI. Unable to assess glucose trends at this visit without CGM data so will not make any med changes and reassess at next Pharmacotherapy visit in ~2 weeks.    Continue Trulicity 1.5 mg subQ once weekly   Continue Lantus 20 mg nightly.   Continue Jardiance 25 mg daily  Future Considerations:  Switch Trulicity to equivalent dose Ozempic (preferred on formulary) and uptitrate if issues with Trulicity access in the future  May be eligible for [facility] in the future after trial and failure of both Ozempic and Trulicity  Taper insulin as indicated and as able    #COPD (FEV1 >80% on PFTs 04/2022): uncontrolled on LABA/LAMA with mMRC score of 4 today and somewhat recent ED visit on 02/15/23 for COPD exacerbation. Will escalate inhaler regimen to triple therapy w/ Trelegy given Ellipta device is the same as previous regimen and pt already familiar with how to use.   Stop [employer]  Start Trelegy Ellipta 100-62.5-25  mcg/actuation 1 puff daily.   Continue albuterol HFA inhaler (w/ spacer) q4-6h PRN  Continue ipratropium-albuterol nebulized solution q4-6h PRN.     # Medication Management:  Aimed to minimize polypharmacy at this visit and made the following medication changes:   Stop magnesium oxide 400 mg daily   Last Mg WNL @ 1.8 (05/08/23)   Stop Chantix 1 mg daily  Continues to smoke ~ 1 ppd despite being on Chantix, which was started in 2023. Desires to quit, but not ready to set a quit date. Can consider restarting Chantix when patient's primary problems have resolved and/or if pt notices worsening of cigarette cravings with being off medication.  Stop colchicine 0.6 mg daily   Was previously started for toe and knee pain, unclear if had history of gout attack and is not on maintenance therapy so daily prophylactic colchicine likely not indicated. Last uric acid level WNL @ 4.8 (03/31/22).  Consolidated lisinopril 20 mg daily and amlodipine 10 mg daily to combination pill Lotrel (benazepril 20 mg + amlodipine 10 mg) daily.   BP controlled based on today's clinic BP 120/81. Goal BP <130/80 per ACC/AHA Guidelines.   Encouraged use of medication pill box and reviewed appropriate use. Reviewed the indication, dose, and frequency of each medication with patient.     Recommendations and medication-related problems were discussed directly with the patient's transitions of care physician, Dr. Laymond Purser, immediately following the pharmacist visit prior to the physician visit. Questions/concerns were addressed to the patient's satisfaction. I spent a total of 30 minutes face to face with the patient delivering clinical care and providing education/counseling.      Future Appointments   Date Time Provider Department Center   05/20/2023 11:00 AM Kirby Crigler, MD [facility]   05/29/2023  8:30 AM Melba Coon, CPP [facility]   06/02/2023  9:30 AM Sylvan Cheese, AGNP [facility]   06/02/2023 11:40 AM [facility]   06/02/2023  1:30 PM Thi Evern Bio, FNP [facility]   07/01/2023  8:00 AM HBR MRI RM 2 [facility] [place] [facility]   07/15/2023  1:00 PM Titus Dubin, FNP [id number]   07/23/2023  1:00 PM Zelasky, Trixie Deis, PA [facility]     ___________________________________________________    Karie Georges, PharmD  PGY2 Pharmacotherapy Pharmacy Resident    [name], PharmD, CPP, [facility]  Family Medicine Clinical Pharmacist

## 2023-05-13 NOTE — Unmapped (Signed)
Family Medicine  Care Management Transitions of Care Note    Presenting Problem:  Tonya Wood has been identified as a Transitions patient who is at risk for readmission.    Tonya Wood presents to [facility] several days after multiple ED and urgent care visits. She presented to the ED for pain management and was hospitalized for SI.  She reports feeling the same since discharge. She reported the pain has felt worse the past month.    Assessment:  Social History     Socioeconomic History    Marital status: Married     Spouse name: None    Number of children: None    Years of education: None    Highest education level: None   Tobacco Use    Smoking status: Every Day     Current packs/day: 2.00     Average packs/day: 2.0 packs/day for 37.6 years (75.1 ttl pk-yrs)     Types: Cigarettes     Start date: 10/17/1985    Smokeless tobacco: Never   Vaping Use    Vaping status: Never Used   Substance and Sexual Activity    Alcohol use: No    Drug use: Never    Sexual activity: Not Currently   Social History Narrative    PSYCHIATRIC HX:     -Current provider(s):  No psychiatric but resources for [employer] given     -Suicide attempts/SIB: Attempts: YES, per chart review 1998 jumped off bridge    -Psych Hospitalizations:  YES, multiple    -Med compliance hx: has not been on psych meds in a long while per patient    -Fa hx suicide: not asked        SUBSTANCE ABUSE HX:     -Current using substance: NO    -Hx w/d sxs: not asked    -Sz Hx: not asked    -DT Hx:not asked        SOCIAL HX:    -Current living environment: Lives with family    -Current support(s): children and parent/s    -Violence (perp): NO    -Access to Firearms: NO                     Home Health Services: N/A    DME:   DME Agency: [facility]   Equipment: cane and walker   New order: no    Personal Care Service/Personal Aide:    PCS Agency: [facility]   New order: yes      Behavioral Health:  PHQ9: was completed with a score of 24  GAD7: was not completed  Behavioral Health Provider: None  Current Mental Health Status:  Patient declined to answer the last question on the PHQ9 related to SI. Patient was given information for [facility] to establish but she declined to make an appointment. SW as well as provider attempted to gauge her willingness to establish with a mental health provider but patient declined.    Advanced Directives: does not have on file. Health Care Decision Maker was not completed today.    Intervention:  Introduced self and role at [facility].  Reviewed events leading to hospitalization.  SW used problem solving skills to determine barriers to care.  SW reviewed supports noting main source of support.  Gathered current concerns related to current health conditions, medications, and mental health.  SW debriefed information with pharmacist and provider.  Reviewed same day or after hours care options.  I  provided an intervention for the Safety & Environment  SDOH domain. The intervention was referral to [facility] services.     Additional information/Plan:  Tonya Wood was provided with this writer's direct office phone number should additional needs arise.    Mahlon Gammon, LCSW  [facility]    Upcoming appointments:  Future Appointments   Date Time Provider Department Center   05/20/2023 11:00 AM Kirby Crigler, MD [facility]   05/29/2023  8:30 AM Melba Coon, CPP [facility]   06/02/2023  9:30 AM Sylvan Cheese, AGNP [facility]   06/02/2023 11:40 AM [facility]   06/02/2023  1:30 PM Thi Evern Bio, FNP [facility]   07/01/2023  8:00 AM HBR MRI RM 2 [facility] [place] [facility]   07/15/2023  1:00 PM Titus Dubin, FNP [id number]   07/23/2023  1:00 PM Zelasky, Trixie Deis, PA [facility]

## 2023-05-14 NOTE — Unmapped (Signed)
Regarding: Child psychotherapist question post apt yesterday  ----- Message from [place]ona T sent at 05/14/2023  8:20 AM EDT -----  If you had not called [facility], what do you think you would have done?   Make Appointment / Call Office

## 2023-05-14 NOTE — Unmapped (Signed)
Reason for Disposition  ??? [1] Caller requesting NON-URGENT health information AND [2] PCP's office is the best resource    Answer Assessment - Initial Assessment Questions  1. REASON FOR CALL or QUESTION: What is your reason for calling today? or How can I best help you? or What question do you have that I can help answer?      Pt trying to reach social worker she spoke with at [facility] yesterday 8/27. Declined triage. Transferred pt to now open office for further assistance    Protocols used: Information Only Call - No Triage-A-AH

## 2023-05-15 NOTE — Unmapped (Signed)
[facility]  Care Management Progress Note               Purpose of contact: Personal Care Services (PCS)     SW called [facility] (spoke with multiple departments, Laurelyn Sickle. resolved issue): PCS form should be faxed to [phone].     Shea Stakes, CCM  [facility] Healthcare Manager  [facility]  [phone]

## 2023-05-15 NOTE — Unmapped (Signed)
error 

## 2023-05-18 NOTE — Unmapped (Signed)
MMRC assessment concerning for incomplete COPD control.  Will augment current inhaler regimen, start Trelegy

## 2023-05-18 NOTE — Unmapped (Signed)
Pt with uncontrolled mood and worsening psychosis, team has been unable to get patient established with psychiatry.  Recent ED visits, pt endorses passive SI in setting of uncontrolled pain, CM following.  Prior regimen of depakote and seroquel reportedly worked well for patient, will start depakote today.  Uptitrate and repeat LFTs as indicated and tolerated

## 2023-05-18 NOTE — Unmapped (Signed)
Transitions of Care Note    Subjective      Admission Date: 05/08/23  Discharge Date: 05/08/23  Discharge Hospital/Unit: [facility] DEPT [facility]  The patient [facility] and sent to her home.      Chief Complaint: Patient is here in follow-up to their recent hospitalization and to discuss the following medical problems: chronic pain, passive SI, MS    HISTORY OF PRESENT ILLNESS:    Tonya Wood is a 57 y.o. female who presents for transitions hospital follow up.    Hospitalized for: chronic pain, SI    Interval update: continued pain, SI without active plan    Pt reported factors contributing to admission or ED visit:  poorly controlled chronic pain    Patient uses pill box yes    PHQ-9 PHQ-9 TOTAL SCORE   05/13/2023   1:45 PM 24   02/20/2023   2:00 PM 26   01/01/2023   3:13 PM 23   12/28/2021   9:00 AM 7       The remaining 10 systems reviewed were negative.      Patient has been seen by the pharmacist today and I have reviewed their note and recommendations. See pharmacist note for details.  Patient has been seen by the social worker today and I have reviewed their note and recommendations.  See social work note for details.    I have reviewed the patients discharge summary for this hospitalization.  I have also reviewed the problem list, allergies, family and social history and updated them as needed.         Objective     Tonya Wood  height is 170.2 cm (5' 7) and weight is 88.1 kg (194 lb 3.2 oz). Her temporal temperature is 35.9 ??C (96.6 ??F). Her blood pressure is 120/81 and her pulse is 96.     GEN: well appearing, NAD    HEENT: NCAT, No scleral icterus. Conjunctiva non-erythematous. MMM.   CV: Regular rate and rhythm. No murmurs/rubs/gallops.   Pulm: Normal work of breathing on RA. CTAB. No wheezing, crackles, or rhonchi.   Abd: Flat.  Nontender. No guarding, rebound.  Normoactive bowel sounds.     Neuro: A&O x 3. No focal deficits.   Ext: No peripheral edema.  Palpable distal pulses.   Skin: No obvious rashes or skin lesions.    Labs:  I have reviewed the labs from this hospitalization and the ones on the day of discharge and have followed up on any pending labs at the time of discharge.  See Epic Labs section for details.         Assessment/Plan:     Problem List Items Addressed This Visit          Nervous and Auditory    Multiple sclerosis (CMS-HCC) - Primary       Other    Chronic pain syndrome    Relevant Medications    oxyCODONE (ROXICODONE) 5 MG immediate release tablet        Medications prescribed or ordered upon discharge were reviewed on 8/27 and reconciled with the most recent outpatient medication list. I have reviewed and agree with this medication reconciliation.      The following medications changes were made:   - Start Trelegy, stop anoro  - Start depakote  - Transition to [employer] combo pill, stop individual meds     Biggest Risk for Readmission: continued chronic pain, SMI    Items to follow-up  on next visit:   - Uptitrate depakote as indicated  - Discuss chronic pain      Necessary referral have been made.  See Visit Summary for details of referrals.    I will forward my plan and recommendations to patients PCP, Kirby Crigler, MD    Follow-up with PCP or another provider has been scheduled:   Future Appointments   Date Time Provider Department Center   05/20/2023 11:00 AM Kirby Crigler, MD [facility]   05/29/2023  8:30 AM Melba Coon, CPP [facility]   06/02/2023  9:30 AM Mellody Life, Nilda Simmer, AGNP [facility]   06/02/2023 11:40 AM [facility]   06/02/2023  1:30 PM Thi Evern Bio, FNP [facility]   07/01/2023  8:00 AM HBR MRI RM 2 [facility] [place] [facility]   07/15/2023  1:00 PM Titus Dubin, FNP [id number]   07/23/2023  1:00 PM Zelasky, Trixie Deis, PA [facility]        Total time spent face to face with the patient was 50 minutes of  which 25 minutes were spent counseling/coordinating care regarding her: recent hospitalization and the following conditions: chronic pain, passive SI, MS         [facility]  [place] [facility]  [address] [place], [place] [address]  Telephone [phone]  Fax [phone]  [email or web address]

## 2023-05-18 NOTE — Unmapped (Signed)
Controlled today, transitioned to combo CCB-ACEi to reduce pill burden    BP Readings from Last 3 Encounters:   05/13/23 120/81   05/10/23 95/67   05/08/23 118/83

## 2023-05-18 NOTE — Unmapped (Signed)
Pt with significant chronic pain, primary reason for her visit today.  Had previously been well-controlled on oxycodone 5mg  BID, has been increasing the frequency that she's been taking it in setting of acute worsening of pain.  Short oxycodone course prescribed today to bridge patient to PCP, discussed that any substantive changes in her pain regimen should be discussed with her PCP.  Could consider transitioning patient to Butrans vs. Refer to pain, see pharmacy note for recommendations

## 2023-05-20 ENCOUNTER — Ambulatory Visit: Admit: 2023-05-20 | Discharge: 2023-05-21 | Payer: PRIVATE HEALTH INSURANCE

## 2023-05-20 LAB — TOXICOLOGY SCREEN, URINE
AMPHETAMINE SCREEN URINE: NEGATIVE
BARBITURATE SCREEN URINE: NEGATIVE
BENZODIAZEPINE SCREEN, URINE: NEGATIVE
BUPRENORPHINE, URINE SCREEN: NEGATIVE
CANNABINOID SCREEN URINE: NEGATIVE
COCAINE(METAB.)SCREEN, URINE: NEGATIVE
FENTANYL SCREEN, URINE: NEGATIVE
METHADONE SCREEN, URINE: NEGATIVE
OPIATE SCREEN URINE: NEGATIVE
OXYCODONE SCREEN URINE: POSITIVE — AB

## 2023-05-20 MED ORDER — OXYCODONE 5 MG TABLET
ORAL_TABLET | Freq: Two times a day (BID) | ORAL | 0 refills | 30 days | Status: CP | PRN
Start: 2023-05-20 — End: 2023-06-19
  Filled 2023-05-21: qty 60, 30d supply, fill #0

## 2023-05-20 MED ORDER — POLYETHYLENE GLYCOL 3350 17 GRAM/DOSE ORAL POWDER
Freq: Every day | ORAL | 0 refills | 30 days | Status: CP
Start: 2023-05-20 — End: ?
  Filled 2023-05-23: qty 510, 30d supply, fill #0

## 2023-05-20 NOTE — Unmapped (Signed)
Patient called earlier and is requesting a refill on the oxyCODONE (ROXICODONE). Pharmacy is requesting that Dr. Loleta Chance send a prior authorization for the medicine to be refilled.

## 2023-05-20 NOTE — Unmapped (Signed)
Orders:    oxyCODONE (ROXICODONE) 5 MG immediate release tablet; Take 1 tablet (5 mg total) by mouth two (2) times a day as needed for pain (severe pain unresponsive to Tylenol/Ibuprofen).

## 2023-05-20 NOTE — Unmapped (Addendum)
Has been taking oxycodone 3-4 times daily for teeth pain, back pain. Also has constipation, discussed using Miralax today. Will send for utox today. Opioid agreement signed today. Will send for refills of oxycodone 5 mg BID PRN, though pt notably has been using many more per day than she is ordered to take (QID) in setting of recent dental issue, working on getting all teeth extracted. Discussed with patient that she is taking more than is prescribed, so I will refill today but will not be able to give another refill until the beginning of October (10/2).  Orders:    polyethylene glycol (GLYCOLAX) 17 gram/dose powder; Take 17 g by mouth daily.    oxyCODONE (ROXICODONE) 5 MG immediate release tablet; Take 1 tablet (5 mg total) by mouth two (2) times a day as needed for pain (severe pain unresponsive to Tylenol/Ibuprofen).

## 2023-05-20 NOTE — Unmapped (Signed)
Medication Request     Patient Name: Tonya Wood   Caller: Self (Patient)  Name of Caller:  Have you contacted your pharmacy? yes      Last Visit: 05/20/2023       Medication Name: oxyCODONE (ROXICODONE)  Dosage: 5 MG immediate release tablet   Route: Oral (PO)  Frequency: Twice a day (BID)  Day Supply Requested: 30  Pharmacy (Name & Address):  5 MG immediate release tablet    Pharmacy Phone Number: [phone]     Patient states that the pharmacy has not received the prescription yet and is requesting that it be sent to [facility].

## 2023-05-20 NOTE — Unmapped (Signed)
I have reviewed documentation by the resident. I was readily available for consultation upon request. Lurlean Leyden, MD

## 2023-05-20 NOTE — Unmapped (Signed)
[facility]  Established Patient Clinic Note    Assessment/Plan:   Tonya Wood is a 57 y.o.female who  has a past medical history of AKI (acute kidney injury) (CMS-HCC) (04/22/2019), Bronchitis, COPD (chronic obstructive pulmonary disease) (CMS-HCC), Depression, Diabetes (CMS-HCC) (05/19/2019), Diabetes mellitus (CMS-HCC), Emphysema lung (CMS-HCC), Hyperglycemic hyperosmolar nonketotic coma (CMS-HCC) (04/22/2019), Hypertension, Hyponatremia (04/22/2019), MDD (major depressive disorder), severe (CMS-HCC) (05/19/2019), Moderate episode of recurrent major depressive disorder (CMS-HCC) (12/28/2021), Odontogenic infection of jaw (12/25/2018), Severe cocaine use disorder (CMS-HCC) (05/07/2019), Smoker (12/27/2019), and Unsteady gait (06/02/2015). coming in for the following issues:    Assessment & Plan  Chronic, continuous use of opioids  Has been taking oxycodone 3-4 times daily for teeth pain, back pain. Also has constipation, discussed using Miralax today. Will send for utox today. Opioid agreement signed today. Will send for refills of oxycodone 5 mg BID PRN, though pt notably has been using many more per day than she is ordered to take (QID) in setting of recent dental issue, working on getting all teeth extracted. Discussed with patient that she is taking more than is prescribed, so I will refill today but will not be able to give another refill until the beginning of October (10/2).  Orders:    polyethylene glycol (GLYCOLAX) 17 gram/dose powder; Take 17 g by mouth daily.    oxyCODONE (ROXICODONE) 5 MG immediate release tablet; Take 1 tablet (5 mg total) by mouth two (2) times a day as needed for pain (severe pain unresponsive to Tylenol/Ibuprofen).    Weight loss of more than 10% body weight  Pt has been unable to eat anything because of tooth pain. Waiting for oral surgery to remove all of her teeth. Needs help getting ensure supplements; message sent to case managers to help get that for her.        Medication management    Orders:    Toxicology Screen, Urine; Future    Toxicology Screen, Urine    Chronic pain syndrome    Orders:    oxyCODONE (ROXICODONE) 5 MG immediate release tablet; Take 1 tablet (5 mg total) by mouth two (2) times a day as needed for pain (severe pain unresponsive to Tylenol/Ibuprofen).          Luther Hearing MD MPH  Family Medicine PGY3    Attending: Dr. Marisa Sprinkles    Subjective   Tonya Wood is a 57 y.o. female coming to clinic today for the following issues:    Chief Complaint   Patient presents with    Follow-up     Utox/medication       HPI:     # Teeth pain  Is going to have all teeth pulled  Having oral surgery  Has terrible teeth pain, all of teeth hurt  Last dose of oxycodone was today at 2am  Smokes cigarettes every now and then    Breathing has been okay  Pain is the worst issue  Poops every other day    Social worker supposed to contact her? In home care never contacted her? She never let me know anything  Can't find ensure or depends, so her Mom bought her a think of ensure and depends    Living with her mom currently    Has been feeling much better mood wise, no longer suicidal  Back on depakote      I have reviewed the problem list, medications, and allergies and have updated/reconciled them if needed.    Tonya Wood  reports that she has been smoking cigarettes. She started smoking about 37 years ago. She has a 75.2 pack-year smoking history. She has never used smokeless tobacco.  Health Maintenance   Topic Date Due    Zoster Vaccines (1 of 2) Never done    Retinal Eye Exam  04/24/2021    Influenza Vaccine (1) 05/18/2023    Colon Cancer Screening  05/22/2023    Hemoglobin A1c  07/03/2023    Lung Cancer Screening Shared Decision Making  12/03/2023    Foot Exam  01/01/2024    Urine Albumin/Creatinine Ratio  01/01/2024    Serum Creatinine Monitoring  05/07/2024    Potassium Monitoring  05/07/2024    Mammogram Start Age 19  11/19/2024    HPV Cotest with Pap Smear (21-65)  01/16/2026    Pap Smear with Cotest HPV (21-65)  01/16/2026    COPD Spirometry  05/09/2027    Lipid Screening  09/26/2027    DTaP/Tdap/Td Vaccines (3 - Td or Tdap) 05/04/2029    Pneumococcal Vaccine 0-64  Completed    Hepatitis C Screen  Completed    COVID-19 Vaccine  Completed       Objective     VITALS: BP 100/65  - Pulse 81  - Temp 36.6 ??C (97.9 ??F) (Temporal)  - Ht 170.2 cm (5' 7)  - Wt 88 kg (194 lb)  - BMI 30.38 kg/m??     Physical Exam  GEN: well appearing, appears stated age, NAD   HEENT: NCAT, No scleral icterus. MMM. EOMI.  CV: Clinically well perfused. RRR, no MRG.  Pulm: Normal work of breathing ORA. Lungs CTAB. No wheezes, rales, ronchi.  Abd: Flat, soft, nontender, nondistended.  Neuro: A&O x 3. No focal deficits.  Ext: Warm. Moves all extremities spontaneously.  Skin: No concerning rashes or skin lesions grossly observable on exposed skin.  Psych: Normal affect.      LABS/IMAGING  I have reviewed pertinent recent labs and imaging in Epic    [facility]  [place] [facility]  [address], [place], [place] [address]  Telephone [phone]  Fax [phone]  [email or web address]

## 2023-05-20 NOTE — Unmapped (Addendum)
Pt has been unable to eat anything because of tooth pain. Waiting for oral surgery to remove all of her teeth. Needs help getting ensure supplements; message sent to case managers to help get that for her.

## 2023-05-21 NOTE — Unmapped (Signed)
[facility]  Care Management Progress Note               Purpose of contact: PCS, ACT team, Boost    PCS: SW updated patient that PCS referral has been faxed to [facility]. They will call her directly in the next 1-2 weeks to schedule an assessment.     Boost: SW messaged CPP team and [facility] RD to find if any programs will cover the cost of boost/ensure. SW let patient know about $120 [employer] benefit for OTC medications; patient has to call the number on the back of her [facility] card. Patient confirmed she has her card.     ACT team: SW talked with patient about ACT teams, she is interested in working with one. SW left a voicemail on the intake line for [place]'s ACT team (phone: [phone]).    Housing: Patient has unstable housing -she is thinking about moving in with her son. SW talked with her about the [facility] - gave her the number and transferred her to their office. SW encouraged her to leave her name and phone number if they don't answer.     Patient has SW's contact information and SW encouraged patient to call SW with any questions.    Provider/Care Partner(s) to follow up on: N/A    Shea Stakes, CCM  [facility]  [facility]  [phone]

## 2023-05-22 NOTE — Unmapped (Signed)
Pt updated on progress of medication refill.

## 2023-05-22 NOTE — Unmapped (Signed)
[facility] Refill Coordination Note    Specialty Lite Medication(s) to be Shipped:   Trulicity    Other medication(s) to be shipped: No additional medications requested for fill at this time     Tonya Wood, DOB: 03/10/1966  Phone: [phone] (home)       All above HIPAA information was verified with patient.     Was a Nurse, learning disability used for this call? No    Changes to medications: [name] reports no changes at this time.  Changes to insurance: No      REFERRAL TO PHARMACIST     Referral to the pharmacist: Not needed      [facility]     Shipping address confirmed in Epic.     Delivery Scheduled: Yes, Expected medication delivery date: 05/23/23.     Medication will be delivered via Same Day Courier to the prescription address in Epic WAM.    [name]   [facility] Specialty Technician

## 2023-05-23 MED FILL — TRULICITY 1.5 MG/0.5 ML SUBCUTANEOUS PEN INJECTOR: SUBCUTANEOUS | 28 days supply | Qty: 2 | Fill #2

## 2023-05-23 NOTE — Unmapped (Signed)
[facility]  Care Management Progress Note                 Purpose of contact: ACT team    SW left a voicemail on the intake line for [place]'s ACT team (phone: [phone]).     Provider/Care Partner(s) to follow up on: N/A     Shea Stakes, CCM  [facility]  [facility]  [phone]

## 2023-05-26 NOTE — Unmapped (Addendum)
[facility]           Purpose of contact: DME - incontinence supplies and oral nutrition     Incontinence Supplies: Patient requesting incontinence briefs and bed pads. SW verified address in chart is correct ([address] Messaged PCP requesting she place order.    Boost/Ensure: Patient had an appointment with [facility] this morning to discuss; appointment was scheduled in error as patent's PCP is at [facility].     SW message oncology RD to find out unde what circumstances medicaid covers oral nutrition for onc patients to see if patient may qualify.    Patient asked SW to call [phone], which is [employer], about boost/ensure as this is where patient's grandmother received hers. SW called [employer]- [place] medicaid only covers Boost/ensure if it is through the CAP program.   SW messaged [employer] requesting clarification on policy. SW received policy - oral nutrition is only covered for ages 57-15 years old; metabolic formulas for in-born errors of metabolism diagnosed at birth diagnosed before the age of 30 are all that is covered for patients over 57 years old.  SW called [facility] (phone: [phone], fax: [phone]) and they said medicaid does cover boost/ensure. SW requested PCP put in order (information conflicts with policy sent but SW will continue to explore this option).     Patient said she is barely able to get out of the bed, she is depressed and cannot eat due to her tooth pain. Her oral surgery has not been scheduled yet. SW messaged DA Melissa Lassiter to request an update on the status of scheduling patient's surgery.      Patient has SW's contact information and SW encouraged patient to call SW with any questions.     Provider/Care Partner(s) to follow up on: Please put in orders for incontinence supplies and oral nutrition. Let me know once in and I'll fax the orders. Thank you!     Plan:  [x]  [facility]): SW faxed PCS form to [facility] on 8/29. SW updated patient PCS will contact her directly to schedule an assessment.   []  Oral nutrition: Patient requesting boost or ensure. While Medicaid policy states she doesn't qualify, [facility] (phone: [phone], fax: [phone]) says patient does. SW requested order from PCP to send to [facility] on 05/26/23  []  Incontinence supplies: SW requested order from PCP on 05/26/23   [x]  ACT team: SW left multiple messages with [facility] ACT team to refer patient. Most recent voicemail left on 9/6          Knute Neu, LCSW, CCM  [facility] Health Care Manager  [facility] Family Medicine  [phone]

## 2023-05-26 NOTE — Unmapped (Signed)
Nurse received fax PA for Trelegy. PA sent to [employer] awaiting results. KEY: [id number]

## 2023-05-27 ENCOUNTER — Emergency Department
Admit: 2023-05-27 | Discharge: 2023-05-27 | Disposition: A | Discharge status: Home / Self Care | Payer: PRIVATE HEALTH INSURANCE

## 2023-05-27 ENCOUNTER — Ambulatory Visit
Admit: 2023-05-27 | Discharge: 2023-05-27 | Disposition: A | Discharge status: Home / Self Care | Payer: PRIVATE HEALTH INSURANCE

## 2023-05-27 DIAGNOSIS — R638 Other symptoms and signs concerning food and fluid intake: Principal | ICD-10-CM

## 2023-05-27 DIAGNOSIS — N393 Stress incontinence (female) (male): Principal | ICD-10-CM

## 2023-05-27 DIAGNOSIS — J449 Chronic obstructive pulmonary disease, unspecified: Principal | ICD-10-CM

## 2023-05-27 MED ORDER — DOXYCYCLINE HYCLATE 100 MG CAPSULE
ORAL_CAPSULE | Freq: Two times a day (BID) | ORAL | 0 refills | 10 days | Status: CP
Start: 2023-05-27 — End: 2023-06-06
  Filled 2023-05-27: qty 20, 10d supply, fill #0

## 2023-05-27 MED ORDER — BENZONATATE 200 MG CAPSULE
ORAL_CAPSULE | Freq: Three times a day (TID) | ORAL | 0 refills | 5 days | Status: CP | PRN
Start: 2023-05-27 — End: 2023-06-01
  Filled 2023-05-27: qty 15, 5d supply, fill #0

## 2023-05-27 MED ORDER — FOOD SUPPLEMENT, LACTOSE-REDUCED ORAL LIQUID
3 refills | 0 days | Status: CP
Start: 2023-05-27 — End: ?

## 2023-05-27 MED ORDER — MISCELLANEOUS MEDICAL SUPPLY MISC
3 refills | 0 days | Status: CP
Start: 2023-05-27 — End: ?

## 2023-05-27 MED ORDER — DIAPER,BRIEF,ADULT,DISPOSABLE
1 refills | 0 days | Status: CP | PRN
Start: 2023-05-27 — End: ?

## 2023-05-27 NOTE — Unmapped (Signed)
[facility]  Emergency Department Provider Note      ED Clinical Impression      Final diagnoses:   Chronic obstructive pulmonary disease, unspecified COPD type (CMS-HCC) (Primary)            Impression, Medical Decision Making, Progress Notes and Critical Care      Differential diagnosis: COPD patient, viral infection, pneumonia.    Plan:  Will obtain basic labs, rapid respiratory pathogen panel, and CXR.    Impression/Problem:    Cough: Patient reported green sputum, increased cough, fevers at home as high as 100.2.  No fever here appears comfortable but still coughing.  Initial workup was will include x-rays, blood work and viral PCR.  Patient states that she has to leave and cannot stay for the rest of the workup.  Given that she has a change in sputum, increased cough and reported fevers at home I think is reasonable to start the patient empirically on antibiotics.  Patient has bronchodilators at home.  Will DC with antibiotics and with aspiration to come back if her symptoms worsen.      Independent Interpretation of Studies    I have independently interpreted the following studies:  X-ray(s): None    Discussion of Management with other Providers or Support Staff    I discussed the management of this patient with the:  Admitting provider: None  Consultant(s): None  Radiologist: None  ED Pharmacist: None  Case Management/Social Work: None    Considerations Regarding Disposition/Escalation of Care and Critical Care    Indications for observation/admission (or consideration of observation/admission) and/or appropriateness for outpatient management: None    Patient/Family/Caregiver Discussions: None    Social Determinants of Health which significantly affected care:     None    Critical Care:    None    Portions of this record have been created using Scientist, clinical (histocompatibility and immunogenetics). Dictation errors have been sought, but may not have been identified and corrected.    See chart and resident provider documentation for details.    ____________________________________________         History        Reason for Visit  Shortness of Breath      HPI   Tonya Wood is a 57 y.o. female with a past medical history of HTN, T2DM, hypothyroidism, COPD, MS, depression, schizoaffective disorder, and prior substance abuse presenting to the Emergency Department with shortness of breath. The patient reports 1 week of a productive cough with associated shortness of breath with exertion. She states she brings up green-colored sputum with her cough. She also states she had fevers (Tmax 101F) a few days ago which have resolved. She reports she has used home nebulizer treatments with some relief. She notes she has had some diarrhea recently, although, takes Miralax for constipation at baseline. She endorses smoking cigarettes. She denies sick contacts. She denies sore throat or abdominal pain.      Outside Historian(s)  None    External Records Reviewed  Inpatient/outpatient notes- 05/20/23 Family Medicine Office Visit Note- Reviewed past medical history    Past Medical History:   Diagnosis Date    AKI (acute kidney injury) (CMS-HCC) 04/22/2019    Bronchitis     COPD (chronic obstructive pulmonary disease) (CMS-HCC)     Depression     Diabetes (CMS-HCC) 05/19/2019    Diabetes mellitus (CMS-HCC)     Emphysema lung (CMS-HCC)     Hyperglycemic hyperosmolar nonketotic coma (CMS-HCC) 04/22/2019  Hypertension     Hyponatremia 04/22/2019    MDD (major depressive disorder), severe (CMS-HCC) 05/19/2019    Moderate episode of recurrent major depressive disorder (CMS-HCC) 12/28/2021    Odontogenic infection of jaw 12/25/2018    Severe cocaine use disorder (CMS-HCC) 05/07/2019    Smoker 12/27/2019    Unsteady gait 06/02/2015       Patient Active Problem List   Diagnosis    Adjustment disorder with mixed anxiety and depressed mood    Amaurosis fugax of right eye    Cocaine abuse with cocaine-induced mood disorder (CMS-HCC)    Colonic polyp COPD (chronic obstructive pulmonary disease) (CMS-HCC)    Essential hypertension    Hallux rigidus of right foot    Hyperlipidemia    Hypothyroidism    Impaired cognition    MDD (major depressive disorder), recurrent episode, severe (CMS-HCC)    Obesity    Onychomycosis    Post traumatic stress disorder (PTSD)    Right sided weakness    Schizoaffective disorder, bipolar type (CMS-HCC)    Severe tobacco use disorder    Type 2 diabetes mellitus (CMS-HCC)    Healthcare maintenance    Knee pain    Allergic rhinitis    Ganglion cyst of dorsum of left wrist    Multiple sclerosis (CMS-HCC)    Chronic pain syndrome    Chronic right shoulder pain    Upper back pain, chronic    Toe injury, right, sequela    Weight loss of more than 10% body weight    Chronic, continuous use of opioids       Past Surgical History:   Procedure Laterality Date    CESAREAN SECTION      FINGER SURGERY Right     ring finger reattached    FOOT SURGERY Right        No current facility-administered medications for this encounter.    Current Outpatient Medications:     acetaminophen (TYLENOL EXTRA STRENGTH) 500 MG tablet, Take 2 tablets (1,000 mg total) by mouth Three (3) times a day., Disp: 200 tablet, Rfl: 11    albuterol 2.5 mg /3 mL (0.083 %) nebulizer solution, Inhale 1 vial (2.5 mg total) by nebulization every four (4) hours., Disp: 540 mL, Rfl: 0    amantadine HCL (SYMMETREL) 100 mg capsule, Take 1 capsule (100 mg total) by mouth two (2) times a day., Disp: 60 capsule, Rfl: 5    amlodipine-benazepril (LOTREL) 10-20 mg per capsule, Take 1 capsule by mouth daily., Disp: 30 capsule, Rfl: 5    aspirin (ECOTRIN) 81 MG tablet, Take 1 tablet (81 mg total) by mouth daily., Disp: 90 tablet, Rfl: 3    blood sugar diagnostic (GLUCOSE BLOOD) Strp, Test blood glucose 3 times daily., Disp: 50 strip, Rfl: 11    blood-glucose meter,continuous (FREESTYLE LIBRE 3 READER) Misc, Use as directed to test blood sugar, Disp: 1 each, Rfl: 0    blood-glucose sensor (FREESTYLE LIBRE 3 SENSOR) Devi, Use to monitor blood glucose levels continuously. Change sensor every 14 days., Disp: 6 each, Rfl: 3    cetirizine (ZYRTEC) 10 MG tablet, Take 1 tablet (10 mg total) by mouth daily., Disp: 90 tablet, Rfl: 3    chlorhexidine (PERIDEX) 0.12 % solution, Swish and spit out 15 mls two (2) times a day., Disp: 2365 mL, Rfl: 3    cholecalciferol, vitamin D3-1,250 mcg, 50,000 unit,, 1,250 mcg (50,000 unit) capsule, Take 1 capsule (1,250 mcg total) by mouth once a week., Disp: 24 capsule, Rfl:  0    colchicine (COLCRYS) 0.6 mg tablet, Take 1 tablet (0.6 mg total) by mouth daily., Disp: 90 tablet, Rfl: 3    cyclobenzaprine (FLEXERIL) 5 MG tablet, Take 1 tablet (5 mg total) by mouth Three (3) times a day., Disp: 90 tablet, Rfl: 0    diclofenac sodium (VOLTAREN) 1 % gel, Apply 2 g topically four (4) times a day., Disp: 200 g, Rfl: 6    dimethyl fumarate 240 mg CpDR, Take 1 capsule (240 mg total) by mouth two (2) times a day., Disp: 180 capsule, Rfl: 1    divalproex ER (DEPAKOTE ER) 500 MG extended released 24 hr tablet, Take 1 tablet (500 mg total) by mouth daily., Disp: 30 tablet, Rfl: 1    doxycycline (VIBRAMYCIN) 100 MG capsule, Take 1 capsule (100 mg total) by mouth two (2) times a day for 10 days., Disp: 20 capsule, Rfl: 0    dulaglutide (TRULICITY) 1.5 mg/0.5 mL PnIj, Inject the contents of 1 pen (1.5 mg total) under the skin every seven (7) days., Disp: 6 mL, Rfl: 3    DULoxetine (CYMBALTA) 60 MG capsule, Take 1 capsule (60 mg total) by mouth daily., Disp: 90 capsule, Rfl: 3    empagliflozin (JARDIANCE) 25 mg tablet, Take 1 tablet (25 mg total) by mouth daily., Disp: 90 tablet, Rfl: 3    ezetimibe (ZETIA) 10 mg tablet, Take 1 tablet (10 mg total) by mouth daily., Disp: 90 tablet, Rfl: 3    fluticasone propionate (FLONASE) 50 mcg/actuation nasal spray, Use 1 spray into each nostril daily., Disp: 16 g, Rfl: 11    fluticasone-umeclidin-vilanter (TRELEGY ELLIPTA) 100-62.5-25 mcg inhaler, Inhale 1 puff daily., Disp: 60 each, Rfl: 11    ibuprofen (MOTRIN) 600 MG tablet, Take 1 tablet (600 mg total) by mouth every six (6) hours as needed for pain., Disp: 30 tablet, Rfl: 0    inhaler, assist devices (OPTICHAMBER, AEROCHAMBER, ADULT) Spcr, Use as directed with inhalers, Disp: 1 each, Rfl: 0    insulin glargine (LANTUS SOLOSTAR U-100 INSULIN) 100 unit/mL (3 mL) injection pen, Inject 0.2 mL (20 Units total) under the skin nightly., Disp: 15 mL, Rfl: 0    ipratropium-albuterol (DUO-NEB) 0.5-2.5 mg/3 mL nebulizer, Inhale 3 mL (contents of one nebule) by nebulization every six (6) hours as needed., Disp: 180 mL, Rfl: 3    lancets Misc, Test blood glucose 3 times daily., Disp: 100 each, Rfl: 11    levothyroxine (SYNTHROID) 100 MCG tablet, Take 1 tablet (100 mcg total) by mouth daily., Disp: 90 tablet, Rfl: 0    lidocaine-prilocaine (EMLA) 2.5-2.5 % cream, Apply topically Three (3) times a day as needed., Disp: 30 g, Rfl: 0    magnesium oxide (MAG-OX) 400 mg (241.3 mg elemental magnesium) tablet, Take 1 tablet (400 mg total) by mouth daily., Disp: 30 tablet, Rfl: 11    naloxone (NARCAN) 4 mg nasal spray, Give single spray in one nostril.  Repeat with 2nd device in other nostril every 3 min if no or minimal response until 911 arrives, Disp: 2 each, Rfl: 11    nicotine (NICODERM CQ) 21 mg/24 hr patch, Place 1 patch on the skin daily., Disp: 28 patch, Rfl: 2    nicotine polacrilex (NICORETTE) 4 MG gum, Chew 1 piece of gum (4 mg total) by mouth every hour as directed as needed for smoking cessation., Disp: 110 each, Rfl: 3    ondansetron (ZOFRAN) 4 MG tablet, Take 1 tablet (4 mg total) by mouth daily as needed for  nausea., Disp: 30 tablet, Rfl: 1    oxyCODONE (ROXICODONE) 5 MG immediate release tablet, Take 1 tablet (5 mg total) by mouth two (2) times a day as needed for pain (severe pain unresponsive to Tylenol/Ibuprofen)., Disp: 60 tablet, Rfl: 0    pen needle, diabetic (PEN NEEDLE) 31 gauge x 5/16 (8 mm) Ndle, Injection Frequency is 1 time per day, Disp: 100 each, Rfl: 11    polyethylene glycol (GLYCOLAX) 17 gram/dose powder, Mix 17g (measure to line in cap) as directed and drink mixture daily., Disp: 510 g, Rfl: 0    pregabalin (LYRICA) 75 MG capsule, Take 1 capsule (75 mg total) by mouth Three (3) times a day., Disp: 270 capsule, Rfl: 3    QUEtiapine (SEROQUEL) 200 MG tablet, Take 1 tablet (200 mg total) by mouth nightly., Disp: 90 tablet, Rfl: 3    varenicline (CHANTIX) 1 mg tablet, Take 1 tablet (1 mg total) by mouth daily., Disp: 30 tablet, Rfl: 3    VENTOLIN HFA 90 mcg/actuation inhaler, Inhale 2 puffs by mouth every six (6) hours as needed for wheezing., Disp: 18 g, Rfl: 5    Allergies  Grass pollen-orchardgrass, standard and Statins-hmg-coa reductase inhibitors    Family History   Problem Relation Age of Onset    Asthma Mother     Cancer Son     Cancer Other     Breast cancer Neg Hx        Social History  Social History     Tobacco Use    Smoking status: Every Day     Current packs/day: 2.00     Average packs/day: 2.0 packs/day for 37.6 years (75.2 ttl pk-yrs)     Types: Cigarettes     Start date: 10/17/1985    Smokeless tobacco: Never   Vaping Use    Vaping status: Never Used   Substance Use Topics    Alcohol use: No    Drug use: Never          Physical Exam     PPE worn: Surgical mask, eye protection and gloves     ED Triage Vitals [05/27/23 0736]   Enc Vitals Group      BP 121/74      Heart Rate 97      SpO2 Pulse       Resp 20      Temp 36.9 ??C (98.5 ??F)      Temp src       SpO2 100 %      Weight       Height      Constitutional: Alert and oriented. Well appearing and in no distress.  Eyes: Conjunctivae are normal.  ENT       Head: Normocephalic and atraumatic.       Nose: No congestion.       Mouth/Throat: Mucous membranes are moist.       Neck: No stridor.  Hematological/Lymphatic/Immunilogical: No cervical lymphadenopathy.  Cardiovascular: Normal rate, regular rhythm. Normal and symmetric distal pulses are present in all extremities.  Respiratory: Normal respiratory effort. Breath sounds are normal.  Gastrointestinal: Soft and nontender. There is no CVA tenderness.  Genitourinary: Deferred.  Musculoskeletal: Normal range of motion in all extremities.       Right lower leg: No tenderness or edema.       Left lower leg: No tenderness or edema.  Neurologic: Normal speech and language. No gross focal neurologic deficits are appreciated.  Skin: Skin is warm, dry and  intact. No rash noted.  Psychiatric: Mood and affect are normal. Speech and behavior are normal.     Procedures     None      Documentation assistance was provided by Vilinda Blanks, Scribe on May 27, 2023 at 7:42 AM for Chase Picket, MD.    Documentation assistance was provided by the scribe in my presence.  The documentation recorded by the scribe has been reviewed by me and accurately reflects the services I personally performed.       Chase Picket, MD  05/27/23 0830

## 2023-05-27 NOTE — Unmapped (Signed)
Pt arrives with c/o being sick for one week.  Pt states sob with productive cough with green sputum.  Pt endorsing chills.

## 2023-05-28 NOTE — Unmapped (Signed)
[facility]  Care Management Progress Note               Purpose of contact: Incontinence Supplies     Orders for incontinence supplies and oral nutrition faxed to [facility] (phone: [phone], fax: [phone]).     Patient called SW -patient said she had to leave where she was living in [place], she is now staying with her son and her fiance in [place] (she thinks [place]). She is trying to move her care to [place] and does not plan to come back to [place]. *she had to end call and will call SW back.    SW called patient back later in the afternoon. She said she is not coming back to [facility] tomorrow. She is not sure if she will stay in [place], but hopes to. She will call SW back when she has more of an idea.     SW updated PCP and CPP.     Provider/Care Partner(s) to follow up on: N/A    Plan:  [x]  Personal Care Services [facility]): SW faxed PCS form to [facility] on 8/29. SW updated patient PCS will contact her directly to schedule an assessment.   [x]  Oral nutrition: Patient requesting boost or ensure. While Medicaid policy states she doesn't qualify, [facility] (phone: [phone], fax: [phone]) says patient does. SW sent order on 9/11.  [x]  Incontinence supplies: SW sent order to [facility] on 9/11.  [x]  ACT team: SW left multiple messages with [facility] ACT team to refer patient. Most recent voicemail left on 9/6  [x]  Oral surgery: SW messaged Sheryle Spray, the dental student working with patient. The surgery referral has been sent, it could be another two weeks before patient is contacted to schedule.    Shea Stakes, CCM  [facility] Manager  [facility]  [phone]

## 2023-05-29 ENCOUNTER — Ambulatory Visit: Admit: 2023-05-29 | Payer: PRIVATE HEALTH INSURANCE | Attending: Ambulatory Care | Primary: Ambulatory Care

## 2023-05-29 DIAGNOSIS — H9209 Otalgia, unspecified ear: Principal | ICD-10-CM

## 2023-06-02 ENCOUNTER — Ambulatory Visit: Admit: 2023-06-02 | Discharge: 2023-06-02 | Payer: PRIVATE HEALTH INSURANCE

## 2023-06-02 ENCOUNTER — Institutional Professional Consult (permissible substitution)
Admit: 2023-06-02 | Discharge: 2023-06-02 | Payer: PRIVATE HEALTH INSURANCE | Attending: Audiologist | Primary: Audiologist

## 2023-06-02 DIAGNOSIS — G35 Multiple sclerosis: Principal | ICD-10-CM

## 2023-06-02 DIAGNOSIS — R32 Unspecified urinary incontinence: Principal | ICD-10-CM

## 2023-06-02 DIAGNOSIS — H9209 Otalgia, unspecified ear: Principal | ICD-10-CM

## 2023-06-02 MED ORDER — MISCELLANEOUS MEDICAL SUPPLY MISC
3 refills | 0 days | Status: CP
Start: 2023-06-02 — End: ?

## 2023-06-02 MED ORDER — DIAPER,BRIEF,ADULT,DISPOSABLE
1 refills | 0 days | Status: CP | PRN
Start: 2023-06-02 — End: ?

## 2023-06-02 NOTE — Unmapped (Signed)
AUDIOLOGIC EVALUATION REPORT     Patient: Tonya Wood, Tonya Wood  MRN: [id number]  DOB: 1965/09/18  DATE OF EVALUATION: 06/02/2023    HISTORY     Tonya Wood is a 57 y.o. individual seen by Audiology for a hearing evaluation as part of Marthann Schiller, FNP's ENT clinic. Today, the patient reports intermittent pain of her outer right ear that began approximately 6-8 months ago. She notes that she is not experiencing any pain today. The patient denies, pressure, dizziness, family history of hearing loss, and significant exposure to noise. The patient also denies any concerns related to her hearing.     RESULTS     Otoscopy revealed:  Right Ear: clear external auditory canal  Left Ear: clear external auditory canal     Tympanometry was consistent with normal middle ear function for the right ear and normal middle ear function for the left ear during today's visit.        Vol (cm3) Peak (daPa) Peak (mmho) Tone (Hz)   Right 0.8 -12 1.18 226   Left 0.8 -95 1.53 226       Today's behavioral evaluation was completed using conventional audiometry via insert earphones with good reliability.     Right Ear:  Essentially normal hearing form 125 - 8 kHz with a mild sensorineural hearing loss noted  at 2 kHz  Speech Recognition Threshold (SRT): 25 dB HL  Word Recognition Testing: 96% at 65 dB HL using recorded NU-6 word list.    Left Ear: Essentially normal hearing form 125 - 8 kHz with a mild sensorineural hearing loss noted  at 2 kHz  Speech Recognition Threshold (SRT): 25 dB HL  Word Recognition Testing: 92% at 25 dB HL using recorded NU-6 word list.        Patient was counseled on today's results and expressed understanding.     RECOMMENDATIONS     ENT - Seeing Marthann Schiller, FNP today  Re-evaluate hearing per ENT request, sooner should concerns arise    Access your Audiogram via MyChart:  - Log into myChart: Menu > Document Center > Medical Record Requests  - Click the hyperlink 'Request Medical Records'   - Fill out fields + Select Audiograms option under 'Specific Diagnostic Images' > Submit    Charges associated with this visit:  HC TYMPANOMETRY  HC COMP AUDIO EVAL & SPEECH RECOG

## 2023-06-02 NOTE — Unmapped (Addendum)
- manage constipation (consider miralax)  - timed and double voiding  - bladder training exercises  - try to limit fluids 3 hours prior to bedtime  - discussed caffeine, alcohol, and spicy foods are bladder irritants and contribute to urinary frequency  - plan follow up in 3-6 months. If no improvement, may consider anticholinergic (reviewed side effects including dry mouth, constipation, urinary retention, confusion). He's going to stop the medication and contact us should he develop any of these symptoms. Understands to go to the ED if he cannot urinate over a 6 hour period.          Bladder Training: Care Instructions  Your Care Instructions     Bladder training is used to treat urge incontinence and stress incontinence. Urge incontinence means that the need to urinate comes on so fast that you can't get to a toilet in time. Stress incontinence means that you leak urine because of pressure on your bladder. For example, it may happen when you laugh, cough, or lift something heavy.  Bladder training can increase how long you can wait before you have to urinate. It can also help your bladder hold more urine. And it can give you better control over the urge to urinate.  It is important to remember that bladder training takes a few weeks to a few months to make a difference. You may not see results right away, but don't give up.  Follow-up care is a key part of your treatment and safety. Be sure to make and go to all appointments, and call your doctor if you are having problems. It's also a good idea to know your test results and keep a list of the medicines you take.  How can you care for yourself at home?  Work with your doctor to come up with a bladder training program that is right for you. You may use one or more of the following methods.  Delayed urination  In the beginning, try to keep from urinating for 5 minutes after you first feel the need to go.  While you wait, take deep, slow breaths to relax. Kegel exercises can also help you delay the need to go to the bathroom.  After some practice, when you can easily wait 5 minutes to urinate, try to wait 10 minutes before you urinate.  Slowly increase the waiting period until you are able to control when you have to urinate.  Scheduled urination  Empty your bladder when you first wake up in the morning.  Schedule times throughout the day when you will urinate.  Start by going to the bathroom every hour, even if you don't need to go.  Slowly increase the time between trips to the bathroom.  When you have found a schedule that works well for you, keep doing it.  If you wake up during the night and have to urinate, do it. Apply your schedule to waking hours only.  Kegel exercises  These tighten and strengthen pelvic muscles, which can help you control the flow of urine. (If doing these exercises causes pain, stop doing them and talk with your doctor.) To do Kegel exercises:  Squeeze your muscles as if you were trying not to pass gas. Or squeeze your muscles as if you were stopping the flow of urine. Your belly, legs, and buttocks shouldn't move.  Hold the squeeze for 3 seconds, then relax for 5 to 10 seconds.  Start with 3 seconds, then add 1 second each week until you  are able to squeeze for 10 seconds.  Repeat the exercise 10 times a session. Do 3 to 8 sessions a day.  When should you call for help?  Watch closely for changes in your health, and be sure to contact your doctor if:    Your incontinence is getting worse.     You do not get better as expected.   Where can you learn more?  Go to [facility] at [email or web address] forces [email or web address] in the Menu. Enter [id number] in the search box to learn more about Bladder Training: Care Instructions.  Current as of: July 31, 2022  Content Version: 14.1  ?? 2006-2024 [employer].   Care instructions adapted under license by [facility]. If you have questions about a medical condition or this instruction, always ask your healthcare professional. [employer] disclaims any warranty or liability for your use of this information.

## 2023-06-02 NOTE — Unmapped (Signed)
Assessment:   Diagnosis ICD-10-CM Associated Orders   1. Urinary incontinence, unspecified type  R32 Measure post void residual      2. 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 We discussed that her MS could be contributing somewhat to her urgency/ urge incontinece as she may not be getting appropriate signals to tell her when to urinate until it is too late. Did discuss she is emptying well. Would like annual upper tract imaging given her underlying MS.     She was in quite a rush today, having some family issues, and some general concerns about where she is going to live. She had two small children with her and stated they needed to get back to [facility] to drop one off. I offered a pelvic examination, but she declined for now. We also discussed a medication for urinary urgency/ urge incontinence, but she didn't want to try this.     Plan:  - manage constipation (consider miralax)  - timed and double voiding  - bladder training exercises  - try to limit fluids 3 hours prior to bedtime  - discussed caffeine, alcohol, and spicy foods are bladder irritants and contribute to urinary frequency  - plan follow up in 3-6 months. If no improvement, may consider anticholinergic (reviewed side effects including dry mouth, constipation, urinary retention, confusion). He's going to stop the medication and contact us should he develop any of these symptoms. Understands to go to the ED if he cannot urinate over a 6 hour period.  - schedule rus           Referring Provider:  Dr. Elvera Bicker    PCP:   Dr. Loleta Chance     Reason for Visit:  MS/ Neurogenic bladder?     HPI:   Tonya Wood  is a pleasant 57 y.o. year-old female who is being seen today in consultation at the request of PA Zelasky for neurogenic bladder.      UA 3 + glucose  PVR 28 mls      Neurologic History   -Primary neurologic disease:     Symptom onset in 2020  Disease onset: relapsing, possibly now in the progressive phase  05/21/2022 ANA positive 1;80, which is a non specific titer, ENA negative, RF negative, RPR non reactive, HIV negative, NMO IgG and MOG IgG negative    Mobility: Gets around with walker     Current Bladder Management:    She does feel like she empties her bladder most of the time. Volitionally voids. Does try and double void as well as moves forward to help get the urine out.   Duration of symptoms:    Since 2020    Urinary Symptoms   Frequency: No   Urgency: Occasionally, sometimes has some urge incontinence; 1-2 times per week. Thinks it's more of a mobility issue. Wears 2-3 pads per day.    Nocturia: Occasionally   Enuresis: Almost nightly  Doesn't require sheet change.    Incomplete Emptying: none    Post Void Dribbling: occasionally, a couple times a week    Hesitancy: this happens about once or twice a week    Straining: yes   Postures to void: yes   Intermittency: yes    Fluid intake She states she drinks 1-2 soda a day, no water. Prefers starry.     Urinary Incontinence:  yes   Stress Urinary Incontinence: no   Urgency Urinary Incontinence:  yes   Incontinence: triggers: no   Wearing pads: yes 2-3 times per day    Daily incontinence episodes: yes   Daily pad usage: 2-3 times per day      Urinary Tract Infections: no       Hematuria: no    Urologic Surgery: no    Urologic Medications: no     Urodynamics: None    Imaging: None    Labs:    Latest Reference Range & Units 05/08/23 02:32   Creatinine 0.55 - 1.02 mg/dL 1.61      Latest Reference Range & Units 01/01/23 16:03   Hemoglobin A1c 4.8 - 5.6 % 5.7 (H)   (H): Data is abnormally high    OB/GYN history:    - menopause  (yes)    - hysterectomy: No   - pregnancy: 9, deliveries 7, 5 living children (2 c sections)     Prolapse symptoms: no              - pressure              - bulge    Bowel dysfunction: yes   - Bowel program: takes miralax, sometimes has bms every other day       Depressed, but states she isn't going to harm herself or others.     Today, she reports that she is doing well. Today, she denies fever, chills, chest pain, shortness of breath, cough, wheezing, nausea, vomiting, abdominal pain, flank pain, diarrhea, constipation, dysuria, hematuria, rashes, open wounds, edema.   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    Odontogenic infection of jaw 12/25/2018    Severe cocaine use disorder (CMS-HCC) 05/07/2019    Smoker 12/27/2019    Unsteady gait 06/02/2015       Past surgical history:  Past Surgical History:   Procedure Laterality Date    CESAREAN SECTION      FINGER SURGERY Right     ring finger reattached    FOOT SURGERY Right        Social history:  Social History     Social History Narrative    Taking care of grandchildren in [place]         PSYCHIATRIC HX:     -Current provider(s):  No psychiatric but resources for [employer] given     -Suicide attempts/SIB: Attempts: YES, per chart review 1998 jumped off bridge    -Psych Hospitalizations:  YES, multiple    -Med compliance hx: has not been on psych meds in a long while per patient    -Fa hx suicide: not asked        SUBSTANCE ABUSE HX:     -Current using substance: NO    -Hx w/d sxs: not asked    -Sz Hx: not asked    -DT Hx:not asked  SOCIAL HX:    -Current living environment: Lives with family    -Current support(s): children and parent/s    -Violence (perp): NO    -Access to Firearms: NO       Family history:  family history includes Asthma in her mother; Cancer in her son and another family member.    MEDICATIONS:   Current Outpatient Medications   Medication Sig Dispense Refill    acetaminophen (TYLENOL EXTRA STRENGTH) 500 MG tablet Take 2 tablets (1,000 mg total) by mouth Three (3) times a day. 200 tablet 11    albuterol 2.5 mg /3 mL (0.083 %) nebulizer solution Inhale 1 vial (2.5 mg total) by nebulization every four (4) hours. 540 mL 0    amantadine HCL (SYMMETREL) 100 mg capsule Take 1 capsule (100 mg total) by mouth two (2) times a day. 60 capsule 5    amlodipine-benazepril (LOTREL) 10-20 mg per capsule Take 1 capsule by mouth daily. 30 capsule 5    aspirin (ECOTRIN) 81 MG tablet Take 1 tablet (81 mg total) by mouth daily. 90 tablet 3    blood sugar diagnostic (GLUCOSE BLOOD) Strp Test blood glucose 3 times daily. 50 strip 11    blood-glucose meter,continuous (FREESTYLE LIBRE 3 READER) Misc Use as directed to test blood sugar 1 each 0    blood-glucose sensor (FREESTYLE LIBRE 3 SENSOR) Devi Use to monitor blood glucose levels continuously. Change sensor every 14 days. 6 each 3    cetirizine (ZYRTEC) 10 MG tablet Take 1 tablet (10 mg total) by mouth daily. 90 tablet 3    chlorhexidine (PERIDEX) 0.12 % solution Swish and spit out 15 mls two (2) times a day. 2365 mL 3    cholecalciferol, vitamin D3-1,250 mcg, 50,000 unit,, 1,250 mcg (50,000 unit) capsule Take 1 capsule (1,250 mcg total) by mouth once a week. 24 capsule 0    colchicine (COLCRYS) 0.6 mg tablet Take 1 tablet (0.6 mg total) by mouth daily. 90 tablet 3    cyclobenzaprine (FLEXERIL) 5 MG tablet Take 1 tablet (5 mg total) by mouth Three (3) times a day. 90 tablet 0    diaper,brief,adult,disposable Misc 1 each by Miscellaneous route daily as needed (incontinence). 80 each 1    diclofenac sodium (VOLTAREN) 1 % gel Apply 2 g topically four (4) times a day. 200 g 6    dimethyl fumarate 240 mg CpDR Take 1 capsule (240 mg total) by mouth two (2) times a day. 180 capsule 1    divalproex ER (DEPAKOTE ER) 500 MG extended released 24 hr tablet Take 1 tablet (500 mg total) by mouth daily. 30 tablet 1    doxycycline (VIBRAMYCIN) 100 MG capsule Take 1 capsule (100 mg total) by mouth two (2) times a day for 10 days. 20 capsule 0    dulaglutide (TRULICITY) 1.5 mg/0.5 mL PnIj Inject the contents of 1 pen (1.5 mg total) under the skin every seven (7) days. 6 mL 3    DULoxetine (CYMBALTA) 60 MG capsule Take 1 capsule (60 mg total) by mouth daily. 90 capsule 3    empagliflozin (JARDIANCE) 25 mg tablet Take 1 tablet (25 mg total) by mouth daily. 90 tablet 3    ezetimibe (ZETIA) 10 mg tablet Take 1 tablet (10 mg total) by mouth daily. 90 tablet 3    fluticasone propionate (FLONASE) 50 mcg/actuation nasal spray Use 1 spray into each nostril daily. 16 g 11    fluticasone-umeclidin-vilanter (TRELEGY ELLIPTA) 100-62.5-25 mcg inhaler Inhale 1 puff daily.  60 each 11    food supplemt, lactose-reduced Liqd Take 1 supplement bottle (Ensure or Boost) three times daily when unable to eat solid food. 7110 mL 3    ibuprofen (MOTRIN) 600 MG tablet Take 1 tablet (600 mg total) by mouth every six (6) hours as needed for pain. 30 tablet 0    inhaler, assist devices (OPTICHAMBER, AEROCHAMBER, ADULT) Spcr Use as directed with inhalers 1 each 0    insulin glargine (LANTUS SOLOSTAR U-100 INSULIN) 100 unit/mL (3 mL) injection pen Inject 0.2 mL (20 Units total) under the skin nightly. 15 mL 0    ipratropium-albuterol (DUO-NEB) 0.5-2.5 mg/3 mL nebulizer Inhale 3 mL (contents of one nebule) by nebulization every six (6) hours as needed. 180 mL 3    lancets Misc Test blood glucose 3 times daily. 100 each 11    levothyroxine (SYNTHROID) 100 MCG tablet Take 1 tablet (100 mcg total) by mouth daily. 90 tablet 0    lidocaine-prilocaine (EMLA) 2.5-2.5 % cream Apply topically Three (3) times a day as needed. 30 g 0    magnesium oxide (MAG-OX) 400 mg (241.3 mg elemental magnesium) tablet Take 1 tablet (400 mg total) by mouth daily. 30 tablet 11    miscellaneous medical supply Misc Bed pads for incontinence 100 each 3    naloxone (NARCAN) 4 mg nasal spray Give single spray in one nostril.  Repeat with 2nd device in other nostril every 3 min if no or minimal response until 911 arrives 2 each 11    nicotine (NICODERM CQ) 21 mg/24 hr patch Place 1 patch on the skin daily. 28 patch 2    nicotine polacrilex (NICORETTE) 4 MG gum Chew 1 piece of gum (4 mg total) by mouth every hour as directed as needed for smoking cessation. 110 each 3    ondansetron (ZOFRAN) 4 MG tablet Take 1 tablet (4 mg total) by mouth daily as needed for nausea. 30 tablet 1    oxyCODONE (ROXICODONE) 5 MG immediate release tablet Take 1 tablet (5 mg total) by mouth two (2) times a day as needed for pain (severe pain unresponsive to Tylenol/Ibuprofen). 60 tablet 0    pen needle, diabetic (PEN NEEDLE) 31 gauge x 5/16 (8 mm) Ndle Injection Frequency is 1 time per day 100 each 11    polyethylene glycol (GLYCOLAX) 17 gram/dose powder Mix 17g (measure to line in cap) as directed and drink mixture daily. 510 g 0    pregabalin (LYRICA) 75 MG capsule Take 1 capsule (75 mg total) by mouth Three (3) times a day. 270 capsule 3    QUEtiapine (SEROQUEL) 200 MG tablet Take 1 tablet (200 mg total) by mouth nightly. 90 tablet 3    varenicline (CHANTIX) 1 mg tablet Take 1 tablet (1 mg total) by mouth daily. 30 tablet 3    VENTOLIN HFA 90 mcg/actuation inhaler Inhale 2 puffs by mouth every six (6) hours as needed for wheezing. 18 g 5     No current facility-administered medications for this visit.       Allergies:  Allergies   Allergen Reactions    Grass Pollen-Orchardgrass, Standard     Statins-Hmg-Coa Reductase Inhibitors      Hold statin due to elevated CK       Review of Systems:  Positive for MS/ body pain.  Otherwise, a 10 system ROS questionnaire completed by the patient and reviewed by me was normal.    Physical Exam  Vitals:    06/02/23 0857  BP: 98/61   BP Site: R Arm   BP Position: Sitting   BP Cuff Size: Large   Pulse: 77   Temp: 37.1 ??C (98.7 ??F)   TempSrc: Temporal   Weight: 83.9 kg (185 lb)   Height: 170.2 cm (5' 7)     GENERAL: The patient is a pleasant, female in no acute distress.   HEENT: Normocephalic and atraumatic.   NECK: Supple with trachea midline.   LYMPHATICS: No cervical or supraclavicular lymphadenopathy.   PULMONARY: . Relaxed respiratory effort on room air.   CARDIOVASCULAR: Regular rate   GASTROINTESTINAL: Soft, nontender and nondistended   GENITOURINARY: Declined   SKIN: No signs of cyanosis or clubbing.   NEUROLOGICAL: Grossly intact.   PSYCH: Alert and oriented x 3.        Latest Reference Range & Units 05/08/23 02:32   Creatinine 0.55 - 1.02 mg/dL 1.61

## 2023-06-02 NOTE — Unmapped (Unsigned)
Otolaryngology - Otology/Neurotology New Visit    HPI:  Tonya Wood is a 57 y.o. female is seen in consultation at the request of Dr. Cheral Almas for evaluation of otalgia.        Today, the patient reports intermittent pain of her outer right ear that began approximately 6-8 months ago. She notes that she is not experiencing any pain today. The patient denies, pressure, dizziness, family history of hearing loss, and significant exposure to noise. The patient also denies any concerns related to her hearing.       Noted while in ED w/ hypoglycemia on 8/17.Her complaints about her ear show some likely cartilaginous irregularities on her helix that are nontender, firm, without overlying erythema or warmth. Possibly post traumatic.  No tenderness over the mastoid process, physical exam is not consistent with malignant otitis externa.  I do not think that this merits an emergent ENT evaluation and have encouraged her to follow-up in the outpatient setting to have this reevaluated.     --symptoms of ear infxn? pain/drainage/fullness? color of drainage?   --Onset? duration? Previous hx ear infxn? Hx Tubes?   --aggravating factor (water into ear? getting sick? Recent allergies/nasal congestion/rhinorrhea/PND?)  --previous treatment? PO abx? Hx drops?   --hx eczema/psoriasis?   --Hearing?   --Hx trauma/surgery to ear?      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 200 tablet 11    albuterol 2.5 mg /3 mL (0.083 %) nebulizer solution Inhale 1 vial (2.5 mg total) by nebulization every four (4) hours. 540 mL 0    amantadine HCL (SYMMETREL) 100 mg capsule Take 1 capsule (100 mg total) by mouth two (2) times a day. 60 capsule 5    amlodipine-benazepril (LOTREL) 10-20 mg per capsule Take 1 capsule by mouth daily. 30 capsule 5    aspirin (ECOTRIN) 81 MG tablet Take 1 tablet (81 mg total) by mouth daily. 90 tablet 3    benzonatate (TESSALON) 200 MG capsule Take 1 capsule (200 mg total) by mouth Three (3) times a day as needed for cough for up to 5 days. 15 capsule 0    blood sugar diagnostic (GLUCOSE BLOOD) Strp Test blood glucose 3 times daily. 50 strip 11    blood-glucose meter,continuous (FREESTYLE LIBRE 3 READER)  Misc Use as directed to test blood sugar 1 each 0    blood-glucose sensor (FREESTYLE LIBRE 3 SENSOR) Devi Use to monitor blood glucose levels continuously. Change sensor every 14 days. 6 each 3    cetirizine (ZYRTEC) 10 MG tablet Take 1 tablet (10 mg total) by mouth daily. 90 tablet 3    chlorhexidine (PERIDEX) 0.12 % solution Swish and spit out 15 mls two (2) times a day. 2365 mL 3    cholecalciferol, vitamin D3-1,250 mcg, 50,000 unit,, 1,250 mcg (50,000 unit) capsule Take 1 capsule (1,250 mcg total) by mouth once a week. 24 capsule 0    colchicine (COLCRYS) 0.6 mg tablet Take 1 tablet (0.6 mg total) by mouth daily. 90 tablet 3    cyclobenzaprine (FLEXERIL) 5 MG tablet Take 1 tablet (5 mg total) by mouth Three (3) times a day. 90 tablet 0    diaper,brief,adult,disposable Misc 1 each by Miscellaneous route daily as needed (incontinence). 80 each 1    diclofenac sodium (VOLTAREN) 1 % gel Apply 2 g topically four (4) times a day. 200 g 6    dimethyl fumarate 240 mg CpDR Take 1 capsule (240 mg total) by mouth two (2) times a day. 180 capsule 1    divalproex ER (DEPAKOTE ER) 500 MG extended released 24 hr tablet Take 1 tablet (500 mg total) by mouth daily. 30 tablet 1    doxycycline (VIBRAMYCIN) 100 MG capsule Take 1 capsule (100 mg total) by mouth two (2) times a day for 10 days. 20 capsule 0    dulaglutide (TRULICITY) 1.5 mg/0.5 mL PnIj Inject the contents of 1 pen (1.5 mg total) under the skin every seven (7) days. 6 mL 3    DULoxetine (CYMBALTA) 60 MG capsule Take 1 capsule (60 mg total) by mouth daily. 90 capsule 3    empagliflozin (JARDIANCE) 25 mg tablet Take 1 tablet (25 mg total) by mouth daily. 90 tablet 3    ezetimibe (ZETIA) 10 mg tablet Take 1 tablet (10 mg total) by mouth daily. 90 tablet 3    fluticasone propionate (FLONASE) 50 mcg/actuation nasal spray Use 1 spray into each nostril daily. 16 g 11    fluticasone-umeclidin-vilanter (TRELEGY ELLIPTA) 100-62.5-25 mcg inhaler Inhale 1 puff daily. 60 each 11    food supplemt, lactose-reduced Liqd Take 1 supplement bottle (Ensure or Boost) three times daily when unable to eat solid food. 7110 mL 3    ibuprofen (MOTRIN) 600 MG tablet Take 1 tablet (600 mg total) by mouth every six (6) hours as needed for pain. 30 tablet 0    inhaler, assist devices (OPTICHAMBER, AEROCHAMBER, ADULT) Spcr Use as directed with inhalers 1 each 0    insulin glargine (LANTUS SOLOSTAR U-100 INSULIN) 100 unit/mL (3 mL) injection pen Inject 0.2 mL (20 Units total) under the skin nightly. 15 mL 0    ipratropium-albuterol (DUO-NEB) 0.5-2.5 mg/3 mL nebulizer Inhale 3 mL (contents of one nebule) by nebulization every six (6) hours as needed. 180 mL 3    lancets Misc Test blood glucose 3 times daily. 100 each 11 levothyroxine (SYNTHROID) 100 MCG tablet Take 1 tablet (100 mcg total) by mouth daily. 90 tablet 0    lidocaine-prilocaine (EMLA) 2.5-2.5 % cream Apply topically Three (3) times a day as needed. 30 g 0    magnesium oxide (MAG-OX) 400 mg (241.3 mg elemental magnesium) tablet Take 1 tablet (400 mg total) by mouth daily. 30 tablet 11    miscellaneous medical supply Misc Bed pads  for incontinence 100 each 3    naloxone (NARCAN) 4 mg nasal spray Give single spray in one nostril.  Repeat with 2nd device in other nostril every 3 min if no or minimal response until 911 arrives 2 each 11    nicotine (NICODERM CQ) 21 mg/24 hr patch Place 1 patch on the skin daily. 28 patch 2    nicotine polacrilex (NICORETTE) 4 MG gum Chew 1 piece of gum (4 mg total) by mouth every hour as directed as needed for smoking cessation. 110 each 3    ondansetron (ZOFRAN) 4 MG tablet Take 1 tablet (4 mg total) by mouth daily as needed for nausea. 30 tablet 1    oxyCODONE (ROXICODONE) 5 MG immediate release tablet Take 1 tablet (5 mg total) by mouth two (2) times a day as needed for pain (severe pain unresponsive to Tylenol/Ibuprofen). 60 tablet 0    pen needle, diabetic (PEN NEEDLE) 31 gauge x 5/16 (8 mm) Ndle Injection Frequency is 1 time per day 100 each 11    polyethylene glycol (GLYCOLAX) 17 gram/dose powder Mix 17g (measure to line in cap) as directed and drink mixture daily. 510 g 0    pregabalin (LYRICA) 75 MG capsule Take 1 capsule (75 mg total) by mouth Three (3) times a day. 270 capsule 3    QUEtiapine (SEROQUEL) 200 MG tablet Take 1 tablet (200 mg total) by mouth nightly. 90 tablet 3    varenicline (CHANTIX) 1 mg tablet Take 1 tablet (1 mg total) by mouth daily. 30 tablet 3    VENTOLIN HFA 90 mcg/actuation inhaler Inhale 2 puffs by mouth every six (6) hours as needed for wheezing. 18 g 5     No current facility-administered medications on file prior to visit.       Allergies:  Allergies   Allergen Reactions    Grass Pollen-Orchardgrass, Standard     Statins-Hmg-Coa Reductase Inhibitors      Hold statin due to elevated CK        FH:  Family History   Problem Relation Age of Onset    Asthma Mother     Cancer Son     Cancer Other     Breast cancer Neg Hx        SH:    Social History     Tobacco Use    Smoking status: Every Day     Current packs/day: 2.00     Average packs/day: 2.0 packs/day for 37.6 years (75.2 ttl pk-yrs)     Types: Cigarettes     Start date: 10/17/1985    Smokeless tobacco: Never   Vaping Use    Vaping status: Never Used   Substance Use Topics    Alcohol use: No    Drug use: Never        ROS:  Review of systems was reviewed on attached notes/patient intake forms.     Exam:  There were no vitals taken for this visit.    General: well appearing, stated age, no distress   Head - atraumatic, normocephalic   Nose: dorsum midline, no rhinorrhea  Neck: symmetric, trachea midline  Psychiatric: alert and oriented, appropriate mood and affect   Respiratory: no audible wheezing or stridor, normal work of breathing  Neurologic - cranial nerves 2-12 grossly intact  Facial Strength - HB 1/6 bilaterally    Ears - External ear- normal, no lesions, no malformations   Otoscopy - ***  Tuning fork test - Weber ***  Audiogram: The audiogram was personally reviewed and interpreted. 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       Tympanogram: The tympanogram was personally reviewed and interpreted. This demonstrates type A bilaterally.     Imaging:   MRI Brain w/ and w/o contrast: 05/19/2022  MRI brain personally reviewed and interpreted.  This shows normal inner ear morphology bilaterally, no evidence of CPA or IAC pathology.    CT Head w/o contrast: 02/23/2022  IMPRESSION-  No acute intracranial abnormality       Assessment/Plan:  JENAVEVE SKYBERG is a 57 y.o. female with a history of ***     The patient/family voiced understanding of the plan as detailed above and is in agreement. I appreciate the opportunity to participate in her care.    I attest to the above information and documentation. However, this note has been created using voice recognition software and may have errors that were not dictated and not seen in editing.

## 2023-06-03 DIAGNOSIS — J441 Chronic obstructive pulmonary disease with (acute) exacerbation: Principal | ICD-10-CM

## 2023-06-03 MED ORDER — ALBUTEROL SULFATE 2.5 MG/3 ML (0.083 %) SOLUTION FOR NEBULIZATION
RESPIRATORY_TRACT | 0 refills | 30 days
Start: 2023-06-03 — End: 2024-06-02

## 2023-06-03 MED ORDER — LEVOTHYROXINE 100 MCG TABLET
ORAL_TABLET | Freq: Every day | ORAL | 0 refills | 90 days
Start: 2023-06-03 — End: ?

## 2023-06-03 NOTE — Unmapped (Signed)
Addended by: Luther Hearing on: 06/02/2023 11:07 PM     Modules accepted: Orders

## 2023-06-03 NOTE — Unmapped (Signed)
[facility] Delivery Pharm[facility] Refill Coordination Note    Specialty Medication(s) to be Shipped:   Neurology: Dimethyl fumarate     Other medication(s) to be shipped:  lancets, albuterol nebs, albuterol inhaler, amantadine, lotrel, vitamind, colchicine, test strips, voltaren gel, depakote, flonase, levothyroxine, nicotine patch, nicotine gum, zofran, chantix     Tonya Wood, DOB: 06/18/1966  Phone: [phone] (home)       All above HIPAA information was verified with patient.     Was a Nurse, learning disability used for this call? No    Completed refill call assessment today to schedule patient's medication shipment from the [facility]  [phone]).  All relevant notes have been reviewed.     Specialty medication(s) and dose(s) confirmed: Regimen is correct and unchanged.   Changes to medications: [name] reports no changes at this time.  Changes to insurance: No  New side effects reported not previously addressed with a pharmacist or physician: None reported  Questions for the pharmacist: No    Confirmed patient received a Conservation officer, historic buildings and a Surveyor, mining with first shipment. The patient will receive a drug information handout for each medication shipped and additional FDA Medication Guides as required.       DISEASE/MEDICATION-SPECIFIC INFORMATION        N/A    SPECIALTY MEDICATION ADHERENCE     Medication Adherence    Patient reported X missed doses in the last month: 0  Specialty Medication: dimethyl fumarate 240mg   Patient is on additional specialty medications: No              Were doses missed due to medication being on hold? No    Dimethyl fumarate 240mg   : 2 days of medicine on hand       REFERRAL TO PHARMACIST     Referral to the pharmacist:  message sent to neuro team as sdh unable to fill otc meds, pt aware      SHIPPING     Shipping address confirmed in Epic.       Delivery Scheduled: Yes, Expected medication delivery date: 06/04/2023.  However, Rx request for refills was sent to the provider as there are none remaining.     Medication will be delivered via Same Day Courier to the prescription address in Epic WAM.    Thad Ranger, PharmD   [facility]  Specialty Pharmacist

## 2023-06-04 MED ORDER — LEVOTHYROXINE 100 MCG TABLET
ORAL_TABLET | Freq: Every day | ORAL | 0 refills | 90 days
Start: 2023-06-04 — End: ?

## 2023-06-04 MED ORDER — ALBUTEROL SULFATE 2.5 MG/3 ML (0.083 %) SOLUTION FOR NEBULIZATION
RESPIRATORY_TRACT | 0 refills | 30 days | Status: CP
Start: 2023-06-04 — End: 2024-06-03
  Filled 2023-06-10: qty 540, 30d supply, fill #0

## 2023-06-06 MED FILL — VARENICLINE 1 MG TABLET: ORAL | 30 days supply | Qty: 30 | Fill #2

## 2023-06-06 MED FILL — DICLOFENAC 1 % TOPICAL GEL: TOPICAL | 25 days supply | Qty: 200 | Fill #1

## 2023-06-06 MED FILL — NICOTINE (POLACRILEX) 4 MG GUM: ORAL | 5 days supply | Qty: 110 | Fill #2

## 2023-06-06 MED FILL — AMANTADINE HCL 100 MG CAPSULE: ORAL | 30 days supply | Qty: 60 | Fill #2

## 2023-06-06 MED FILL — VENTOLIN HFA 90 MCG/ACTUATION AEROSOL INHALER: RESPIRATORY_TRACT | 50 days supply | Qty: 36 | Fill #2

## 2023-06-06 MED FILL — DIVALPROEX ER 500 MG TABLET,EXTENDED RELEASE 24 HR: ORAL | 30 days supply | Qty: 30 | Fill #0

## 2023-06-06 MED FILL — AMLODIPINE 10 MG-BENAZEPRIL 20 MG CAPSULE: ORAL | 30 days supply | Qty: 30 | Fill #0

## 2023-06-06 MED FILL — ACCU-CHEK FASTCLIX LANCET DRUM: 34 days supply | Qty: 102 | Fill #3

## 2023-06-06 MED FILL — NICOTINE 21 MG/24 HR DAILY TRANSDERMAL PATCH: TRANSDERMAL | 28 days supply | Qty: 28 | Fill #2

## 2023-06-06 MED FILL — DIMETHYL FUMARATE 240 MG CAPSULE,DELAYED RELEASE: ORAL | 90 days supply | Qty: 180 | Fill #1

## 2023-06-06 MED FILL — ACCU-CHEK GUIDE TEST STRIPS: 34 days supply | Qty: 100 | Fill #3

## 2023-06-06 MED FILL — COLCHICINE 0.6 MG TABLET: ORAL | 30 days supply | Qty: 30 | Fill #0

## 2023-06-06 MED FILL — FLUTICASONE PROPIONATE 50 MCG/ACTUATION NASAL SPRAY,SUSPENSION: NASAL | 60 days supply | Qty: 16 | Fill #1

## 2023-06-06 NOTE — Unmapped (Signed)
[facility]  Care Management Progress Note               Purpose of contact: DME    Cane: Patient said she needs a cane. She has not received one through her insurance before, she has been using her mom's. She has a walker, but it won't fit in the bathroom so she needs a cane.     Incontinence supplies: Patient is now living with her mom and has an address for them to be sent to. PCP needs to update order to say length of need is 1 year.     Housing: Patient would like to move into her own place with her two grandchildren. She said [facility] [employer] has not called her back. SW talked with her about the [employer] and gave her their phone number [phone])    Patient said she is living with her mom in [place] again at the address in her chart ([address] in [place]).     Patient has SW's contact information and SW encouraged patient to call SW with any questions.    Provider/Care Partner(s) to follow up on: Please put in an order for a cane, and update the incontinence supply order to say Length of Need is 1 year.     Shea Stakes, CCM  [facility] Manager  [facility]  [phone]

## 2023-06-09 DIAGNOSIS — Z9181 History of falling: Principal | ICD-10-CM

## 2023-06-09 MED ORDER — DIAPER,BRIEF,ADULT,DISPOSABLE
10 refills | 0 days | Status: CP | PRN
Start: 2023-06-09 — End: 2024-06-08
  Filled 2023-06-19: qty 100, 100d supply, fill #0

## 2023-06-10 MED FILL — ONDANSETRON HCL 4 MG TABLET: ORAL | 30 days supply | Qty: 30 | Fill #1

## 2023-06-10 NOTE — Unmapped (Signed)
Addended by: Luther Hearing on: 06/09/2023 11:38 PM     Modules accepted: Orders

## 2023-06-11 NOTE — Unmapped (Signed)
[facility]  Care Management Progress Note               Purpose of contact: DME - incontinence supplies and cane    SW faxed orders and clinic notes to:  [facility]  [address]  [place], [place] [address]  Phone: [phone]  Fax: [phone]    SW called patient and updated patient. Patient has SW's contact information and SW encouraged patient to call SW with any questions.    Shea Stakes, CCM  [facility] Manager  [facility]  [phone]

## 2023-06-16 DIAGNOSIS — F1190 Opioid use, unspecified, uncomplicated: Principal | ICD-10-CM

## 2023-06-16 DIAGNOSIS — G894 Chronic pain syndrome: Principal | ICD-10-CM

## 2023-06-16 MED ORDER — LEVOTHYROXINE 100 MCG TABLET
ORAL_TABLET | Freq: Every day | ORAL | 0 refills | 90 days
Start: 2023-06-16 — End: ?

## 2023-06-16 NOTE — Unmapped (Signed)
[facility] SUBJECT FOR NEW MSG: Medication Request Patient Name: Tonya Wood   Caller: Self (Patient)  Name of Caller:  Have you contacted your pharmacy? no      Last seen in-person: 05/20/2023  Last telemedicine visit: 08/21/2022        Medication Name: oxyCODONE (ROXICODONE)   Dosage:5 MG immediate release tablet   Route: Oral (PO)  Frequency: As Needed (PRN)  Day Supply Requested: 30  Pharmacy (Name & Address): [facility]   [address] [place] [address]    Pharmacy Phone Number: [phone]   Are there refills on medications? no  Has [facility] refilled this medication before? Yes

## 2023-06-16 NOTE — Unmapped (Signed)
[facility] Specialty and Home Delivery Pharmacy Refill Coordination Note    Specialty Lite Medication(s) to be Shipped:   Trulicity    Other medication(s) to be shipped:  tylenol , pen needles , vitamin d3 , levothyroxine and lyrica     Tonya Wood, DOB: [birth year]  Phone: [phone] (home)       All above HIPAA information was verified with patient.     Was a Nurse, learning disability used for this call? No    Changes to medications: [name] reports no changes at this time.  Changes to insurance: No      REFERRAL TO PHARMACIST     Referral to the pharmacist: Not needed      [facility]     Shipping address confirmed in Epic.     Delivery Scheduled: Yes, Expected medication delivery date: 06/19/23.     Medication will be delivered via Same Day Courier to the prescription address in Epic WAM.    [name]   [facility] Specialty Technician

## 2023-06-18 MED ORDER — LEVOTHYROXINE 100 MCG TABLET
ORAL_TABLET | Freq: Every day | ORAL | 2 refills | 90 days | Status: CP
Start: 2023-06-18 — End: ?
  Filled 2023-06-19: qty 90, 90d supply, fill #0

## 2023-06-19 MED FILL — TRULICITY 1.5 MG/0.5 ML SUBCUTANEOUS PEN INJECTOR: SUBCUTANEOUS | 28 days supply | Qty: 2 | Fill #3

## 2023-06-19 MED FILL — PREGABALIN 75 MG CAPSULE: ORAL | 30 days supply | Qty: 90 | Fill #2

## 2023-06-19 MED FILL — ACETAMINOPHEN 500 MG TABLET: ORAL | 34 days supply | Qty: 200 | Fill #2

## 2023-06-20 MED ORDER — OXYCODONE 5 MG TABLET
ORAL_TABLET | Freq: Two times a day (BID) | ORAL | 0 refills | 30 days | Status: CP | PRN
Start: 2023-06-20 — End: 2023-07-20
  Filled 2023-06-19: qty 60, 30d supply, fill #0

## 2023-06-22 NOTE — Unmapped (Unsigned)
Otolaryngology - Otology/Neurotology New Visit    HPI:  Tonya Wood is a 57 y.o. female is seen in consultation at the request of Dr. Cheral Almas for evaluation of otalgia.        Today, the patient reports intermittent pain of her outer right ear that began approximately 6-8 months ago. She notes that she is not experiencing any pain today. The patient denies, pressure, dizziness, family history of hearing loss, and significant exposure to noise. The patient also denies any concerns related to her hearing.       Noted while in ED w/ hypoglycemia on 8/17.Her complaints about her ear show some likely cartilaginous irregularities on her helix that are nontender, firm, without overlying erythema or warmth. Possibly post traumatic.  No tenderness over the mastoid process, physical exam is not consistent with malignant otitis externa.  I do not think that this merits an emergent ENT evaluation and have encouraged her to follow-up in the outpatient setting to have this reevaluated.     --symptoms of ear infxn? pain/drainage/fullness? color of drainage?   --Onset? duration? Previous hx ear infxn? Hx Tubes?   --aggravating factor (water into ear? getting sick? Recent allergies/nasal congestion/rhinorrhea/PND?)  --previous treatment? PO abx? Hx drops?   --hx eczema/psoriasis?   --Hearing?   --Hx trauma/surgery to ear?      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 200 tablet 11    albuterol 2.5 mg /3 mL (0.083 %) nebulizer solution Inhale 1 vial (2.5 mg total) by nebulization every four (4) hours. 540 mL 0    amantadine HCL (SYMMETREL) 100 mg capsule Take 1 capsule (100 mg total) by mouth two (2) times a day. 60 capsule 5    amlodipine-benazepril (LOTREL) 10-20 mg per capsule Take 1 capsule by mouth daily. 30 capsule 5    aspirin (ECOTRIN) 81 MG tablet Take 1 tablet (81 mg total) by mouth daily. 90 tablet 3    [EXPIRED] benzonatate (TESSALON) 200 MG capsule Take 1 capsule (200 mg total) by mouth Three (3) times a day as needed for cough for up to 5 days. 15 capsule 0    blood sugar diagnostic (GLUCOSE BLOOD) Strp Test blood glucose 3 times daily. 50 strip 11    blood-glucose meter,continuous (FREESTYLE LIBRE 3  READER) Misc Use as directed to test blood sugar 1 each 0    blood-glucose sensor (FREESTYLE LIBRE 3 SENSOR) Devi Use to monitor blood glucose levels continuously. Change sensor every 14 days. 6 each 3    cetirizine (ZYRTEC) 10 MG tablet Take 1 tablet (10 mg total) by mouth daily. 90 tablet 3    chlorhexidine (PERIDEX) 0.12 % solution Swish and spit out 15 mls two (2) times a day. 2365 mL 3    cholecalciferol, vitamin D3-1,250 mcg, 50,000 unit,, 1,250 mcg (50,000 unit) capsule Take 1 capsule (1,250 mcg total) by mouth once a week. 24 capsule 0    colchicine (COLCRYS) 0.6 mg tablet Take 1 tablet (0.6 mg total) by mouth daily. 90 tablet 3    [EXPIRED] cyclobenzaprine (FLEXERIL) 5 MG tablet Take 1 tablet (5 mg total) by mouth Three (3) times a day. 90 tablet 0    diaper,brief,adult,disposable Misc 1 each by Miscellaneous route daily as needed (incontinence). 80 each 10    diclofenac sodium (VOLTAREN) 1 % gel Apply 2 g topically four (4) times a day. 200 g 6    dimethyl fumarate 240 mg CpDR Take 1 capsule (240 mg total) by mouth two (2) times a day. 180 capsule 1    divalproex ER (DEPAKOTE ER) 500 MG extended released 24 hr tablet Take 1 tablet (500 mg total) by mouth daily. 30 tablet 1    [EXPIRED] doxycycline (VIBRAMYCIN) 100 MG capsule Take 1 capsule (100 mg total) by mouth two (2) times a day for 10 days. 20 capsule 0    dulaglutide (TRULICITY) 1.5 mg/0.5 mL PnIj Inject the contents of 1 pen (1.5 mg total) under the skin every seven (7) days. 6 mL 3    DULoxetine (CYMBALTA) 60 MG capsule Take 1 capsule (60 mg total) by mouth daily. 90 capsule 3    empagliflozin (JARDIANCE) 25 mg tablet Take 1 tablet (25 mg total) by mouth daily. 90 tablet 3    ezetimibe (ZETIA) 10 mg tablet Take 1 tablet (10 mg total) by mouth daily. 90 tablet 3    fluticasone propionate (FLONASE) 50 mcg/actuation nasal spray Use 1 spray into each nostril daily. 16 g 11    fluticasone-umeclidin-vilanter (TRELEGY ELLIPTA) 100-62.5-25 mcg inhaler Inhale 1 puff daily. 60 each 11    food supplemt, lactose-reduced Liqd Take 1 supplement bottle (Ensure or Boost) three times daily when unable to eat solid food. 7110 mL 3    ibuprofen (MOTRIN) 600 MG tablet Take 1 tablet (600 mg total) by mouth every six (6) hours as needed for pain. 30 tablet 0    inhaler, assist devices (OPTICHAMBER, AEROCHAMBER, ADULT) Spcr Use as directed with inhalers 1 each 0    insulin glargine (LANTUS SOLOSTAR U-100 INSULIN) 100 unit/mL (3 mL) injection pen Inject 0.2 mL (20 Units total) under the skin nightly. 15 mL 0    ipratropium-albuterol (DUO-NEB) 0.5-2.5 mg/3 mL nebulizer Inhale 3 mL (contents of one nebule) by nebulization every six (6) hours as needed. 180 mL 3    lancets Misc Test blood glucose 3 times daily. 100 each 11    levothyroxine (SYNTHROID) 100 MCG tablet Take 1 tablet (100 mcg total) by mouth daily. 90 tablet 2    lidocaine-prilocaine (EMLA) 2.5-2.5 % cream Apply topically Three (3) times a day as needed. 30 g 0    magnesium oxide (MAG-OX) 400 mg (241.3 mg elemental magnesium) tablet Take 1 tablet (400 mg total) by mouth daily. 30 tablet 11  miscellaneous medical supply Misc Bed pads for incontinence 100 each 3    naloxone (NARCAN) 4 mg nasal spray Give single spray in one nostril.  Repeat with 2nd device in other nostril every 3 min if no or minimal response until 911 arrives 2 each 11    nicotine (NICODERM CQ) 21 mg/24 hr patch Place 1 patch on the skin daily. 28 patch 2    nicotine polacrilex (NICORETTE) 4 MG gum Chew 1 piece of gum (4 mg total) by mouth every hour as directed as needed for smoking cessation. 110 each 3    ondansetron (ZOFRAN) 4 MG tablet Take 1 tablet (4 mg total) by mouth daily as needed for nausea. 30 tablet 1    oxyCODONE (ROXICODONE) 5 MG immediate release tablet Take 1 tablet (5 mg total) by mouth two (2) times a day as needed for pain (severe pain unresponsive to Tylenol/Ibuprofen). 60 tablet 0    pen needle, diabetic (PEN NEEDLE) 31 gauge x 5/16 (8 mm) Ndle Injection Frequency is 1 time per day 100 each 11    polyethylene glycol (GLYCOLAX) 17 gram/dose powder Mix 17g (measure to line in cap) as directed and drink mixture daily. 510 g 0    pregabalin (LYRICA) 75 MG capsule Take 1 capsule (75 mg total) by mouth Three (3) times a day. 270 capsule 3    QUEtiapine (SEROQUEL) 200 MG tablet Take 1 tablet (200 mg total) by mouth nightly. 90 tablet 3    varenicline (CHANTIX) 1 mg tablet Take 1 tablet (1 mg total) by mouth daily. 30 tablet 3    VENTOLIN HFA 90 mcg/actuation inhaler Inhale 2 puffs by mouth every six (6) hours as needed for wheezing. 18 g 5     No current facility-administered medications on file prior to visit.       Allergies:  Allergies   Allergen Reactions    Grass Pollen-Orchardgrass, Standard     Statins-Hmg-Coa Reductase Inhibitors      Hold statin due to elevated CK        FH:  Family History   Problem Relation Age of Onset    Asthma Mother     Cancer Son     Cancer Other     Breast cancer Neg Hx        SH:    Social History     Tobacco Use    Smoking status: Every Day     Current packs/day: 2.00     Average packs/day: 2.0 packs/day for 37.7 years (75.4 ttl pk-yrs)     Types: Cigarettes     Start date: 10/17/1985    Smokeless tobacco: Never   Vaping Use    Vaping status: Never Used   Substance Use Topics    Alcohol use: No    Drug use: Never        ROS:  Review of systems was reviewed on attached notes/patient intake forms.     Exam:  There were no vitals taken for this visit.    General: well appearing, stated age, no distress   Head - atraumatic, normocephalic   Nose: dorsum midline, no rhinorrhea  Neck: symmetric, trachea midline  Psychiatric: alert and oriented, appropriate mood and affect   Respiratory: no audible wheezing or stridor, normal work of breathing  Neurologic - cranial nerves 2-12 grossly intact  Facial Strength - HB 1/6 bilaterally    Ears - External ear- normal, no lesions, no malformations   Otoscopy - ***  Tuning fork  test - Weber ***    Audiogram: The audiogram was personally reviewed and interpreted. 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       Tympanogram: The tympanogram was personally reviewed and interpreted. This demonstrates type A bilaterally.     Imaging:   MRI Brain w/ and w/o contrast: 05/19/2022  MRI brain personally reviewed and interpreted.  This shows normal inner ear morphology bilaterally, no evidence of CPA or IAC pathology.    CT Head w/o contrast: 02/23/2022  IMPRESSION-  No acute intracranial abnormality       Assessment/Plan:  Tonya Wood is a 57 y.o. female with a history of ***     The patient/family voiced understanding of the plan as detailed above and is in agreement. I appreciate the opportunity to participate in her care.    I attest to the above information and documentation. However, this note has been created using voice recognition software and may have errors that were not dictated and not seen in editing.

## 2023-06-25 NOTE — Unmapped (Signed)
[facility]  Care Management Progress Note               Purpose of contact: DME and PCS     SW called [facility] (phone: [phone]): They requested SW refax the orders (cane and incontinence supplies). SW refaxed as requested (sent from 9/25 note).     SW called patient - she said [employer] - they called her about the cane and said she had to pick it up, they did not mention the incontinence supplies. SW called [facility] - patient has to pick up the cane, they do not deliver, and the incontinence supply order goes to a different team that will contact her separately.     Patient said she has not heard from [facility]. SW faxed PCS form to [facility] on 8/29. SW said patient should call [facility] directly to ask about the status.    Shea Stakes, CCM  [facility] Manager  [facility]  [phone]

## 2023-06-26 ENCOUNTER — Ambulatory Visit
Admit: 2023-06-26 | Discharge: 2023-06-26 | Disposition: A | Discharge status: Home / Self Care | Payer: PRIVATE HEALTH INSURANCE | Attending: Family

## 2023-06-26 ENCOUNTER — Emergency Department
Admit: 2023-06-26 | Discharge: 2023-06-26 | Disposition: A | Discharge status: Home / Self Care | Payer: PRIVATE HEALTH INSURANCE | Attending: Family

## 2023-06-26 DIAGNOSIS — J441 Chronic obstructive pulmonary disease with (acute) exacerbation: Principal | ICD-10-CM

## 2023-06-26 LAB — CBC W/ AUTO DIFF
BASOPHILS ABSOLUTE COUNT: 0 10*9/L (ref 0.0–0.1)
BASOPHILS RELATIVE PERCENT: 0.7 %
EOSINOPHILS ABSOLUTE COUNT: 0 10*9/L (ref 0.0–0.5)
EOSINOPHILS RELATIVE PERCENT: 0.9 %
HEMATOCRIT: 44.1 % — ABNORMAL HIGH (ref 34.0–44.0)
HEMOGLOBIN: 14.9 g/dL (ref 11.3–14.9)
LYMPHOCYTES ABSOLUTE COUNT: 0.8 10*9/L — ABNORMAL LOW (ref 1.1–3.6)
LYMPHOCYTES RELATIVE PERCENT: 17.2 %
MEAN CORPUSCULAR HEMOGLOBIN CONC: 33.8 g/dL (ref 32.0–36.0)
MEAN CORPUSCULAR HEMOGLOBIN: 29.5 pg (ref 25.9–32.4)
MEAN CORPUSCULAR VOLUME: 87.3 fL (ref 77.6–95.7)
MEAN PLATELET VOLUME: 8.1 fL (ref 6.8–10.7)
MONOCYTES ABSOLUTE COUNT: 0.3 10*9/L (ref 0.3–0.8)
MONOCYTES RELATIVE PERCENT: 6.2 %
NEUTROPHILS ABSOLUTE COUNT: 3.7 10*9/L (ref 1.8–7.8)
NEUTROPHILS RELATIVE PERCENT: 75 %
NUCLEATED RED BLOOD CELLS: 0 /100{WBCs} (ref <=4)
PLATELET COUNT: 214 10*9/L (ref 150–450)
RED BLOOD CELL COUNT: 5.05 10*12/L (ref 3.95–5.13)
RED CELL DISTRIBUTION WIDTH: 13.9 % (ref 12.2–15.2)
WBC ADJUSTED: 4.9 10*9/L (ref 3.6–11.2)

## 2023-06-26 LAB — BASIC METABOLIC PANEL
ANION GAP: 9 mmol/L (ref 5–14)
BLOOD UREA NITROGEN: 10 mg/dL (ref 9–23)
BUN / CREAT RATIO: 20
CALCIUM: 9.9 mg/dL (ref 8.7–10.4)
CHLORIDE: 109 mmol/L — ABNORMAL HIGH (ref 98–107)
CO2: 26.1 mmol/L (ref 20.0–31.0)
CREATININE: 0.49 mg/dL — ABNORMAL LOW
EGFR CKD-EPI (2021) FEMALE: >90 mL/min/{1.73_m2} (ref >=60)
GLUCOSE RANDOM: 182 mg/dL — ABNORMAL HIGH (ref 70–179)
POTASSIUM: 3.9 mmol/L (ref 3.4–4.8)
SODIUM: 144 mmol/L (ref 135–145)

## 2023-06-26 LAB — HIGH SENSITIVITY TROPONIN I - SINGLE: HIGH SENSITIVITY TROPONIN I: <3 ng/L (ref <=34)

## 2023-06-26 MED ORDER — ALBUTEROL SULFATE HFA 90 MCG/ACTUATION AEROSOL INHALER
Freq: Four times a day (QID) | RESPIRATORY_TRACT | 0 refills | 12 days | Status: CP | PRN
Start: 2023-06-26 — End: 2024-06-25
  Filled 2023-07-21: qty 18, 25d supply, fill #0

## 2023-06-26 MED ORDER — OXYCODONE 5 MG TABLET
ORAL_TABLET | ORAL | 0 refills | 2 days | Status: CP | PRN
Start: 2023-06-26 — End: 2023-07-01
  Filled 2023-06-26: qty 10, 2d supply, fill #0

## 2023-06-26 MED ORDER — LEVOFLOXACIN 750 MG TABLET
ORAL_TABLET | Freq: Every day | ORAL | 0 refills | 7 days | Status: CP
Start: 2023-06-26 — End: 2023-07-03
  Filled 2023-06-26: qty 7, 7d supply, fill #0

## 2023-06-26 MED ADMIN — dexAMETHasone oral use (DECADRON) 4 mg: 4 mg | ORAL | @ 16:00:00 | Stop: 2023-06-26

## 2023-06-26 NOTE — Unmapped (Signed)
[facility]  Emergency Department Provider Note     ED Clinical Impression     Final diagnoses:   COPD exacerbation (CMS-HCC) (Primary)      Impression, Medical Decision Making, ED Course     Impression: 57 y.o. female who has a past medical history of AKI (acute kidney injury) (CMS-HCC) (04/22/2019), Bronchitis, COPD (chronic obstructive pulmonary disease) (CMS-HCC), Depression, Diabetes (CMS-HCC) (05/19/2019), Diabetes mellitus (CMS-HCC), Emphysema lung (CMS-HCC), Hyperglycemic hyperosmolar nonketotic coma (CMS-HCC) (04/22/2019), Hypertension, Hyponatremia (04/22/2019), MDD (major depressive disorder), severe (CMS-HCC) (05/19/2019), Moderate episode of recurrent major depressive disorder (CMS-HCC) (12/28/2021), Odontogenic infection of jaw (12/25/2018), Severe cocaine use disorder (CMS-HCC) (05/07/2019), Smoker (12/27/2019), and Unsteady gait (06/02/2015). who presents with cough with productive sputum and shortness of breath in the setting of COPD as described below.       DDx/MDM:     Patient presents with vague shortness of breath/dyspnea.  Differential diagnosis is broad.  Will obtain troponin and ECG to evaluate for cardiac ischemia or other cardiac causes,  CBC to evaluate for anemia, electrolyte panel to evaluate for renal dysfunction or other electrolyte abnormalities, and chest x-ray to evaluate for pneumonia, pneumothorax.      Negative chest film negative troponin and other serum studies returned reassuring at this time.  Will treat for COPD with acute exacerbation.  Will provide new albuterol inhaler, treat with Levaquin outpatient, and provide short course of pain medication as she was brought down from [place] by her son and left all pain medication and albuterol inhaler at home.  She is otherwise hemodynamically stable and appropriate for discharge at this time.    Diagnostic workup as below. Will treat patient with Decadron.    Orders Placed This Encounter   Procedures    XR Chest 2 views    CBC w/ Differential    Basic Metabolic Panel    HS Troponin 0h    ECG 12 Lead    Insert peripheral IV            Strict return precautions were reviewed with the patient. The patient was advised to return to the emergency department with any troubling symptoms. I advised the patient to follow up with her primary care doctor within one week for re-evaluation and to assure improvement in symptoms. The patient is comfortable with this plan and will be discharged.      History     Chief Complaint  Chief Complaint   Patient presents with    Cough       HPI   Tonya Wood is a 57 y.o. female with past medical history as below who presents with cough shortness of breath in the setting of COPD.  Multiple episodes of acute exacerbation of COPD.        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/28/2021    Odontogenic infection of jaw 12/25/2018    Severe cocaine use disorder (CMS-HCC) 05/07/2019    Smoker 12/27/2019    Unsteady gait  06/02/2015       Past Surgical History:   Procedure Laterality Date    CESAREAN SECTION      FINGER SURGERY Right     ring finger reattached    FOOT SURGERY Right          Current Facility-Administered Medications:     dexAMETHasone (DECADRON) 4 mg/mL injection, , , ,     Current Outpatient Medications:     acetaminophen (TYLENOL EXTRA STRENGTH) 500 MG tablet, Take 2 tablets (1,000 mg total) by mouth Three (3) times a day., Disp: 200 tablet, Rfl: 11    albuterol 2.5 mg /3 mL (0.083 %) nebulizer solution, Inhale 1 vial (2.5 mg total) by nebulization every four (4) hours., Disp: 540 mL, Rfl: 0    albuterol HFA 90 mcg/actuation inhaler, Inhale 2 puffs by mouth every six (6) hours as needed., Disp: 8.5 g, Rfl: 0    amantadine HCL (SYMMETREL) 100 mg capsule, Take 1 capsule (100 mg total) by mouth two (2) times a day., Disp: 60 capsule, Rfl: 5    amlodipine-benazepril (LOTREL) 10-20 mg per capsule, Take 1 capsule by mouth daily., Disp: 30 capsule, Rfl: 5    aspirin (ECOTRIN) 81 MG tablet, Take 1 tablet (81 mg total) by mouth daily., Disp: 90 tablet, Rfl: 3    blood sugar diagnostic (GLUCOSE BLOOD) Strp, Test blood glucose 3 times daily., Disp: 50 strip, Rfl: 11    blood-glucose meter,continuous (FREESTYLE LIBRE 3 READER) Misc, Use as directed to test blood sugar, Disp: 1 each, Rfl: 0    blood-glucose sensor (FREESTYLE LIBRE 3 SENSOR) Devi, Use to monitor blood glucose levels continuously. Change sensor every 14 days., Disp: 6 each, Rfl: 3    cetirizine (ZYRTEC) 10 MG tablet, Take 1 tablet (10 mg total) by mouth daily., Disp: 90 tablet, Rfl: 3    chlorhexidine (PERIDEX) 0.12 % solution, Swish and spit out 15 mls two (2) times a day., Disp: 2365 mL, Rfl: 3    cholecalciferol, vitamin D3-1,250 mcg, 50,000 unit,, 1,250 mcg (50,000 unit) capsule, Take 1 capsule (1,250 mcg total) by mouth once a week., Disp: 24 capsule, Rfl: 0    colchicine (COLCRYS) 0.6 mg tablet, Take 1 tablet (0.6 mg total) by mouth daily., Disp: 90 tablet, Rfl: 3    diaper,brief,adult,disposable Misc, 1 each by Miscellaneous route daily as needed (incontinence)., Disp: 80 each, Rfl: 10    diclofenac sodium (VOLTAREN) 1 % gel, Apply 2 g topically four (4) times a day., Disp: 200 g, Rfl: 6    dimethyl fumarate 240 mg CpDR, Take 1 capsule (240 mg total) by mouth two (2) times a day., Disp: 180 capsule, Rfl: 1    divalproex ER (DEPAKOTE ER) 500 MG extended released 24 hr tablet, Take 1 tablet (500 mg total) by mouth daily., Disp: 30 tablet, Rfl: 1    dulaglutide (TRULICITY) 1.5 mg/0.5 mL PnIj, Inject the contents of 1 pen (1.5 mg total) under the skin every seven (7) days., Disp: 6 mL, Rfl: 3    DULoxetine (CYMBALTA) 60 MG capsule, Take 1 capsule (60 mg total) by mouth daily., Disp: 90 capsule, Rfl: 3    empagliflozin (JARDIANCE) 25 mg tablet, Take 1 tablet (25 mg total) by mouth daily., Disp: 90 tablet, Rfl: 3    ezetimibe (ZETIA) 10 mg tablet, Take 1 tablet (10 mg total) by mouth daily., Disp: 90 tablet, Rfl: 3    fluticasone propionate (FLONASE) 50 mcg/actuation nasal spray, Use 1 spray into each nostril daily., Disp:  16 g, Rfl: 11    fluticasone-umeclidin-vilanter (TRELEGY ELLIPTA) 100-62.5-25 mcg inhaler, Inhale 1 puff daily., Disp: 60 each, Rfl: 11    food supplemt, lactose-reduced Liqd, Take 1 supplement bottle (Ensure or Boost) three times daily when unable to eat solid food., Disp: 7110 mL, Rfl: 3    ibuprofen (MOTRIN) 600 MG tablet, Take 1 tablet (600 mg total) by mouth every six (6) hours as needed for pain., Disp: 30 tablet, Rfl: 0    inhaler, assist devices (OPTICHAMBER, AEROCHAMBER, ADULT) Spcr, Use as directed with inhalers, Disp: 1 each, Rfl: 0    insulin glargine (LANTUS SOLOSTAR U-100 INSULIN) 100 unit/mL (3 mL) injection pen, Inject 0.2 mL (20 Units total) under the skin nightly., Disp: 15 mL, Rfl: 0    ipratropium-albuterol (DUO-NEB) 0.5-2.5 mg/3 mL nebulizer, Inhale 3 mL (contents of one nebule) by nebulization every six (6) hours as needed., Disp: 180 mL, Rfl: 3    lancets Misc, Test blood glucose 3 times daily., Disp: 100 each, Rfl: 11    levoFLOXacin (LEVAQUIN) 750 MG tablet, Take 1 tablet (750 mg total) by mouth daily for 7 doses., Disp: 7 tablet, Rfl: 0    levothyroxine (SYNTHROID) 100 MCG tablet, Take 1 tablet (100 mcg total) by mouth daily., Disp: 90 tablet, Rfl: 2    lidocaine-prilocaine (EMLA) 2.5-2.5 % cream, Apply topically Three (3) times a day as needed., Disp: 30 g, Rfl: 0    magnesium oxide (MAG-OX) 400 mg (241.3 mg elemental magnesium) tablet, Take 1 tablet (400 mg total) by mouth daily., Disp: 30 tablet, Rfl: 11    miscellaneous medical supply Misc, Bed pads for incontinence, Disp: 100 each, Rfl: 3    naloxone (NARCAN) 4 mg nasal spray, Give single spray in one nostril.  Repeat with 2nd device in other nostril every 3 min if no or minimal response until 911 arrives, Disp: 2 each, Rfl: 11    nicotine (NICODERM CQ) 21 mg/24 hr patch, Place 1 patch on the skin daily., Disp: 28 patch, Rfl: 2    nicotine polacrilex (NICORETTE) 4 MG gum, Chew 1 piece of gum (4 mg total) by mouth every hour as directed as needed for smoking cessation., Disp: 110 each, Rfl: 3    ondansetron (ZOFRAN) 4 MG tablet, Take 1 tablet (4 mg total) by mouth daily as needed for nausea., Disp: 30 tablet, Rfl: 1    oxyCODONE (ROXICODONE) 5 MG immediate release tablet, Take 1 tablet (5 mg total) by mouth every four (4) hours as needed for pain for up to 5 days., Disp: 10 tablet, Rfl: 0    pen needle, diabetic (PEN NEEDLE) 31 gauge x 5/16 (8 mm) Ndle, Injection Frequency is 1 time per day, Disp: 100 each, Rfl: 11    polyethylene glycol (GLYCOLAX) 17 gram/dose powder, Mix 17g (measure to line in cap) as directed and drink mixture daily., Disp: 510 g, Rfl: 0    pregabalin (LYRICA) 75 MG capsule, Take 1 capsule (75 mg total) by mouth Three (3) times a day., Disp: 270 capsule, Rfl: 3    QUEtiapine (SEROQUEL) 200 MG tablet, Take 1 tablet (200 mg total) by mouth nightly., Disp: 90 tablet, Rfl: 3    varenicline (CHANTIX) 1 mg tablet, Take 1 tablet (1 mg total) by mouth daily., Disp: 30 tablet, Rfl: 3    Allergies  Grass pollen-orchardgrass, standard and Statins-hmg-coa reductase inhibitors    Family History  Family History   Problem Relation Age of Onset    Asthma Mother  Cancer Son     Cancer Other     Breast cancer Neg Hx        Social History  Social History     Tobacco Use    Smoking status: Every Day     Current packs/day: 2.00     Average packs/day: 2.0 packs/day for 37.7 years (75.4 ttl pk-yrs) Types: Cigarettes     Start date: 10/17/1985    Smokeless tobacco: Never   Vaping Use    Vaping status: Never Used   Substance Use Topics    Alcohol use: No    Drug use: Never        Physical Exam     VITAL SIGNS:      Vitals:    06/26/23 1002 06/26/23 1007   BP: 126/85    Pulse: 106    Resp: 20    Temp:  36.4 ??C (97.6 ??F)   TempSrc:  Oral   SpO2: 99%    Weight: 86.5 kg (190 lb 9.6 oz)        Constitutional: Alert and oriented. No acute distress.  Eyes: Conjunctivae are normal.  HEENT: Normocephalic and atraumatic. Conjunctivae clear. No congestion. Moist mucous membranes.   Cardiovascular: Rate as above, regular rhythm. Normal and symmetric distal pulses. Brisk capillary refill. Normal skin turgor.  Respiratory: Normal respiratory effort. Breath sounds are normal. There are no wheezing or crackles heard.  Gastrointestinal: Soft, non-distended, non-tender.  Genitourinary: Deferred.  Musculoskeletal: Non-tender with normal range of motion in all extremities.  Neurologic: Normal speech and language. No gross focal neurologic deficits are appreciated. Patient is moving all extremities equally, face is symmetric at rest and with speech.  Skin: Skin is warm, dry and intact. No rash noted.  Psychiatric: Mood and affect are normal. Speech and behavior are normal.     Radiology     XR Chest 2 views   Final Result      Negative chest.          Pertinent labs & imaging results that were available during my care of the patient were independently interpreted by me and considered in my medical decision making (see chart for details).    Portions of this record have been created using Scientist, clinical (histocompatibility and immunogenetics). Dictation errors have been sought, but may not have been identified and corrected.         Loman Brooklyn, FNP  06/26/23 1254

## 2023-06-26 NOTE — Unmapped (Signed)
Pt stating an ongoing chest cold for a month. She has seen her doctor and was treated with antibiotics. Pt stating she is coughing up dark green. Current smoker.

## 2023-06-27 ENCOUNTER — Ambulatory Visit: Admit: 2023-06-27 | Discharge: 2023-06-28 | Payer: PRIVATE HEALTH INSURANCE

## 2023-06-27 DIAGNOSIS — M545 Low back pain of over 3 months duration: Principal | ICD-10-CM

## 2023-06-27 DIAGNOSIS — M1712 Unilateral primary osteoarthritis, left knee: Principal | ICD-10-CM

## 2023-06-27 DIAGNOSIS — M542 Cervicalgia: Principal | ICD-10-CM

## 2023-06-27 MED ORDER — BACLOFEN 10 MG TABLET
ORAL_TABLET | Freq: Three times a day (TID) | ORAL | 0 refills | 30 days | Status: CP
Start: 2023-06-27 — End: 2023-07-27
  Filled 2023-06-27: qty 90, 30d supply, fill #0

## 2023-06-27 NOTE — Unmapped (Unsigned)
Department of Anesthesiology  [facility]  [address]  [place], [place] [address]  [phone]    Chronic Pain Follow Up Note     Assessment and Plan:   Attending: [name] is a 57 y.o. 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 She [facility] for [facility].    Per chart review, the patient was referred to our clinic by her PCP. Patient with significant chronic pain - multifactorial including possible MS (had not yet seen neuro but was schedule for 08/2022), Knee instability 2/2 meniscal tear and cartilage loss (ortho stated she was not a candidate for TKR), hand and ankle pain (suspect ganglion cyst), and SDOH with increased stressors (Son in ICU and mom sick - traveling from [place] to [place] weekly). Tried suboxone but no benefit. Trialing tylenol and diclofenac. See if butrans patch was available. - Dr. Elisha Headland, MD on 07/12/22    Of note patient's pain is multi-site and likely multifactorial in nature, certainly with degenerative component but also likely to involve autoimmune issues and possibly heyperalgesia. The two primary pain issues the patient would like to address today is low back pain and left knee pain.     Multiple sclerosis (possible)  ***  The patient has been seen by Dr. Champ Mungo (neurology) for evaluation reg possible MS diagnosis. Dr. Champ Mungo is conducting further studies to r/o other neurological conditions, however presence of demyalinating lesions within CNS is concerning for MS. Plan is for the patient to undergo LP for routine CSF + autoimmune myelopathy panel and blood work. The patient will also see neuro-ophthalmology and follow up with Dr. Elvera Bicker afterwards.     Chronic bilateral low back pain   ***  Chronic, not at goal and patient would like to focus on her left knee pain first and revisit low back pain at future visits.   MRI findings suggests facet arthropathy and DDD at lower levels of lumbar spine as well as moderate canal narrowing at L4-5 and multilevel lumbar spondylosis.   - Can consider procedural intervention in the future after addressing left knee pain  - Deferred PE for low back pain as patient was borrowing someone else's car and had to return it quickly    Left knee pain   ***  Chronic, worsening, not at goal and main pain generator at today's visit. Patient with chronic left knee pain was following with Dr. Nedra Hai for conservative therapies as she is not considered a good surgical candidate for left TKA in the light of pain out of proportion to imaging findings with only mild degree of OA. Pain likely generated by soft tissues and ligament laxity. Patient was undergoing viscosupplementation treatment, s/p third injection of series of 3 with no benefit. We have discussed genicular nerve block in hopes of further analgesia and patient is amenable to the block.    - Ordered genicular nerve blocks with steroid. Patient will schedule - January 2024 A1c was 6.3     Urine toxicology: None  Urine toxicology screen N/A appropriate.   Last Opioid Change: N/A  Last EKG: N/A  Previous Compliance Issues: None  Naloxone ordered: N/A  Pain Psychology: Consider referral in the future  NCCSRS database was reviewed 06/26/23.  No diagnosis found.      General Recommendations: The pain condition that the patient suffers from is best treated with a multidisciplinary approach that involves an increase in physical activity to prevent de-conditioning and worsening of the pain cycle, as well as psychological counseling (formal and/or informal) to address the co-morbid psychological affects of pain.  Treatment will often involve judicious use of pain medications and interventional procedures to decrease the pain, allowing the patient to participate in the physical activity that will ultimately produce long-lasting pain reductions.  The goal of the multidisciplinary approach is to return the patient to a higher level of overall function and to restore their ability to perform activities of daily living.    PLAN:   ***  - Complete series of injections with Dr. Nedra Hai and if pain is not improved we can schedule patient for left knee genicular nerve blocks  - Obtain updated A1c with PCP  - Encouraged patient go home and rest and reach out to her PCP regarding new onset cough     Future Considerations:  Left knee genicular nerve block      Subjective:     HPI:  Tonya Wood is seen in consultation at the request of Kirby Crigler, MD  For evaluation and recommendations regarding Her chronic pain.     The patient was last seen in March, at which point the patient reported being seen by Dr. Champ Mungo (neurology) for evaluation reg possible MS diagnosis. Dr. Champ Mungo was conducting further studies to r/o other neurological conditions, however presence of demyalinating lesions within CNS was concerning for MS. Plan was for the patient to undergo LP for routine CSF + autoimmune myelopathy panel and blood work. The patient planned to also see neuro-ophthalmology and follow up with Dr. Elvera Bicker afterwards. Patient wanted to focus on her left knee pain first and revisit low back pain at future visits. Left knee pain was worsening, not at goal and main pain generator. Patient with chronic left knee pain was following with Dr. Nedra Hai for conservative therapies as she was not considered a good surgical candidate for left TKA in the light of pain out of proportion to imaging findings with only mild degree of OA. Pain likely generated by soft tissues and ligament laxity. Patient was undergoing viscosupplementation treatment, s/p third injection of series of 3 with no benefit. We have discussed genicular nerve block in hopes of further analgesia and patient was amenable to the block.     Since last visit, the patient has followed with [facility], Neurology, Ortho, PT and Urology. The patient underwent left knee genicular nerve block with our clinic on 01/07/23. Patient presented to the ED for chest pain on 02/15/23, for suicidal ideation on 05/03/23 and 05/08/23, for shortness of breath on 05/27/23 and for a cough on 06/26/23.      Today, ***    Current Medication Regimen:   Aspirin 81 mg daily  Flexeril 5 mg as needed  Voltaren gel 4x a daily  Cymbalta 60 mg daily   Oxycodone 5 mg immediate release 2x daily  Lyrica 75 mg 3x daily  Naproxen    Pain Clinic? No    The patient states her pain is located *** and the severity of her pain ranges from ***/10 to ***/10.  Her pain currently is ***/10 and on average is ***/10.  She describes the sensation of her pain as {pain described:58928}. Her pain is present {pain frequency:58931} and worst {pain worst:59685}. The patient???s  pain impacts {negatively affected:59709}. Her interval history includes {interval history:59710}. Her pain {pain changed:59711}, and she {does does not blank:47747} have new pain to discuss today. She {is/is not:36772} on blood thinners or anti-coagulants. In regards to medications currently taken for pain management, the patient {is:54246::is} tolerating these medications well and complains of associated side effects: {pain med side effects:59712}.      Previous interventions include PT, Medications, and Injections: left knee gel injections    Previous tests include MRI, XRAYs, and CT scan  Workmans Compensation is not involved.  This is not involved with a lawsuit or lawyer  The treatment goals include Complete resolution with medications if necessary, Support for disability, and Become more active with the family    Previous Medication Trials: Buprenorphine/BuTrans, Buprenorphine/Suboxone/Subutex, Diclofenac gel/voltaren gel, duloxetine/cymbalta, Oxycodone, and Pregabalin/Lyrica    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/28/2021    Odontogenic infection of jaw 12/25/2018    Severe cocaine use disorder (CMS-HCC) 05/07/2019    Smoker 12/27/2019    Unsteady gait 06/02/2015     Past Surgical History:   Procedure Laterality Date    CESAREAN SECTION      FINGER SURGERY Right     ring finger reattached    FOOT SURGERY Right      Family History   Problem Relation Age of Onset    Asthma Mother     Cancer Son     Cancer Other     Breast cancer Neg Hx        Social History:  She reports that she has been smoking cigarettes. She started smoking about 37 years ago. She has a 75.4 pack-year smoking history. She has never used smokeless tobacco. She reports that she does not drink alcohol and does not use drugs.    The patient is widowed  The patient has children: Yes, 5 grown children  The patient is living with children  Highest level of education: High school  Current Employment: Unemployed/part-time, because of pain  Exercise: None  Recreational Drugs: No  Treatment for Substance abuse: No  Use anothers prescription medications: No    Allergies as of 06/27/2023 - Reviewed 06/26/2023   Allergen Reaction Noted    Grass pollen-orchardgrass, standard  01/29/2023    Statins-hmg-coa reductase inhibitors  02/14/2022      Current Outpatient Medications   Medication Sig Dispense Refill    acetaminophen (TYLENOL EXTRA STRENGTH) 500 MG tablet Take 2 tablets (1,000 mg total) by mouth Three (3) times a day. 200 tablet 11    albuterol 2.5 mg /3 mL (0.083 %) nebulizer solution Inhale 1 vial (2.5 mg total) by nebulization every four (4) hours. 540 mL 0    albuterol HFA 90 mcg/actuation inhaler Inhale 2 puffs by mouth every six (6) hours as needed. 8.5 g 0    amantadine HCL (SYMMETREL) 100 mg capsule Take 1 capsule (100 mg total) by mouth two (2) times a day. 60 capsule 5    amlodipine-benazepril (  LOTREL) 10-20 mg per capsule Take 1 capsule by mouth daily. 30 capsule 5    aspirin (ECOTRIN) 81 MG tablet Take 1 tablet (81 mg total) by mouth daily. 90 tablet 3    blood sugar diagnostic (GLUCOSE BLOOD) Strp Test blood glucose 3 times daily. 50 strip 11    blood-glucose meter,continuous (FREESTYLE LIBRE 3 READER) Misc Use as directed to test blood sugar 1 each 0    blood-glucose sensor (FREESTYLE LIBRE 3 SENSOR) Devi Use to monitor blood glucose levels continuously. Change sensor every 14 days. 6 each 3    cetirizine (ZYRTEC) 10 MG tablet Take 1 tablet (10 mg total) by mouth daily. 90 tablet 3    chlorhexidine (PERIDEX) 0.12 % solution Swish and spit out 15 mls two (2) times a day. 2365 mL 3    cholecalciferol, vitamin D3-1,250 mcg, 50,000 unit,, 1,250 mcg (50,000 unit) capsule Take 1 capsule (1,250 mcg total) by mouth once a week. 24 capsule 0    colchicine (COLCRYS) 0.6 mg tablet Take 1 tablet (0.6 mg total) by mouth daily. 90 tablet 3    diaper,brief,adult,disposable Misc 1 each by Miscellaneous route daily as needed (incontinence). 80 each 10    diclofenac sodium (VOLTAREN) 1 % gel Apply 2 g topically four (4) times a day. 200 g 6    dimethyl fumarate 240 mg CpDR Take 1 capsule (240 mg total) by mouth two (2) times a day. 180 capsule 1    divalproex ER (DEPAKOTE ER) 500 MG extended released 24 hr tablet Take 1 tablet (500 mg total) by mouth daily. 30 tablet 1    dulaglutide (TRULICITY) 1.5 mg/0.5 mL PnIj Inject the contents of 1 pen (1.5 mg total) under the skin every seven (7) days. 6 mL 3    DULoxetine (CYMBALTA) 60 MG capsule Take 1 capsule (60 mg total) by mouth daily. 90 capsule 3    empagliflozin (JARDIANCE) 25 mg tablet Take 1 tablet (25 mg total) by mouth daily. 90 tablet 3    ezetimibe (ZETIA) 10 mg tablet Take 1 tablet (10 mg total) by mouth daily. 90 tablet 3    fluticasone propionate (FLONASE) 50 mcg/actuation nasal spray Use 1 spray into each nostril daily. 16 g 11    fluticasone-umeclidin-vilanter (TRELEGY ELLIPTA) 100-62.5-25 mcg inhaler Inhale 1 puff daily. 60 each 11    food supplemt, lactose-reduced Liqd Take 1 supplement bottle (Ensure or Boost) three times daily when unable to eat solid food. 7110 mL 3    ibuprofen (MOTRIN) 600 MG tablet Take 1 tablet (600 mg total) by mouth every six (6) hours as needed for pain. 30 tablet 0    inhaler, assist devices (OPTICHAMBER, AEROCHAMBER, ADULT) Spcr Use as directed with inhalers 1 each 0    insulin glargine (LANTUS SOLOSTAR U-100 INSULIN) 100 unit/mL (3 mL) injection pen Inject 0.2 mL (20 Units total) under the skin nightly. 15 mL 0    ipratropium-albuterol (DUO-NEB) 0.5-2.5 mg/3 mL nebulizer Inhale 3 mL (contents of one nebule) by nebulization every six (6) hours as needed. 180 mL 3    lancets Misc Test blood glucose 3 times daily. 100 each 11    levoFLOXacin (LEVAQUIN) 750 MG tablet Take 1 tablet (750 mg total) by mouth daily for 7 doses. 7 tablet 0    levothyroxine (SYNTHROID) 100 MCG tablet Take 1 tablet (100 mcg total) by mouth daily. 90 tablet 2    lidocaine-prilocaine (EMLA) 2.5-2.5 % cream Apply topically Three (3) times a day as needed. 30  g 0    magnesium oxide (MAG-OX) 400 mg (241.3 mg elemental magnesium) tablet Take 1 tablet (400 mg total) by mouth daily. 30 tablet 11    miscellaneous medical supply Misc Bed pads for incontinence 100 each 3    naloxone (NARCAN) 4 mg nasal spray Give single spray in one nostril.  Repeat with 2nd device in other nostril every 3 min if no or minimal response until 911 arrives 2 each 11    nicotine (NICODERM CQ) 21 mg/24 hr patch Place 1 patch on the skin daily. 28 patch 2    nicotine polacrilex (NICORETTE) 4 MG gum Chew 1 piece of gum (4 mg total) by mouth every hour as directed as needed for smoking cessation. 110 each 3    ondansetron (ZOFRAN) 4 MG tablet Take 1 tablet (4 mg total) by mouth daily as needed for nausea. 30 tablet 1    oxyCODONE (ROXICODONE) 5 MG immediate release tablet Take 1 tablet (5 mg total) by mouth every four (4) hours as needed for pain for up to 5 days. 10 tablet 0    pen needle, diabetic (PEN NEEDLE) 31 gauge x 5/16 (8 mm) Ndle Injection Frequency is 1 time per day 100 each 11    polyethylene glycol (GLYCOLAX) 17 gram/dose powder Mix 17g (measure to line in cap) as directed and drink mixture daily. 510 g 0    pregabalin (LYRICA) 75 MG capsule Take 1 capsule (75 mg total) by mouth Three (3) times a day. 270 capsule 3    QUEtiapine (SEROQUEL) 200 MG tablet Take 1 tablet (200 mg total) by mouth nightly. 90 tablet 3    varenicline (CHANTIX) 1 mg tablet Take 1 tablet (1 mg total) by mouth daily. 30 tablet 3     No current facility-administered medications for this visit.       Imaging/Tests:   XR Left Knee 11/15/22   FINDINGS:   No acute fracture or dislocation. Left knee with further joint space narrowing and subchondral sclerosis of the medial compartment when compared to prior radiographs, now severe. Moderate to large left knee joint effusion.     IMPRESSIONS:   1. Progressed joint space narrowing and subchondral sclerosis of the medial compartment of the left knee when compared to radiographs obtained 03/31/2022, now moderate.  2. Moderate to large knee joint effusion.    MRI Lower Extremity Left 05/21/22  FINDINGS:         Menisci: Complex tear of posterior horn and posterior body of the medial meniscus extending to the femoral articular surface. Additional free edge radial tearing of the posterior horn. Mild mucoid degeneration of the lateral meniscus. Ligaments: The ACL, PCL, MCL, and LCL complex are intact. The medial and lateral patellar retinacula are intact.      Tendons: The extensor mechanism and popliteus tendon are intact.      Cartilage/Bone: The bone marrow signal intensity is within normal limits. No fracture line or bone marrow edema. No focal osseous lesion is seen.      -Patellofemoral: Diffuse cartilage thinning of the patellar ridge and trochlea. Full-thickness cartilage thinning of medial and lateral trochlear facets with subchondral edema and cystic change.      -Medial tibiofemoral: Full-thickness cartilage loss of the weightbearing medial femoral condyle with associated marrow edema and cortical irregularity. Diffuse cartilage thinning and areas of cartilage loss of the medial tibial plateau with subchondral marrow edema anteriorly.      -Lateral tibiofemoral: The femoral and tibial cartilage is diffusely  thinned but without focal defect.          Miscellaneous: Moderate joint effusion. Mild synovitis along Hoffa's fat-pad. Trace popliteal cyst with small volume fluid along the distal semimembranosus. Distal semimembranosus tendinosis.   IMPRESSION  1. Tricompartmental chondrosis which is most prominent in the medial compartment with areas of full thickness cartilage loss. Patellofemoral compartment also demonstrates full-thickness thinning of the medial and lateral trochlea.   2. Complex tear of the posterior horn and posterior body of the medial meniscus. Additional free edge radial tearing of posterior horn.   3. Moderate joint effusion without evidence of loose bodies. Trace fluid in a popliteal cyst.     XR Knee Left 03/31/22  FINDINGS:   There is normal mineralization and alignment. No acute fracture or dislocation is identified. The joint spaces appear maintained. There is a large suprapatellar joint effusion. The soft tissues appear unremarkable.   IMPRESSION  Nonspecific large suprapatellar joint effusion.     MRI Lumbar Spine 03/25/22  FINDINGS:   Degenerative bone marrow changes at L5-S1. Bone marrow signal intensity is otherwise normal in appearance. The visualized cord is unremarkable and the conus medullaris ends at a normal level.      Mild multilevel listhesis likely degenerative, including grade 1 anterolisthesis of L4-5 and L5-S1. Multilevel disc desiccation with mild loss of disc height most prominent at L4-5 and L5-S1, with near complete loss of disc height at L5-S1 with severe degenerative endplate changes.      Varying degrees of spinal and neural foraminal stenosis, as follows:      T12-L1: No herniation. No spinal canal narrowing. No neural foraminal narrowing.      L1-L2: No herniation. No spinal canal narrowing. No neural foraminal narrowing.      L2-L3: Small disc bulge with mild canal narrowing. No neural foraminal narrowing.      L3-L4: Small disc bulge with ligamentum flavum hypertrophy results in mild and moderate canal narrowing No neural foraminal narrowing.      L4-L5: Disc bulge with an annular fissure results in moderate canal narrowing and likely effacement of the descending left L5 nerve root (4:186). The bulging disc and bilateral facet arthrosis leads to mild bilateral neural foraminal stenosis.      L5-S1: Posterior osteophyte superimposed on disc bulge leads to mild canal narrowing. No bony osteophyte and bilateral facet arthrosis leads to moderate right and mild left neural foraminal narrowing.      The paraspinal tissues are within normal limits.      For the purposes of this dictation, the lowest well formed intervertebral disc space is assumed to be the L5-S1 level, and there are presumed to be five lumbar-type vertebral bodies.   IMPRESSION  -- Multilevel lumbar spondylosis, most notably including near complete loss of disc height at L5-S1 with degenerative endplate changes.      -- Moderate canal narrowing at L4-L5 with displacement of the descending left L5 nerve root.       Lab Results   Component Value Date    CREATININE 0.49 (L) 06/26/2023       Lab Results   Component Value Date    ALKPHOS 89 05/08/2023    BILITOT 0.7 05/08/2023    BILIDIR 0.10 02/26/2023    PROT 7.4 05/08/2023    ALBUMIN 4.2 05/08/2023    ALT 23 05/08/2023    AST 25 05/08/2023       Lab Results   Component Value Date    PLT 214 06/26/2023  Urine toxiciology screen  Lab Results   Component Value Date    Amphetamines Screen, Ur Negative 05/20/2023    Barbiturates Screen, Ur Negative 05/20/2023    Benzodiazepines Screen, Urine Negative 05/20/2023    Cannabinoids Screen, Ur Negative 05/20/2023    Methadone Screen, Urine Negative 05/20/2023    Opiates Screen, Ur Negative 05/20/2023    Cocaine(Metab.)Screen, Urine Negative 05/20/2023       OPIOID CONFIRMATION:  Lab Results   Component Value Date    Norbuprenorphine <5 04/15/2023    Hydrocodone <25 04/15/2023    Hydromorphone <25 04/15/2023    Morphine <25 04/15/2023    Oxycodone <25 04/15/2023    Oxymorphone <25 04/15/2023    6-Monoacetylmorphine <5 04/15/2023    Interpretation NEGATIVE 04/15/2023        BENZODIAZEPINE CONFIRMATION:  No results found for: CDIFFTOX, OHALP, CLONU, HDXYFLRAZUR, 7NHFLU, DESALKYCONF, LORAZURQT, HDXYTRIAZUR, MIDAZURQT, BNZU    Review of Systems:  General {ajlrosgen:46920}  Cardiovascular {aggROSCV:37303}  Gastrointestinal {aggROSGI:37304}  Skin {aggROSskin:37305}  Endocrine {aggROSendo:37306}  Musculoskeletal {aggROSmusk:37307}  Neurologic {aggROSneu:37308}  Psychiatric {aggROSpsych:37309}        She denies any homicidal or suicidal ideation.     Objective:     PHYSICAL EXAM:  There were no vitals taken for this visit.  Wt Readings from Last 3 Encounters:   06/26/23 86.5 kg (190 lb 9.6 oz)   06/02/23 83.9 kg (185 lb)   05/20/23 88 kg (194 lb)     GENERAL:  The patient is well developed, well-nourished, and appears to be in no apparent distress.   HEAD/NECK:    Normocephalic/atraumatic. clear sclera, pupils not pinpoint  CV:  RR  LUNGS: Normal work of breathing, no supplemental O2  EXTREMITIES:  No clubbing, cyanosis noted.  NEUROLOGIC:    The patient is alert and oriented, speech fluent, normal language.   MUSCULOSKELETAL:    Motor function  preserved. Good range of motion of all extremities.   GAIT:  The patient rises from a seated position with no difficulty and ambulates with nonantalgic gait without the assistance of a walking aid.   SKIN:   No obvious rashes, lesions, or erythema.  PSY:   Appropriate affect. No overt pain behaviors. No evidence of psychomotor retardation or agitation, no signs of intoxication.

## 2023-06-27 NOTE — Unmapped (Signed)
Patient called stating that she has been in and out of the hospital due to pain. She is calling to request oxycodone. She had a visit with her Pain Clinic provider today, but she is upset that she was prescribed baclofen that she states does not work. She is requesting a return call. Routed to provider.

## 2023-06-27 NOTE — Unmapped (Signed)
[facility] SUBJECT FOR NEW MSG: Patient Advice Request Patient Name: Tonya Wood  Caller: Self (Patient)  Name of Caller: Warnell Forester Method: Telephone Call: Time- Any Time [phone]   Reason for Call: Wanted to speak to Dr. Loleta Chance about what pain management told her today.   Previously Discussed: yes  Appointment Offered: Declined  If offered accepted, scheduled appt date: Patient declined

## 2023-06-27 NOTE — Unmapped (Addendum)
It was good to see you today.    Stop flexeril.     We will prescribe Baclofen 10mg  tablets, which is a muscle relaxant.  It can sometimes be sedating, so do not drive or do anything with responsibility the first few times you take it until you know that you tolerate it.  You can use it up to three times a day as needed (less on good days, up to the max on worse days).      We referred you to pain psychology    We will see you in 2 months, or sooner if needed.

## 2023-06-30 MED FILL — NICOTINE (POLACRILEX) 4 MG GUM: ORAL | 5 days supply | Qty: 110 | Fill #3

## 2023-07-01 ENCOUNTER — Ambulatory Visit: Admit: 2023-07-01 | Discharge: 2023-07-01 | Payer: PRIVATE HEALTH INSURANCE

## 2023-07-01 MED ADMIN — gadopiclenol injection 7.5 mL: 7.5 mL | INTRAVENOUS | @ 13:00:00 | Stop: 2023-07-01

## 2023-07-01 NOTE — Unmapped (Signed)
[facility]  Care Management Progress Note               Purpose of contact: SW followed up on patient request to assist with medication and PCP appointment.    Patient called SW. She has not heard from Dr. Loleta Chance in response to patient's phone call requesting oxycodone. Patient said what the pain clinic prescribed (baclofen) is not working and she would like to see if Dr. Loleta Chance can prescribe oxycodone. SW explained SW does not know if Dr. Loleta Chance will prescribe oxycodone, but she can come in and discuss mediation options. PCP appointment scheduled for 10/16.    Patient asked about PCS (in-home ADL support through Medicaid). SW faxed referral on 8/29 and explained patient should call the number on the back of her medicaid card to follow up and schedule an intake.     Patient has SW's contact information and SW encouraged patient to call SW with any questions.    Shea Stakes, CCM  [facility] Manager  [facility]  [phone]

## 2023-07-01 NOTE — Unmapped (Addendum)
Pain is calling back to speak to Dr. Loleta Chance in regard to pain management. Patient and hospital states that the matter is urgent and is requesting to speak  to Dr. Loleta Chance and they have tried to contact her. Patient states that the pain level has increased. The call back number to patients cell is [phone].

## 2023-07-01 NOTE — Unmapped (Addendum)
Per Yolande Jolly, we do not prescribe oxycodone for MS pain. Offer her a sooner appointment with Dr. Thedore Mins if desired. Called patient. LVM, HIPAA compliant.    Patient called. Returned call. Patient states that she was updating [place] per her Pain Clinic, and she does not need any assistance from [place]. Confirm with patient appointment with Yolande Jolly on 11/6.

## 2023-07-02 ENCOUNTER — Ambulatory Visit: Admit: 2023-07-02 | Discharge: 2023-07-03 | Payer: PRIVATE HEALTH INSURANCE

## 2023-07-02 DIAGNOSIS — R4588 Nonsuicidal self-injury (CMS-HCC): Principal | ICD-10-CM

## 2023-07-02 DIAGNOSIS — E1165 Type 2 diabetes mellitus with hyperglycemia: Principal | ICD-10-CM

## 2023-07-02 LAB — CBC W/ AUTO DIFF
BASOPHILS ABSOLUTE COUNT: 0 10*9/L (ref 0.0–0.1)
BASOPHILS RELATIVE PERCENT: 0.4 %
EOSINOPHILS ABSOLUTE COUNT: 0.1 10*9/L (ref 0.0–0.5)
EOSINOPHILS RELATIVE PERCENT: 2.2 %
HEMATOCRIT: 47.8 % — ABNORMAL HIGH (ref 34.0–44.0)
HEMOGLOBIN: 16.2 g/dL — ABNORMAL HIGH (ref 11.3–14.9)
LYMPHOCYTES ABSOLUTE COUNT: 1.2 10*9/L (ref 1.1–3.6)
LYMPHOCYTES RELATIVE PERCENT: 27.2 %
MEAN CORPUSCULAR HEMOGLOBIN CONC: 33.9 g/dL (ref 32.0–36.0)
MEAN CORPUSCULAR HEMOGLOBIN: 30 pg (ref 25.9–32.4)
MEAN CORPUSCULAR VOLUME: 88.5 fL (ref 77.6–95.7)
MEAN PLATELET VOLUME: 8.7 fL (ref 6.8–10.7)
MONOCYTES ABSOLUTE COUNT: 0.4 10*9/L (ref 0.3–0.8)
MONOCYTES RELATIVE PERCENT: 8.4 %
NEUTROPHILS ABSOLUTE COUNT: 2.8 10*9/L (ref 1.8–7.8)
NEUTROPHILS RELATIVE PERCENT: 61.8 %
PLATELET COUNT: 236 10*9/L (ref 150–450)
RED BLOOD CELL COUNT: 5.4 10*12/L — ABNORMAL HIGH (ref 3.95–5.13)
RED CELL DISTRIBUTION WIDTH: 14.2 % (ref 12.2–15.2)
WBC ADJUSTED: 4.5 10*9/L (ref 3.6–11.2)

## 2023-07-02 LAB — COMPREHENSIVE METABOLIC PANEL
ALBUMIN: 4.6 g/dL (ref 3.4–5.0)
ALKALINE PHOSPHATASE: 95 U/L (ref 46–116)
ALT (SGPT): 17 U/L (ref 10–49)
ANION GAP: 4 mmol/L — ABNORMAL LOW (ref 5–14)
AST (SGOT): 20 U/L (ref <=34)
BILIRUBIN TOTAL: 0.7 mg/dL (ref 0.3–1.2)
BLOOD UREA NITROGEN: 11 mg/dL (ref 9–23)
BUN / CREAT RATIO: 19
CALCIUM: 10.5 mg/dL — ABNORMAL HIGH (ref 8.7–10.4)
CHLORIDE: 103 mmol/L (ref 98–107)
CO2: 32 mmol/L — ABNORMAL HIGH (ref 20.0–31.0)
CREATININE: 0.59 mg/dL
EGFR CKD-EPI (2021) FEMALE: >90 mL/min/{1.73_m2} (ref >=60)
GLUCOSE RANDOM: 96 mg/dL (ref 70–179)
POTASSIUM: 4.2 mmol/L (ref 3.4–4.8)
PROTEIN TOTAL: 8 g/dL (ref 5.7–8.2)
SODIUM: 139 mmol/L (ref 135–145)

## 2023-07-02 LAB — HEMOGLOBIN A1C
ESTIMATED AVERAGE GLUCOSE: 131 mg/dL
HEMOGLOBIN A1C: 6.2 % — ABNORMAL HIGH (ref 4.8–5.6)

## 2023-07-02 LAB — PRO-BNP: PRO-BNP: <35 pg/mL (ref <=300.0)

## 2023-07-02 MED ORDER — FUROSEMIDE 20 MG TABLET
ORAL_TABLET | Freq: Every day | ORAL | 0 refills | 5 days | Status: CP
Start: 2023-07-02 — End: 2023-07-07
  Filled 2023-07-02: qty 5, 5d supply, fill #0

## 2023-07-02 MED ORDER — FREESTYLE LIBRE 3 SENSOR DEVICE
3 refills | 84 days | Status: CP
Start: 2023-07-02 — End: ?
  Filled 2023-07-03: qty 4, 56d supply, fill #0

## 2023-07-02 NOTE — Unmapped (Signed)
The patient came to the clinic and requested refills for a blood glucose sensor. The prescription was sent to the pharmacy.

## 2023-07-02 NOTE — Unmapped (Signed)
I was immediately available via phone/pager or present on site.  I reviewed the case but did not see the patient.  I agree with the assessment and plan as documented in the resident's note. Lurena Joiner, MD

## 2023-07-02 NOTE — Unmapped (Signed)
Managed by pain clinic, I provide oxycodone. Pt reports she is already out of her oxycodone though she is not due for typical monthly refill until 11/4 and had additional oxy provided by ED since then for acute pain. Counseled on appropriate use of oxycodone, pt is aware. Refill of oxy sent for 11/4. Follow up 3 months.    Orders:    oxyCODONE (ROXICODONE) 5 MG immediate release tablet; Take 1 tablet (5 mg total) by mouth two (2) times a day as needed for pain.

## 2023-07-02 NOTE — Unmapped (Signed)
Hx COPD (FEV1 >80% on PFTs 04/2022 but has not had PFT since) with recent exacerbation, completed abx therapy. MMRC score of 4 in 04/2023 and pt reports worsening since then, cannot even dress self or walk to bathroom without dyspnea. Has been on triple therapy with Trelegy Ellipta 100-62.5-25 mcg/actuation 1 puff daily since 04/2023, using albuterol inhaler every 4-6 hours. Actively coughing throughout exam today. POCUS lung exam with >3 B lines in multiple lung fields in left lower lobes, none on right.  POCUS cardiac exam with good squeeze, no pericardial effusion. Unable to assess IVC as pt cannot lie flat. No LE edema on exam. Suspect pulm edema vs worsening of existing COPD though no c/f acute exacerbation at this time vs new HF less likely vs low concern for PE though pt does have RF. Sat >97% ORA at rest and with ambulation in the room. Will send for lasix 20 mg PO (pt is lasix naive) x5 days, ED/return precautions reviewed. Sending for repeat PFTs and formal echo, CXR and labs today on way out.     Orders:    Hemoglobin A1c; Future    Pro-BNP; Future    Lactate, Venous, Whole Blood; Future    CBC w/ Differential; Future    Comprehensive Metabolic Panel; Future    Echocardiogram W Colorflow Spectral Doppler; Future    Spirometry Pre/Post Bronchodilator; Future    Diffusion Studies; Future    XR Chest 2 views; Future    Hemoglobin A1c    Pro-BNP    Lactate, Venous, Whole Blood    CBC w/ Differential    Comprehensive Metabolic Panel

## 2023-07-02 NOTE — Unmapped (Addendum)
[facility] Pulmonary Function [facility] (breathing tests)  [address]  [address]  [place], [place] [address]  Get Driving Directions  Main Phone: [phone]    Tonya Wood may call the [facility] center at [phone] to schedule your appointment for your echocardiogram (ultrasound of the heart).    I have sent you 5 days of furosemide (Lasix) to take for your cough. Take 1 pill daily for 5 days. This will make you pee a lot.    I will refill your oxycodone for 07/21/23.

## 2023-07-02 NOTE — Unmapped (Signed)
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 coming in for the following issues:    Assessment & Plan  Chronic obstructive pulmonary disease, unspecified COPD type (CMS-HCC)  Hx COPD (FEV1 >80% on PFTs 04/2022 but has not had PFT since) with recent exacerbation, completed abx therapy. MMRC score of 4 in 04/2023 and pt reports worsening since then, cannot even dress self or walk to bathroom without dyspnea. Has been on triple therapy with Trelegy Ellipta 100-62.5-25 mcg/actuation 1 puff daily since 04/2023, using albuterol inhaler every 4-6 hours. Actively coughing throughout exam today. POCUS lung exam with >3 B lines in multiple lung fields in left lower lobes, none on right.  POCUS cardiac exam with good squeeze, no pericardial effusion. Unable to assess IVC as pt cannot lie flat. No LE edema on exam. Suspect pulm edema vs worsening of existing COPD though no c/f acute exacerbation at this time vs new HF less likely vs low concern for PE though pt does have RF. Sat >97% ORA at rest and with ambulation in the room. Will send for lasix 20 mg PO (pt is lasix naive) x5 days, ED/return precautions reviewed. Sending for repeat PFTs and formal echo, CXR and labs today on way out.     Orders: Hemoglobin A1c; Future    Pro-BNP; Future    Lactate, Venous, Whole Blood; Future    CBC w/ Differential; Future    Comprehensive Metabolic Panel; Future    Echocardiogram W Colorflow Spectral Doppler; Future    Spirometry Pre/Post Bronchodilator; Future    Diffusion Studies; Future    XR Chest 2 views; Future    Hemoglobin A1c    Pro-BNP    Lactate, Venous, Whole Blood    CBC w/ Differential    Comprehensive Metabolic Panel    Cough, unspecified type  See COPD problem. B lines seen on BSUS, sending for labs and CXR today. Can consider sending add'l Prednisone if no improvement with lasix. Counseled on smoking cessation.    Orders:    furosemide (LASIX) 20 MG tablet; Take 1 tablet (20 mg total) by mouth daily for 5 days.    Sedimentation rate, manual; Future    C-reactive protein; Future    Chronic pain syndrome  Managed by pain clinic, I provide oxycodone. Pt reports she is already out of her oxycodone though she is not due for typical monthly refill until 11/4 and had additional oxy provided by ED since then for acute pain. Counseled on appropriate use of oxycodone, pt is aware. Refill of oxy sent for 11/4. Follow up 3 months.    Orders:    oxyCODONE (ROXICODONE) 5 MG immediate release tablet; Take 1 tablet (5 mg total) by mouth two (2) times a day as needed for pain.    Nonsuicidal self-injury (CMS-HCC)  Mental health has been bad due to pain, pt has started inflicting self-injury (cutting on legs etc) to distract herself. No active suicidal ideation, does have passive SI. Not in therapy, counseled again  today. Pt's primary caregiver is her mother. I do not have concern for her safety/suicide risk but I do have concerns that she will continue to self-injure. Most of thoughts stem from dental pain at the moment, and fortunately pt has scheduled appt to remove all teeth on 11/27. Will plan to follow up after that. Will send message to [facility] for check in.            Luther Hearing MD MPH  Family Medicine PGY3    Attending: Dr. Melburn Hake    Subjective   Tonya Wood is a 57 y.o. female coming to clinic today for the following issues:    Chief Complaint   Patient presents with    Follow-up     Medication follow up and vaccines        HPI:     # Pain  Mom is only one helping her, mom is not able to help her well, wants her to be put in a home  They called her today from medicaid to assess her for an aid  Taking all teeth out 11/27    PLAN from pain control:   - Referred to pain psychology   - Start baclofen 10 mg TID as tolerated, titration instruction provided.  - Continue follow up with neurology ,PCP, ortho  - Encouraged patient go home and rest and reach out to her PCP regarding new onset cough      # Cough  Not yellow/green like it was at the hospital  Finished antibiotics  Cough is all the time, nonstop  Wakes her up at night  Can't lie flat because can't breathe/cough a lot  Smoking 2 cigarettes per day    # Mental health  Started cutting herself again NSSI          I have reviewed the problem list, medications, and allergies and have updated/reconciled them if needed.    Tonya Wood  reports that she has been smoking cigarettes. She started smoking about 37 years ago. She has a 75.4 pack-year smoking history. She has never used smokeless tobacco.  Health Maintenance   Topic Date Due    Retinal Eye Exam  04/24/2021    Colon Cancer Screening  05/22/2023    Hemoglobin A1c  07/03/2023    Zoster Vaccines (2 of 2) 08/27/2023    Lung Cancer Screening Shared Decision Making  12/03/2023    Foot Exam  01/01/2024    Urine Albumin/Creatinine Ratio  01/01/2024    Serum Creatinine Monitoring  06/25/2024    Potassium Monitoring  06/25/2024    Mammogram Start Age 23  11/19/2024    HPV Cotest with Pap Smear (21-65)  01/16/2026    Pap Smear with Cotest HPV (21-65)  01/16/2026    COPD Spirometry  05/09/2027    Lipid Screening  09/26/2027    DTaP/Tdap/Td Vaccines (3 - Td or Tdap) 05/04/2029    Pneumococcal Vaccine 0-64  Completed Hepatitis C Screen  Completed    COVID-19 Vaccine  Completed    Influenza Vaccine  Completed       Objective     VITALS: BP 122/89  - Pulse 77  - Temp 36.8 ??C (98.3 ??F) (Temporal)  - Ht 170.2 cm (5' 7)  - Wt 80.7 kg (178 lb)  - BMI 27.88 kg/m??     Physical Exam  GEN: uncomfortable but nontoxic appearing, appears stated age, NAD   HEENT: NCAT, No scleral icterus. MMM. EOMI.  CV: Clinically well perfused. RRR, no  MRG.  Pulm: Somewhat dyspneic on room air, poor effort, unable to take deep breaths but does speak in full sentences. Lungs CTAB. No wheezes, rales, ronchi. O2 Sat 99% at rest and >97% with ambulation.   Abd: Flat, nontender, nondistended.  Neuro: A&O x 3. No focal deficits.  Ext: Warm. Moves all extremities spontaneously. No LE edema bilaterally..  Skin: Evidence of self-inflicted cuts on legs in various stages of healing.  Psych: Anxious affect but normal thought content. Endorses thoughts of suicide and self-harm but no active SI and no plan.      LABS/IMAGING  I have reviewed pertinent recent labs and imaging in Epic    [facility]  [place] [facility] [place] [facility]  [address], [place], [place] [address]  Telephone [phone]  Fax [phone]  [email or web address]

## 2023-07-02 NOTE — Unmapped (Signed)
Attempted to call patient. No response. Left vm to call us back. If pain is increasing provider wanted pt to go to ED. She will try contact pt with in the next few days.

## 2023-07-03 LAB — C-REACTIVE PROTEIN: C-REACTIVE PROTEIN: <4 mg/L (ref <=10.0)

## 2023-07-04 ENCOUNTER — Ambulatory Visit
Admit: 2023-07-04 | Discharge: 2023-07-05 | Payer: PRIVATE HEALTH INSURANCE | Attending: Student in an Organized Health Care Education/Training Program | Primary: Student in an Organized Health Care Education/Training Program

## 2023-07-04 DIAGNOSIS — R0602 Shortness of breath: Principal | ICD-10-CM

## 2023-07-04 DIAGNOSIS — R059 Cough, unspecified type: Principal | ICD-10-CM

## 2023-07-04 NOTE — Unmapped (Signed)
Nurse spoke with pt over the phone. Pt is c/o worsening cough. Pt reports not being able to sleep due to the cough.Nurse advised pt to report to the nearest urgent care for an evaluation. Pt agrees and states she will go to [facility] urgent care.

## 2023-07-04 NOTE — Unmapped (Signed)
ASSESSMENT/PLAN:     Pt with shortness of breath and cough x1 month. Pt has taken antibiotics and lasix over the past week with no relief. Pt has negative CXR and normal BNP. Pt has appointment with Pulmonology in 3 weeks. Pt sent to the ED for further evaluation and management.      Diagnosis ICD-10-CM Associated Orders   1. Shortness of breath  R06.02       2. Cough, unspecified type  R05.9         Requested Prescriptions      No prescriptions requested or ordered in this encounter     Instructions about new medications and side effects provided.  If any changes in chronic medications then new reconciled medication list is given to patient.     CHIEF COMPLAINT:     Chief Complaint   Patient presents with    Cough     Over a month - Lasix given by PCP on Monday, cough not improving.        SUBJECTIVE/HPI:   57 y.o. Female presents with cough and shortness of breath x1 month. Pt has been prescribed Lasix and antibiotics over the past week without relief. Pt states her symptoms have worsened ov er the past few days. Pt denies fever.    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/28/2021    Odontogenic infection of jaw 12/25/2018    Severe cocaine use disorder (CMS-HCC) 05/07/2019    Smoker 12/27/2019    Unsteady gait 06/02/2015     Allergies   Allergen Reactions    Grass Pollen-[place], Standard     Statins-Hmg-Coa Reductase Inhibitors      Hold statin due to elevated CK     Social History     Socioeconomic History    Marital status: Widowed Spouse name: None    Number of children: None    Years of education: None    Highest education level: None   Tobacco Use    Smoking status: Every Day     Current packs/day: 2.00     Average packs/day: 2.0 packs/day for 37.7 years (75.4 ttl pk-yrs)     Types: Cigarettes     Start date: 10/17/1985    Smokeless tobacco: Never   Vaping Use    Vaping status: Never Used   Substance and Sexual Activity    Alcohol use: No    Drug use: Never    Sexual activity: Not Currently   Social History Narrative    Taking care of grandchildren in [place]         PSYCHIATRIC HX:     -Current provider(s):  No psychiatric but resources for [employer] given     -Suicide attempts/SIB: Attempts: YES, per chart review 1998 jumped off bridge    -Psych Hospitalizations:  YES, multiple    -Med compliance hx: has not been on psych meds in a long while per patient    -Fa hx suicide: not asked  SUBSTANCE ABUSE HX:     -Current using substance: NO    -Hx w/d sxs: not asked    -Sz Hx: not asked    -DT Hx:not asked        SOCIAL HX:    -Current living environment: Lives with family    -Current support(s): children and parent/s    -Violence (perp): NO    -Access to Firearms: NO     Family History   Problem Relation Age of Onset    Asthma Mother     Cancer Son     Cancer Other     Breast cancer Neg Hx        ROS:   Review of Systems   Respiratory:  Positive for cough and shortness of breath.      See Subjective/HPI  Medications, Allergies and Problem List personally reviewed in Epic today    OBJECTIVE:          07/04/23 1606   BP: 104/75   Pulse: 108   Resp: 18   Temp: 36.3 ??C (97.3 ??F)   Weight: 85.4 kg (188 lb 3.2 oz)   Height: 170.2 cm (5' 7)     Physical Exam  Vitals reviewed.   Constitutional:       General: She is not in acute distress.     Appearance: Normal appearance.   HENT:      Head: Normocephalic.      Right Ear: Tympanic membrane, ear canal and external ear normal.      Left Ear: Tympanic membrane, ear canal and external ear normal.      Nose: Nose normal.      Mouth/Throat:      Pharynx: Oropharynx is clear.   Eyes:      Conjunctiva/sclera: Conjunctivae normal.   Cardiovascular:      Rate and Rhythm: Normal rate and regular rhythm.      Heart sounds: Normal heart sounds.   Pulmonary:      Effort: Pulmonary effort is normal. No respiratory distress.      Breath sounds: Normal breath sounds.   Skin:     General: Skin is warm and dry.   Neurological:      Mental Status: She is alert.          No results found.     LABS/X-RAYS/EKG/MEDS:       No results found for this visit on 07/04/23.    No results found.      I provided an intervention for the Tobacco Use SDOH domain. The intervention was Counseling and Education     Transferred to ED Patient Disposition    Indication for Disposition: Requires a higher level of care    Chief Complaint: shortness of breath      ALLURE IGOE given instructions to proceed to ED via personal vehicle; Location: [place] due to Other      Report called to: charge nurse           A copy of these instructions have been given to the patient or responsible adult who demonstrated the ability to learn, asked appropriate questions, and verbalized understanding of the plan of care.  There were no barriers to learning identified.    Please take the time to sign up for a MyChart Account. See sign up information in your After Visit Summary. You will be able to view lab results, make appointments, communicate with providers, and much more.

## 2023-07-04 NOTE — Unmapped (Signed)
[facility] SUBJECT FOR NEW MSG: Patient Advice Request Patient Name: Tonya Wood  Caller: Self (Patient)  Name of Caller: Tonya Wood  Contact Method: Telephone Call: Time- Any Time [phone]   Reason for Call: Patient called saying she came in 07/02/23 for a cough, she was given medication for cough, but she feels it has become worse. Wants to speak to a nurse for further advice. Patient was given the option to come in but she declined because she has no transportation.  Previously Discussed: yes  Appointment Offered: Declined  If offered accepted, scheduled appt date: n/a

## 2023-07-06 ENCOUNTER — Emergency Department
Admit: 2023-07-06 | Discharge: 2023-07-07 | Disposition: A | Discharge status: Home / Self Care | Payer: PRIVATE HEALTH INSURANCE | Attending: Student in an Organized Health Care Education/Training Program

## 2023-07-06 ENCOUNTER — Ambulatory Visit
Admit: 2023-07-06 | Discharge: 2023-07-07 | Disposition: A | Discharge status: Home / Self Care | Payer: PRIVATE HEALTH INSURANCE | Attending: Student in an Organized Health Care Education/Training Program

## 2023-07-06 LAB — TOXICOLOGY SCREEN, URINE
AMPHETAMINE SCREEN URINE: NEGATIVE
BARBITURATE SCREEN URINE: NEGATIVE
BENZODIAZEPINE SCREEN, URINE: NEGATIVE
BUPRENORPHINE, URINE SCREEN: NEGATIVE
CANNABINOID SCREEN URINE: NEGATIVE
COCAINE(METAB.)SCREEN, URINE: NEGATIVE
FENTANYL SCREEN, URINE: NEGATIVE
METHADONE SCREEN, URINE: NEGATIVE
OPIATE SCREEN URINE: NEGATIVE
OXYCODONE SCREEN URINE: POSITIVE — AB

## 2023-07-06 LAB — COMPREHENSIVE METABOLIC PANEL
ALBUMIN: 4.2 g/dL (ref 3.4–5.0)
ALKALINE PHOSPHATASE: 92 U/L (ref 46–116)
ALT (SGPT): 11 U/L (ref 10–49)
ANION GAP: 8 mmol/L (ref 5–14)
AST (SGOT): 16 U/L (ref <=34)
BILIRUBIN TOTAL: 0.9 mg/dL (ref 0.3–1.2)
BLOOD UREA NITROGEN: 14 mg/dL (ref 9–23)
BUN / CREAT RATIO: 23
CALCIUM: 10.4 mg/dL (ref 8.7–10.4)
CHLORIDE: 105 mmol/L (ref 98–107)
CO2: 26.1 mmol/L (ref 20.0–31.0)
CREATININE: 0.62 mg/dL
EGFR CKD-EPI (2021) FEMALE: >90 mL/min/{1.73_m2} (ref >=60)
GLUCOSE RANDOM: 132 mg/dL (ref 70–179)
POTASSIUM: 3.7 mmol/L (ref 3.4–4.8)
PROTEIN TOTAL: 7.4 g/dL (ref 5.7–8.2)
SODIUM: 139 mmol/L (ref 135–145)

## 2023-07-06 LAB — CBC W/ AUTO DIFF
BASOPHILS ABSOLUTE COUNT: 0 10*9/L (ref 0.0–0.1)
BASOPHILS RELATIVE PERCENT: 0.4 %
EOSINOPHILS ABSOLUTE COUNT: 0 10*9/L (ref 0.0–0.5)
EOSINOPHILS RELATIVE PERCENT: 1.1 %
HEMATOCRIT: 46 % — ABNORMAL HIGH (ref 34.0–44.0)
HEMOGLOBIN: 15.6 g/dL — ABNORMAL HIGH (ref 11.3–14.9)
LYMPHOCYTES ABSOLUTE COUNT: 1.1 10*9/L (ref 1.1–3.6)
LYMPHOCYTES RELATIVE PERCENT: 27.3 %
MEAN CORPUSCULAR HEMOGLOBIN CONC: 34 g/dL (ref 32.0–36.0)
MEAN CORPUSCULAR HEMOGLOBIN: 29.7 pg (ref 25.9–32.4)
MEAN CORPUSCULAR VOLUME: 87.5 fL (ref 77.6–95.7)
MEAN PLATELET VOLUME: 8.2 fL (ref 6.8–10.7)
MONOCYTES ABSOLUTE COUNT: 0.4 10*9/L (ref 0.3–0.8)
MONOCYTES RELATIVE PERCENT: 10 %
NEUTROPHILS ABSOLUTE COUNT: 2.4 10*9/L (ref 1.8–7.8)
NEUTROPHILS RELATIVE PERCENT: 61.2 %
NUCLEATED RED BLOOD CELLS: 0 /100{WBCs} (ref <=4)
PLATELET COUNT: 225 10*9/L (ref 150–450)
RED BLOOD CELL COUNT: 5.25 10*12/L — ABNORMAL HIGH (ref 3.95–5.13)
RED CELL DISTRIBUTION WIDTH: 13.8 % (ref 12.2–15.2)
WBC ADJUSTED: 3.9 10*9/L (ref 3.6–11.2)

## 2023-07-06 LAB — PREGNANCY, URINE: PREGNANCY TEST URINE: NEGATIVE

## 2023-07-06 LAB — ETHANOL: ETHANOL: <10 mg/dL (ref <=10)

## 2023-07-06 LAB — TSH: THYROID STIMULATING HORMONE: 1.45 u[IU]/mL (ref 0.550–4.780)

## 2023-07-06 MED ADMIN — acetaminophen (TYLENOL) tablet 650 mg: 650 mg | ORAL | @ 20:00:00 | Stop: 2023-07-06

## 2023-07-06 MED ADMIN — lidocaine (ASPERCREME) 4 % 1 patch: 1 | TRANSDERMAL | @ 20:00:00

## 2023-07-06 MED ADMIN — oxyCODONE (ROXICODONE) immediate release tablet 5 mg: 5 mg | ORAL | @ 19:00:00 | Stop: 2023-07-06

## 2023-07-06 MED ADMIN — oxyCODONE (ROXICODONE) immediate release tablet 5 mg: 5 mg | ORAL | @ 23:00:00 | Stop: 2023-07-06

## 2023-07-06 MED ADMIN — prochlorperazine (COMPAZINE) tablet 10 mg: 10 mg | ORAL | @ 22:00:00 | Stop: 2023-07-06

## 2023-07-06 NOTE — Unmapped (Signed)
This patients food tray arrived to her room

## 2023-07-06 NOTE — Unmapped (Signed)
[facility]  Emergency Department Provider Note      ED Clinical Impression      Final diagnoses:   Suicidal ideation (Primary)   Other chronic pain            Impression, Medical Decision Making, Progress Notes and Critical Care      Impression, Differential Diagnosis and Plan of Care    Tonya Wood is a 57 y.o. female PMH COPD, diabetes mellitus, hypertension, hyponatremia, MDD, cocaine use disorder presenting with suicidal ideation in the setting of chronic pain.      On exam patient is tearful but in no acute distress, mildly hypertensive 136/100, afebrile, no tachycardia.  Will plan to obtain labs per psych medical clearance protocol.  Will touch base with psych emergency services as well.    Labs and imaging as reviewed below, largely unremarkable medically cleared at this time for psych.  Pending approval of IVC at this time for transfer to main for psychiatric evaluation.    7:11 PM  Patient case signed out to attending, Dr. Illene Labrador, pending transfer to main for PES eval.    Additional Progress Notes  ED Course as of 07/06/23 2011   Sun Jul 06, 2023   1534 CBC without leukocytosis, hemoglobin stable, ethanol negative   1728 CMP unremarkable, ethanol negative, TSH within normal limits, COVID-negative, chest x-ray negative   1737 Patient medically cleared at this time         Portions of this record have been created using [employer]. Dictation errors have been sought, but may not have been identified and corrected.    See chart and resident provider documentation for details.    ____________________________________________         History        Reason for Visit  Medical Problem      HPI   Tonya Wood is a 57 y.o. female PMH COPD, diabetes mellitus, hypertension, hyponatremia, MDD, cocaine use disorder presenting with suicidal ideation in the setting of chronic pain.  Patient notes ongoing right-sided neck pain has been present for the past few months but worsening over the past several days.  States that she does not get any relief of her pain.  Notes that pain is worse with palpation and movement of her arm.  Denies numbness or weakness in bilateral upper or lower extremities.  Also notes ongoing nausea every time that she eats to the point that she has not been eating as much.  Reports dry heaving but no frank emesis.  She states that due to these symptoms which have been ongoing for the past few months without resolve, she has been feeling suicidal.  Does note that she has cut herself a few times as well as burned herself in place that people cannot see.  She does note prior history of psychiatric hospitalizations.  Denies chest pain, shortness of breath, abdominal pain, dysuria, hematuria, any other symptoms or concerns at this time.    Per collateral, PCP  I spoke with the patient's PCP who also notes that she is very concerned for patient.  Notes that she has a high risk patient, has an extensive history of suicide attempts, regularly self harms by cutting and burns.  Frequently during her visits with PCP, she has noted that she felt suicidal but is requested that her PCP does not send her to the emergency department as she reassures that she will not act upon it.  She has had  psychiatric hospitalizations in the past.  PCP notes that patient does not have adequate support at home, her husband recently passed a few years ago and her son more recently passed along with another family member.    Outside Historian(s)  (EMS, Significant Other, Family, Parent, Caregiver, Friend, Patent examiner, etc.)      External Records Reviewed  (Inpatient/Outpatient notes, Prior labs/imaging studies, Care Everywhere, PDMP, External ED notes, etc)          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/28/2021    Odontogenic infection of jaw 12/25/2018    Severe cocaine use disorder (CMS-HCC) 05/07/2019    Smoker 12/27/2019    Unsteady gait 06/02/2015       Patient Active Problem List   Diagnosis    Adjustment disorder with mixed anxiety and depressed mood    Amaurosis fugax of right eye    Cocaine abuse with cocaine-induced mood disorder (CMS-HCC)    Colonic polyp    COPD (chronic obstructive pulmonary disease) (CMS-HCC)    Essential hypertension    Hallux rigidus of right foot    Hyperlipidemia    Hypothyroidism    Impaired cognition    MDD (major depressive disorder), recurrent episode, severe (CMS-HCC)    Obesity    Onychomycosis    Post traumatic stress disorder (PTSD)    Right sided weakness    Schizoaffective disorder, bipolar type (CMS-HCC)    Severe tobacco use disorder    Type 2 diabetes mellitus (CMS-HCC)    Healthcare maintenance    Knee pain    Allergic rhinitis    Ganglion cyst of dorsum of left wrist    Multiple sclerosis (CMS-HCC)    Chronic pain syndrome    Chronic right shoulder pain    Upper back pain, chronic    Toe injury, right, sequela    Weight loss of more than 10% body weight    Chronic, continuous use of opioids       Past Surgical History:   Procedure Laterality Date    CESAREAN SECTION      FINGER SURGERY Right     ring finger reattached    FOOT SURGERY Right          Current Facility-Administered Medications:     lidocaine (ASPERCREME) 4 % 1 patch, 1 patch, Transdermal, Once, Henreitta Leber, PA, 1 patch at 07/06/23 1621    Current Outpatient Medications:     acetaminophen (TYLENOL EXTRA STRENGTH) 500 MG tablet, Take 2 tablets (1,000 mg total) by mouth Three (3) times a day., Disp: 200 tablet, Rfl: 11    albuterol 2.5 mg /3 mL (0.083 %) nebulizer solution, Inhale 1 vial (2.5 mg total) by nebulization every four (4) hours., Disp: 540 mL, Rfl: 0    albuterol HFA 90 mcg/actuation inhaler, Inhale 2 puffs by mouth every six (6) hours as needed., Disp: 8.5 g, Rfl: 0    amantadine HCL (SYMMETREL) 100 mg capsule, Take 1 capsule (100 mg total)  by mouth two (2) times a day., Disp: 60 capsule, Rfl: 5    amlodipine-benazepril (LOTREL) 10-20 mg per capsule, Take 1 capsule by mouth daily., Disp: 30 capsule, Rfl: 5    aspirin (ECOTRIN) 81 MG tablet, Take 1 tablet (81 mg total) by mouth daily., Disp: 90 tablet, Rfl: 3    baclofen (LIORESAL) 10 MG tablet, Take 1 tablet (10 mg total) by mouth Three (3) times a day., Disp: 90 tablet, Rfl: 0    blood sugar diagnostic (GLUCOSE BLOOD) Strp, Test blood glucose 3 times daily., Disp: 50 strip, Rfl: 11    blood-glucose meter,continuous (FREESTYLE LIBRE 3 READER) Misc, Use as directed to test blood sugar, Disp: 1 each, Rfl: 0    blood-glucose sensor (FREESTYLE LIBRE 3 SENSOR) Devi, Use to monitor blood glucose levels continuously. Change sensor every 14 days., Disp: 6 each, Rfl: 3    cetirizine (ZYRTEC) 10 MG tablet, Take 1 tablet (10 mg total) by mouth daily., Disp: 90 tablet, Rfl: 3    chlorhexidine (PERIDEX) 0.12 % solution, Swish and spit out 15 mls two (2) times a day., Disp: 2365 mL, Rfl: 3    cholecalciferol, vitamin D3-1,250 mcg, 50,000 unit,, 1,250 mcg (50,000 unit) capsule, Take 1 capsule (1,250 mcg total) by mouth once a week., Disp: 24 capsule, Rfl: 0    colchicine (COLCRYS) 0.6 mg tablet, Take 1 tablet (0.6 mg total) by mouth daily., Disp: 90 tablet, Rfl: 3    diaper,brief,adult,disposable Misc, 1 each by Miscellaneous route daily as needed (incontinence)., Disp: 80 each, Rfl: 10    diclofenac sodium (VOLTAREN) 1 % gel, Apply 2 g topically four (4) times a day., Disp: 200 g, Rfl: 6    dimethyl fumarate 240 mg CpDR, Take 1 capsule (240 mg total) by mouth two (2) times a day., Disp: 180 capsule, Rfl: 1    divalproex ER (DEPAKOTE ER) 500 MG extended released 24 hr tablet, Take 1 tablet (500 mg total) by mouth daily., Disp: 30 tablet, Rfl: 1    dulaglutide (TRULICITY) 1.5 mg/0.5 mL PnIj, Inject the contents of 1 pen (1.5 mg total) under the skin every seven (7) days., Disp: 6 mL, Rfl: 3    DULoxetine (CYMBALTA) 60 MG capsule, Take 1 capsule (60 mg total) by mouth daily., Disp: 90 capsule, Rfl: 3    empagliflozin (JARDIANCE) 25 mg tablet, Take 1 tablet (25 mg total) by mouth daily., Disp: 90 tablet, Rfl: 3    ezetimibe (ZETIA) 10 mg tablet, Take 1 tablet (10 mg total) by mouth daily., Disp: 90 tablet, Rfl: 3    fluticasone propionate (FLONASE) 50 mcg/actuation nasal spray, Use 1 spray into each nostril daily., Disp: 16 g, Rfl: 11    fluticasone-umeclidin-vilanter (TRELEGY ELLIPTA) 100-62.5-25 mcg inhaler, Inhale 1 puff daily., Disp: 60 each, Rfl: 11    food supplemt, lactose-reduced Liqd, Take 1 supplement bottle (Ensure or Boost) three times daily when unable to eat solid food., Disp: 7110 mL, Rfl: 3    furosemide (LASIX) 20 MG tablet, Take 1 tablet (20 mg total) by mouth daily for 5 days., Disp: 5 tablet, Rfl: 0    ibuprofen (MOTRIN) 600 MG tablet, Take 1 tablet (600 mg total) by mouth every six (6) hours as needed for pain., Disp: 30 tablet, Rfl: 0    inhaler, assist devices (OPTICHAMBER, AEROCHAMBER, ADULT) Spcr, Use as directed with inhalers, Disp: 1 each, Rfl: 0    insulin glargine (LANTUS SOLOSTAR U-100 INSULIN) 100 unit/mL (3 mL) injection pen, Inject 0.2 mL (20  Units total) under the skin nightly., Disp: 15 mL, Rfl: 0    ipratropium-albuterol (DUO-NEB) 0.5-2.5 mg/3 mL nebulizer, Inhale 3 mL (contents of one nebule) by nebulization every six (6) hours as needed., Disp: 180 mL, Rfl: 3    lancets Misc, Test blood glucose 3 times daily., Disp: 100 each, Rfl: 11    levothyroxine (SYNTHROID) 100 MCG tablet, Take 1 tablet (100 mcg total) by mouth daily., Disp: 90 tablet, Rfl: 2    lidocaine-prilocaine (EMLA) 2.5-2.5 % cream, Apply topically Three (3) times a day as needed., Disp: 30 g, Rfl: 0    magnesium oxide (MAG-OX) 400 mg (241.3 mg elemental magnesium) tablet, Take 1 tablet (400 mg total) by mouth daily., Disp: 30 tablet, Rfl: 11    miscellaneous medical supply Misc, Bed pads for incontinence, Disp: 100 each, Rfl: 3    naloxone (NARCAN) 4 mg nasal spray, Give single spray in one nostril.  Repeat with 2nd device in other nostril every 3 min if no or minimal response until 911 arrives, Disp: 2 each, Rfl: 11    nicotine (NICODERM CQ) 21 mg/24 hr patch, Place 1 patch on the skin daily., Disp: 28 patch, Rfl: 2    nicotine polacrilex (NICORETTE) 4 MG gum, Chew 1 piece of gum (4 mg total) by mouth every hour as directed as needed for smoking cessation., Disp: 110 each, Rfl: 3    ondansetron (ZOFRAN) 4 MG tablet, Take 1 tablet (4 mg total) by mouth daily as needed for nausea., Disp: 30 tablet, Rfl: 1    [START ON 07/21/2023] oxyCODONE (ROXICODONE) 5 MG immediate release tablet, Take 1 tablet (5 mg total) by mouth two (2) times a day as needed for pain., Disp: 60 tablet, Rfl: 0    pen needle, diabetic (PEN NEEDLE) 31 gauge x 5/16 (8 mm) Ndle, Injection Frequency is 1 time per day, Disp: 100 each, Rfl: 11    polyethylene glycol (GLYCOLAX) 17 gram/dose powder, Mix 17g (measure to line in cap) as directed and drink mixture daily., Disp: 510 g, Rfl: 0    pregabalin (LYRICA) 75 MG capsule, Take 1 capsule (75 mg total) by mouth Three (3) times a day., Disp: 270 capsule, Rfl: 3    QUEtiapine (SEROQUEL) 200 MG tablet, Take 1 tablet (200 mg total) by mouth nightly., Disp: 90 tablet, Rfl: 3    varenicline (CHANTIX) 1 mg tablet, Take 1 tablet (1 mg total) by mouth daily., Disp: 30 tablet, Rfl: 3    Allergies  Grass pollen-orchardgrass, standard and Statins-hmg-coa reductase inhibitors    Family History   Problem Relation Age of Onset    Asthma Mother     Cancer Son     Cancer Other     Breast cancer Neg Hx        Social History  Social History     Tobacco Use    Smoking status: Every Day     Current packs/day: 2.00     Average packs/day: 2.0 packs/day for 37.7 years (75.4 ttl pk-yrs)     Types: Cigarettes     Start date: 10/17/1985    Smokeless tobacco: Never   Vaping Use    Vaping status: Never Used   Substance Use Topics    Alcohol use: No    Drug use: Never          Physical Exam       ED Triage Vitals [07/06/23 1353]   Enc Vitals Group      BP 136/100  Heart Rate 95      SpO2 Pulse       Resp 18      Temp 37.1 ??C (98.7 ??F)      Temp Source Oral      SpO2 100 %      Weight 85.3 kg (188 lb)      Height       Head Circumference       Peak Flow       Pain Score       Pain Loc       Pain Education       Exclude from Growth Chart        Constitutional: Alert and oriented. Well appearing and in no distress.  Eyes: Conjunctivae are normal.  ENT       Head: Normocephalic and atraumatic.       Nose: No congestion.       Mouth/Throat: Mucous membranes are moist.       Neck: No stridor.  Hematological/Lymphatic/Immunilogical: No cervical lymphadenopathy.  Cardiovascular: Normal rate, regular rhythm. Normal and symmetric distal pulses are present in all extremities.  Respiratory: Normal respiratory effort. Breath sounds are normal.  Gastrointestinal: Soft and nontender. There is no CVA tenderness.  No abdominal tenderness in all 4 quadrants.  Musculoskeletal: Tenderness to the right side of neck which reproduces patient's symptoms along with turning her head to the left and the right side.  Neurovascularly intact.       Right lower leg: No tenderness or edema.       Left lower leg: No tenderness or edema.  Neurologic: Normal speech and language. No gross focal neurologic deficits are appreciated.  Skin: Skin is warm, dry and intact. No rash noted.  Psychiatric: Mood and affect are normal. Speech and behavior are normal.       Radiology     XR Chest 2 views   Final Result   No radiographic evidence of acute cardiopulmonary pathology.                Labs     Results for orders placed or performed during the hospital encounter of 07/06/23   COVID-19 PCR    Specimen: Nasopharyngeal Swab   Result Value Ref Range    SARS-CoV-2 PCR Negative Negative   Ethanol   Result Value Ref Range    Alcohol, Ethyl <10 <=10 mg/dL   Comprehensive Metabolic Panel   Result Value Ref Range    Sodium 139 135 - 145 mmol/L    Potassium 3.7 3.4 - 4.8 mmol/L    Chloride 105 98 - 107 mmol/L    CO2 26.1 20.0 - 31.0 mmol/L    Anion Gap 8 5 - 14 mmol/L    BUN 14 9 - 23 mg/dL    Creatinine 1.61 0.96 - 1.02 mg/dL    BUN/Creatinine Ratio 23     eGFR CKD-EPI (2021) Female >90 >=60 mL/min/1.95m2    Glucose 132 70 - 179 mg/dL    Calcium 04.5 8.7 - 40.9 mg/dL    Albumin 4.2 3.4 - 5.0 g/dL    Total Protein 7.4 5.7 - 8.2 g/dL    Total Bilirubin 0.9 0.3 - 1.2 mg/dL    AST 16 <=81 U/L    ALT 11 10 - 49 U/L    Alkaline Phosphatase 92 46 - 116 U/L   Pregnancy Qualitative, Urine   Result Value Ref Range    Pregnancy Test, Urine Negative Negative   Toxicology Screen, Urine  Result Value Ref Range    Amphetamines Screen, Ur Negative <500 ng/mL    Barbiturates Screen, Ur Negative <200 ng/mL    Benzodiazepines Screen, Urine Negative <200 ng/mL    Cannabinoids Screen, Ur Negative <20 ng/mL    Methadone Screen, Urine Negative <300 ng/mL    Cocaine(Metab.)Screen, Urine Negative <150 ng/mL    Opiates Screen, Ur Negative <300 ng/mL    Fentanyl Screen, Ur Negative <1.0 ng/mL    Oxycodone Screen, Ur Positive (A) <100 ng/mL    Buprenorphine, Urine Negative <5 ng/mL   TSH   Result Value Ref Range    TSH 1.450 0.550 - 4.780 uIU/mL   CBC w/ Differential   Result Value Ref Range    WBC 3.9 3.6 - 11.2 10*9/L    RBC 5.25 (H) 3.95 - 5.13 10*12/L    HGB 15.6 (H) 11.3 - 14.9 g/dL    HCT 16.1 (H) 09.6 - 44.0 %    MCV 87.5 77.6 - 95.7 fL    MCH 29.7 25.9 - 32.4 pg    MCHC 34.0 32.0 - 36.0 g/dL    RDW 04.5 40.9 - 81.1 %    MPV 8.2 6.8 - 10.7 fL    Platelet 225 150 - 450 10*9/L    nRBC 0 <=4 /100 WBCs    Neutrophils % 61.2 %    Lymphocytes % 27.3 %    Monocytes % 10.0 %    Eosinophils % 1.1 %    Basophils % 0.4 %    Absolute Neutrophils 2.4 1.8 - 7.8 10*9/L    Absolute Lymphocytes 1.1 1.1 - 3.6 10*9/L    Absolute Monocytes 0.4 0.3 - 0.8 10*9/L    Absolute Eosinophils 0.0 0.0 - 0.5 10*9/L    Absolute Basophils 0.0 0.0 - 0.1 10*9/L       Pertinent labs & imaging results that were available during my care of the patient were reviewed by me and considered in my medical decision making (see chart for details).             Margaret Pyle [place], [place]  07/06/23 2011

## 2023-07-06 NOTE — Unmapped (Signed)
Presents persistent cough, right neck and shoulder pain and N/V after eating x 1 month. Hx of MS. Tearful during triage.

## 2023-07-06 NOTE — Unmapped (Signed)
This NA is sitting 1:1 with this patient. This patient has some clothes placed into a paper bag with this patients Name, MRN DOB this patient has a rollator with a bag of stuff on it.

## 2023-07-07 DIAGNOSIS — M2550 Pain in unspecified joint: Principal | ICD-10-CM

## 2023-07-07 DIAGNOSIS — M545 Chronic bilateral low back pain without sciatica: Principal | ICD-10-CM

## 2023-07-07 DIAGNOSIS — G8929 Other chronic pain: Principal | ICD-10-CM

## 2023-07-07 MED ORDER — IBUPROFEN 800 MG TABLET
ORAL_TABLET | ORAL | 0 refills | 20 days | Status: CP | PRN
Start: 2023-07-07 — End: ?
  Filled 2023-07-08: qty 60, 20d supply, fill #0

## 2023-07-07 MED ORDER — OXYCODONE 5 MG TABLET
ORAL_TABLET | Freq: Two times a day (BID) | ORAL | 0 refills | 5 days | Status: CP | PRN
Start: 2023-07-07 — End: ?
  Filled 2023-07-08: qty 10, 5d supply, fill #0

## 2023-07-07 MED ORDER — ACETAMINOPHEN 500 MG TABLET
ORAL_TABLET | Freq: Three times a day (TID) | ORAL | 3 refills | 90 days | Status: CP
Start: 2023-07-07 — End: 2024-07-06
  Filled 2023-07-11: qty 540, 90d supply, fill #0

## 2023-07-07 MED ADMIN — nicotine polacrilex (NICORETTE) gum 2 mg: 2 mg | BUCCAL | @ 06:00:00 | Stop: 2023-07-07

## 2023-07-07 MED ADMIN — aspirin chewable tablet 81 mg: 81 mg | ORAL | @ 15:00:00 | Stop: 2023-07-07

## 2023-07-07 MED ADMIN — fluticasone furoate-vilanterol (BREO ELLIPTA) 100-25 mcg/dose inhaler 1 puff: 1 | RESPIRATORY_TRACT | @ 15:00:00 | Stop: 2023-07-07

## 2023-07-07 MED ADMIN — insulin lispro (HumaLOG) injection 0-20 Units: 0-20 [IU] | SUBCUTANEOUS | @ 16:00:00 | Stop: 2023-07-07

## 2023-07-07 MED ADMIN — amlodipine-lisinopril 10-20mg COMBO product: ORAL | @ 15:00:00 | Stop: 2023-07-07

## 2023-07-07 MED ADMIN — oxyCODONE (ROXICODONE) immediate release tablet 5 mg: 5 mg | ORAL | @ 06:00:00 | Stop: 2023-07-07

## 2023-07-07 MED ADMIN — acetaminophen (TYLENOL) tablet 650 mg: 650 mg | ORAL | @ 06:00:00 | Stop: 2023-07-07

## 2023-07-07 MED ADMIN — levothyroxine (SYNTHROID) tablet 100 mcg: 100 ug | ORAL | @ 15:00:00 | Stop: 2023-07-07

## 2023-07-07 MED ADMIN — empagliflozin (JARDIANCE) tablet 25 mg: 25 mg | ORAL | @ 15:00:00 | Stop: 2023-07-07

## 2023-07-07 MED ADMIN — magnesium oxide (MAG-OX) tablet 400 mg: 400 mg | ORAL | @ 15:00:00 | Stop: 2023-07-07

## 2023-07-07 MED ADMIN — nicotine (NICODERM CQ) 14 mg/24 hr patch 1 patch: 1 | TRANSDERMAL | @ 06:00:00 | Stop: 2023-07-07

## 2023-07-07 MED ADMIN — oxyCODONE (ROXICODONE) immediate release tablet 5 mg: 5 mg | ORAL | @ 02:00:00 | Stop: 2023-07-06

## 2023-07-07 MED ADMIN — umeclidinium (INCRUSE ELLIPTA) 62.5 mcg/actuation inhaler 1 puff: 1 | RESPIRATORY_TRACT | @ 15:00:00 | Stop: 2023-07-07

## 2023-07-07 MED ADMIN — lidocaine (ASPERCREME) 4 % 1 patch: 1 | TRANSDERMAL | @ 06:00:00 | Stop: 2023-07-07

## 2023-07-07 MED ADMIN — nicotine polacrilex (NICORETTE) gum 2 mg: 2 mg | BUCCAL | @ 10:00:00 | Stop: 2023-07-07

## 2023-07-07 MED ADMIN — colchicine (COLCRYS) tablet 0.6 mg: .6 mg | ORAL | @ 15:00:00 | Stop: 2023-07-07

## 2023-07-07 MED ADMIN — ezetimibe (ZETIA) tablet 10 mg: 10 mg | ORAL | @ 15:00:00 | Stop: 2023-07-07

## 2023-07-07 NOTE — Unmapped (Addendum)
It was discovered on discharge that patient had her cellphone for the duration of her hospital stay, which is against [facility] policy. Patient said she had it in her peronal rollator seat pocket, and then had it in her bed with her. Cellphone was hidden from 1:1 sitter by patient having her back towards the sitter. Patient also had the unit remote control in her possession overnight. Patient reminded of unit policies before escorted off the unit by discharge hospitality. Charge RN aware.

## 2023-07-07 NOTE — Unmapped (Signed)
Patient discharge at this time. Discharge instructions reviewed and patient verbalized understanding. Patient is calm and cooperative at time of discharge, denies SI/HI/AH/VH. Personal belongings returned to patient and confirmed to be all there.

## 2023-07-07 NOTE — Unmapped (Signed)
I assumed care of this patient at 1900.    Briefly, this is a 57 year old with history of chronic pain.  Her chronic pain has led to her feeling suicidal.  The patient has a history of self-harm by cutting and burning herself.    Previous provider discussed this with the patient's PCP who stated there was significant concern for the patient completing suicide.    IVC had been completed earlier.  However, hospital police have been unavailable to transfer the patient due to staffing problems.    The patient has become increasingly adamant that she does not want to go to main campus.  Unfortunately this is the only place that psychiatric services are available for her.  She states she is unwilling to be transported.    At 2120 recontact the hospital police asking her to reprioritize the patient's transfer.  The patient is attempting to call family members to have them pick her up so that she can avoid transfer.  I have made the police aware of this complication.

## 2023-07-07 NOTE — Unmapped (Signed)
[facility] SUBJECT FOR NEW MSG: Medication Request Patient Name: Tonya Wood   Caller: Self (Patient)  Name of Caller:  Have you contacted your pharmacy? yes      Last seen in-person: 07/02/2023  Last telemedicine visit: 08/21/2022        Medication Name: oxyCODONE (ROXICODONE)   Dosage:5 MG immediate release tablet   Route: Oral (PO)  Frequency: Twice a day (BID)  Day Supply Requested: 30  Pharmacy (Name & Address): [facility]   [address], [place] [address]    Pharmacy Phone Number: [phone]   Are there refills on medications? yes  Has [facility] refilled this medication before? Yes  Patient said that she is out of medication and needs a refill today.

## 2023-07-07 NOTE — Unmapped (Signed)
Patient is currently sitting on bed watching television.  Patient still in possession of her belongings.

## 2023-07-07 NOTE — Unmapped (Signed)
ED Progress Note    July 07, 2023     Assumed care of patient at 0900    Briefly, this is a 57 year old with history of chronic pain. Her chronic pain has led to her feeling suicidal. The patient has a history of self-harm by cutting and burning herself. She was transferred to [facility] main campus for evaluation by psychiatry.    Plan at this time is to restart home medications and psychiatry consult.    Updates:  - pt evaluated by psychiatry, recommending discharge
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
## 2023-07-07 NOTE — Unmapped (Signed)
Patient states she is completely out of her medication.  She states she is in excruciating pain due to her bad flare of up MS.  She states she was in the hospital.  She is requesting her medication be sent to the pharmacy right away as she is in pain still.

## 2023-07-07 NOTE — Unmapped (Signed)
Emergency Medicine Access Physician [facility])  [facility] - Transfer Request Note    Requesting Provider: Dr. Illene Labrador    Requesting Hospital: [facility]    Requesting Service: Emergency Medicine      Reason for transfer request: SI    Overview of ED course at transferring hospital: Tonya Wood is a 57 y.o. female being transferred from [facility] for psychiatric emergency services evaluation.  She has a history of chronic neck and back pain and has been having escalating statements (corroborated by her primary care physician) of suicidality due to pain the patient has been medically cleared and is under IVC.  She is being transported by police.  Psychiatric emergency services at [facility] are aware of the patient.    Was the patient accepted to the [facility] ED in transfer: Yes.  If no, why?: N/A    Accepting service/expected service information:  Has an inpatient or consulting service been contacted by the [facility], [facility] or the referring provider?: Yes.  If admitting/consult service contacted by [facility]:  Time of discussion: Documented above in overview section  Name/service (e.g. Dr. Floyde Parkins, neprology) of inpatient team member/consultant: Documented above in overview section  Role of admit/consult service member: Documented above in overview section  Brief summary of call: Documented above in overview section  Anticipated Inpatient Bed Type Needed: To be determined    Outside Hospital Imaging:  Was Imaging Done at the Referring Hospital:  No.  If yes, what studies?: Documented above in overview section  [employer]:  [employer], images available in [employer]

## 2023-07-07 NOTE — Unmapped (Signed)
Patient with history of chronic neck and back pain endorsing SI. Family increasingly worried about patient.

## 2023-07-07 NOTE — Unmapped (Shared)
[facility]    Psychiatry Emergency Service     Follow-up Consult      Service Date:  July 07, 2023  Admit Date/Time: 07/06/2023  2:07 PM  LOS: Total duration of encounter: 1 day  Service requesting consult:  Emergency Medicine   Requesting Attending Physician:  Melvern Banker, MD  Location of patient:IN-PERSON-- Main ED  Consulting Attending: Andree Coss, MD  Consulting Resident/Provider: Hewitt Blade, MS3      Interval History and Follow-up Assessment    Tonya Wood is a 57 y.o., Black/African American race, Not Hispanic, Latino/a, or Spanish origin ethnicity, ENGLISH speaking female with a history of schizoaffective disorder, bipolar type I, PTSD, MS, HTN, HLD, T2DM, COPD, hx of substance use disorder who is here for evaluation of SI in the context of chronic/uncontrolled pain. Patient initially presented to [facility] ED on 07/06/2023 (BIB by mother) for uncontrolled pain (recently ran out of oxycodone prescription and has been taking OTC medications for pain without relief) and expressed that she could not go on living this way, which prompted psychiatry consult.      On interview, patient was calm and cooperative. Patient reports that she has no thought, plan, or intention of hurting herself. She reports that she was merely expressing the severity of her pain and was misunderstood as expressing SI. Patient reports ongoing stressors related to chronic pain and living with her seven grandchildren, other children, and mother. Patient has history of trauma, self-harm behaviors (cutting/burning), family loss and chronic grief, and has remote history of suicide attempt in 1998. Patient cites several reasons for wanting to continue living, including close relationships with children and grandchildren, strong religious views, and social support. Collateral information from patient's daughter indicates that patient's primary concern is chronic pain and daughter does not report any concerns for patient's safety. Both patient and daughter deny any access to weapons at the home. Patient expresses desire to return home as her daughter is out of the state for work and she needs to help take care of her grandchildren.     Although this patient presented to the ED for possible SI, the patient does not appear to be at imminent risk of dangerousness to self or dangerousness to others at this time. Patient denies any thought, plan, or intent of suicide. Given patient's denial of plan or intent, noted protective factors, optimism that relief may occur following additional medical procedures and pain management, no recent attempts, and lack of psychotic symptoms, there is no indication for inpatient hospitalization at this time. Patient does not currently meet criteria for [place] involuntary commitment. While future psychiatric events cannot be accurately predicted, the patient does not necessitate nor desire further acute inpatient psychiatric care and it is not felt that inpatient hospitalization would be therapeutic or beneficial for the patient at this time. Patient will be discharged with plan for close outpatient follow-up with pain management clinic, [facility], and PCP. The patient weighed the risks and benefits of an inpatient admission and voiced understanding of the risks involved in returning to home. The patient responded well to supportive and problem solving therapeutic interventions.        A thorough psychiatric evaluation has been completed including evaluation of the patient, collecting collateral history from patient's daughter Marnette Burgess via phone call), reviewing available medical/clinic records, and discussing treatment recommendations. A suicide and violence risk assessment was performed as part of this evaluation. Specific questioning about thoughts, plans, suicidal intent, and self-harm  indicate the patient is NOT at acutely elevated risk of suicide/dangerousness to others and further worsening of psychiatric condition. Risk factors for self-harm/suicide: previous suicide attempt(s), self-harming behaviors (cutting/burning), history of substance abuse, recent loss (son passed away in last few years unexpectedly), recent bereavement, and previous acts of self-harm. Protective Factors against self-harm/suicide: lack of active SI, no know access to weapons or firearms, no previous suicide attempts in the last 6 months, motivation for treatment, supportive family, supportive friends, sense of responsibility to family and social supports, minor children living at home, presence of an available support system, employment or functioning in a structured work/academic setting, expresses purpose for living, religious or spiritual prohibition to suicide/violence, and safe housing. Risk Factors for harm to others: recent loss, history of violent victimization, and exposure to violence. Protective factors against harm to others: no known history of violence towards others, no known history of threats of harm towards others, no active symptoms of psychosis, no active symptoms of mania, no previous acts of violence in current setting, positive social orientation, religiosity, and connectedness to family. Inpatient hospitalization is NOT warranted at this time. This was explicitly discussed with patient.      Diagnoses:   Active Problems:    MDD (major depressive disorder), recurrent episode, severe (CMS-HCC)      Stressors: chronic pain, currently primary caregiver for seven grandchildren between ages of 4 years and 11 years, October is month of her son's (who passed away) birthday and her husband's death    Plan   -- Disposition: The Psychiatry Emergency Service treatment team recommends discharge to home. Patient will continue care outpatient and have follow-up with PCP, pain management clinic, and [facility] clinic. Patient was supposed to have a virtual assessment with [facility] today (07/07/2023), and patient was encouraged to reschedule this appointment and follow-up. Patient also endorses chronic pain, having recently run out of her oxycodone prescription, and patient was encouraged to follow-up as soon as she can with her pain management clinic. The Psychiatry Emergency Service consultant recommends returning to the nearest ED or calling 911 if the patient develops any intent or plan of harming self or others.      -- Observation Level:   This patient had recent ASQ screening performed that rated them as Calculated Risk Score: Imminent Risk   Based on my clinical evaluation, I estimate the patient to be at low risk for suicide in the current setting.  This decision is based on my review of the chart including patient's history and current presentation, interview of the patient, mental status examination, and consideration of suicide risk including evaluating suicidal ideation, plan, intent, suicidal or self-harm behaviors, risk factors, and protective factors. This will be reassessed if there is clinically significant change in the status of the patient. This judgment is based on our ability to directly address suicide risk, implement suicide prevention strategies and develop a safety plan while the patient is in the clinical setting.     -- Medication Recommendations: Patient endorses chronic pain, managed with oxycodone and OTC Tylenol and ibuprofen. PDMP was carefully reviewed. Encouraged patient to follow-up with pain management clinic for prescription renewals for her pain medications.       -- Behavioral Recommendations: None     -- Resources: Patient was encouraged to follow-up with pain management clinic and [facility] upon discharge.     Thank you for this consult. Should you have any questions regarding the assessment, plan, or recommendations, please contact  PES team by sending an Epic Chat to [facility] Psych ED Consult Team.     TIME SPENT: 60 minutes    INTERVENTION: risk assessment, supportive psychotherapy, and care coordination.    Staffed w/ PES MD:  Patient was seen and plan of care was discussed with the PES Attending MD, Andree Coss, MD, who agrees with the above statement and plan.      Subjective:   Reason for Continued Consultation: Safety Evaluation    Pt Interview: Patient was calm and cooperative throughout the interview. Patient reports ongoing stressors in her life, particularly related to her chronic pain and home life. Patient has history of MS and reports that she has been experiencing chronic pain for years. She has recently had significantly increased pain in her jaw that she was told was related to her MS and possibly dental problems. She has plans for extractions of all of her teeth by [facility] in the next few months. Her chronic pain is managed by a pain medicine provider. She reports that she recently increased the frequency of oxycodone that she takes for pain from one 5 mg pill PRN to two 5 mg pills PRN. She reports that she ran out of her oxycodone supply and began taking several over-the-counter 500 mg Tylenol pills (reported she has taken up to 7 pills at a time without relief). Patient reports that the pain is severe and is the root of many of her symptoms. Patient also notes that she has significant stressors at home. She is the primary caregiver for seven grandchildren who currently live with her. She also helps care for several other grandchildren that do not live with her. Patient also has significant trauma history and became tearful when discussing this. She reports that she has never wanted to talk to a therapist because they don't know me and have not been through what I have been through. She reports that she has talked to her pastor who has provided significant help while working through her past experiences. Patient also notes that she was referred to [facility] for a virtual assessment but the appointment was today and she missed it. Patient reports that October is a particularly hard month for her because it is the month her husband passed away and the birth month of her son who passed away. Patient has history of trauma, self-harm behaviors (cutting/burning), family loss and chronic grief, and has remote history of suicide attempt in 1998. Patient reports that she has no thought, plan, or intention of hurting herself. She reports that she was merely expressing the severity of her pain when she came to the ED and was misunderstood as expressing SI. Patient cites several reasons for wanting to continue living, including close relationships with children and grandchildren, strong religious views, and social support. Patient denies any access to weapons at the home. Patient expresses desire to return home as her daughter is out of the state for work and she needs to help take care of her grandchildren.     Pertinent Negatives: The pt denies SI, recent aborted attempts, suicidal planning or research, HI, psychosis, hallucinations, CAH, mania, paranoia, hypomania, thought insertion/blocking, and marked impulsivity.       COLLATERAL:   Patient's daughter Cheryl Flash [place], [phone]): Daughter is currently located out of the state for work but usually lives with patient. Daughter reports that patient has been experiencing chronic pain for years and does not get adequate pain relief on her current regimen. Daughter reports that patient has  a lot of stress in her life related to caring for her grandchildren and managing the house. Daughter reports that patient has not expressed any thoughts of self-harm. Daughter reports that there are no firearms or other weapons in the home. Daughter reports no known illicit substance use by patient. Daughter denies any concerns for patient's safety if she were discharged from hospital. Patient denies any knowledge of recent self-harm by patient. Daughter expresses desire for patient to be discharged from the hospital since she is out of the state and patient is currently helping her take care of her children.      Objective:     VS:   Vital Signs  Temp: 36.6 ??C (97.9 ??F)  Temp Source: Oral  Heart Rate: 94  Heart Rate Source: Monitor  Resp: 20  BP: 118/78  MAP (mmHg): 87  BP Location: Right arm  BP Method: Automatic  Patient Position: Sitting    Mental Status Exam:  Appearance:    Appears stated age and Clean/Neat, Patient    Behavior:   Calm, Cooperative, Direct eye contact, and Polite   Motor:   No abnormal movements   Speech/Language:    Normal rate, volume, tone, fluency   Mood:    Angry, because they misunderstood me   Affect:   Calm, Cooperative, and Euthymic   Thought process:   Logical, linear, clear, coherent, goal directed   Thought content:     Denies SI, HI, self harm, delusions, obsessions, paranoid ideation, or ideas of reference   Perceptual disturbances:     Denies auditory and visual hallucinations, behavior not concerning for response to internal stimuli     Orientation:   Oriented to person, place, time, and general circumstances   Attention:   Able to fully attend without fluctuations in consciousness   Concentration:   Able to fully concentrate and attend   Memory:   Immediate, short-term, long-term, and recall grossly intact    Fund of knowledge:    Consistent with level of education and development   Insight:     Intact   Judgment:    Intact   Impulse Control:   Intact       ++SW:++    Please refer to EM provider note for review of any labs, EKG or radiology studies.      ++APP++     ROS:  Patient denies physical complaints outside of emergency medical physical assessment.     PHYS:  PHYSICAL EXAM:  General: NAD  Eyes: Sclera clear. No nystagmus or discharge  ENT: Hearing grossly intact  Lungs: Non-labored breathing  Neuro: Normal gait, no tremor observed     Hewitt Blade, MS3  [facility]

## 2023-07-07 NOTE — Unmapped (Addendum)
[facility] Care   Psychiatry Emergency Service  Initial Consult      Service Date: July 07, 2023  Admit Date/Time: 07/06/2023  2:07 PM  LOS: Total duration of encounter: 1 day   Service requesting consult: Emergency Medicine   Requesting Attending Physician: No att. providers found  Location of patient and modality: IN-PERSON--[facility]  Consulting Attending: Lynnea Maizes, MD  Consulting Resident/Provider: Ashok Cordia, LCSW     Assessment   Tonya Wood is a 57 y.o., Black/African American race, Not Hispanic, Latino/a, or Spanish origin ethnicity, ENGLISH speaking female with a history of schizoaffective disorder, bipolar type,  MS, HTN, HLD, T2DM, COPD, hypothyroidism PTSD, hx of substance use disorder who presents to ED for complaint of pain, cough, n/v, and psych consulted for evaluation of SI, secondary to chronic/uncontrolled pain.     Pt presents with acute ongoing stressor, depressive cognitions of hopelessness, helplessness, along with recent vague SI noted by several providers over last over last several weeks/months, hx of psychotic and depressive disorders, no current engagement in treatment, recent engagement in NSSI, and noted a past suicide attempt, there is concern for the safety and stability of the patient at this time. Given denial of plan or intent, noted protective factors, optimism that relief may occur following upcoming medical procedures, hx of past attempt being remote, and lack of psychotic symptoms the benefit and need for inpt hospitalization is unclear at this time. Additional information from collateral source of daughter and/or pt's mother would be of great benefit in determining whether presentation is chronic (or baseline) vs acute in order to identify best disposition of pt.      The patient presented to the ED for SI, and at the time of evaluation there is not enough collateral information to support that patient's protective factors outweigh the assessed risk factors of evaluation. There is reason to believe that gaining more information from family would substantially alter the treatment course, but they are not available at the time of this evaluation. It is recommended that patient be monitored in the ED until these collateral sources are available given the risk factors considered below.  The patient does meet [facility] involuntary commitment criteria at this time. This was explained to the patient, who voiced understanding. A thorough psychiatric evaluation has been completed including evaluation of the patient, collecting collateral history from daughter, , reviewing available medical/clinic records, and discussing treatment recommendations. A suicide and violence risk assessment was performed as part of this evaluation. Risk factors for self-harm/suicide: previous suicide attempt(s), feelings of hopelessness, self-harming behaviors (cutting/burning selt), recent bereavement, previous acts of self-harm, and chronic poor judgment. Protective Factors against self-harm/suicide: no know access to weapons or firearms, no previous suicide attempts in the last 6 months, supportive family, sense of responsibility to family and social supports, minor children living at home, presence of an available support system, and safe housing. Risk Factors for harm to others: high emotional distress and past substance abuse. Protective factors against harm to others: no known history of violence towards others, no known history of threats of harm towards others, no active symptoms of psychosis, no active symptoms of mania, and connectedness to family.    Risk factors for self-harm/suicide: history of substance abuse and diagnosis of schizoaffective disorder. Protective Factors against self-harm/suicide: lack of active SI, motivation for treatment, has access to clinical interventions and support, sense of responsibility to family and social supports, expresses purpose for living, and current treatment compliance. Risk Factors  for harm to others: high emotional distress and past substance abuse. Protective factors against harm to others: no active symptoms of psychosis and no active symptoms of mania.     Diagnoses:   Active Problems:    MDD (major depressive disorder), recurrent episode, severe (CMS-HCC)        Stressors: chronic pain, currently the primary caregiver for 7 grandchildren ages 11-4, financial       Plan   -- Safety Concerns: We recommend that patient be observed in the Emergency Department and reevaluated tomorrow.     -- Observation Level:   This patient had recent ASQ screening performed that rated them as Calculated Risk Score: Imminent Risk   Based on my clinical evaluation, I estimate the patient to be at low risk for suicide in the current setting.  At this time we recommend Q 15 Minute Obs.   This decision is based on my review of the chart including patient's history and current presentation, interview of the patient, mental status examination, and consideration of suicide risk including evaluating suicidal ideation, plan, intent, suicidal or self-harm behaviors, risk factors, and protective factors. This will be reassessed if there is clinically significant change in the status of the patient. This judgment is based on our ability to directly address suicide risk, implement suicide prevention strategies and develop a safety plan while the patient is in the clinical setting.      -- Disposition: To be determined at the time of reevaluation     -- Admission Status: Involuntary- This has been explained to the patient or appropriate surrogate. 1st QPE completed; please call hospital police if patient attempts to leave..     -- Further Work-up:   The purpose of writer's decision to reevaluate below:  Obtain collateral from daughter, Tonya Wood [phone]). Attempts made x2, call not answered, vm left     -- Psychiatric Interventions: defer at this time    -- General Medical Interventions: Defer to EM     Thank you for this consult. Should you have any questions regarding the assessment, plan, or recommendations, please contact PES team by sending an Epic Chat to [facility] Psych ED Consult Team.     TIME SPENT: 35 minutes    INTERVENTION: risk assessment, supportive psychotherapy, psychoeducation, motivational interviewing, and safety planning.      Staffed w/ EM Provider: Patient and plan of care were discussed with the EM Consulting Provider, Marvis Moeller, MD , who agrees with the above statement and plan.     Subjective:      Reason for consult: SI    Per Triage:   Presents persistent cough, right neck and shoulder pain and N/V after eating x 1 month. Hx of MS. Tearful during triage. Tonya Meigs, RN  Patient with history of chronic neck and back pain endorsing SI. Family increasingly worried about patient. -Tonya Ebbs, RN     Per EM Provider: EMAREE MANZELLA is a 57 y.o. female PMH COPD, diabetes mellitus, hypertension, hyponatremia, MDD, cocaine use disorder presenting with suicidal ideation in the setting of chronic pain.  Patient notes ongoing right-sided neck pain has been present for the past few months but worsening over the past several days.  States that she does not get any relief of her pain.  Notes that pain is worse with palpation and movement of her arm.  Denies numbness or weakness in bilateral upper or lower extremities.  Also notes ongoing nausea every time that she eats to the point  that she has not been eating as much.  Reports dry heaving but no frank emesis.  She states that due to these symptoms which have been ongoing for the past few months without resolve, she has been feeling suicidal.  Does note that she has cut herself a few times as well as burned herself in place that people cannot see.  She does note prior history of psychiatric hospitalizations.  Denies chest pain, shortness of breath, abdominal pain, dysuria, hematuria, any other symptoms or concerns at this time. -Tonya Pyle, PA    I assumed care of this patient at 1900.  Briefly, this is a 57 year old with history of chronic pain.  Her chronic pain has led to her feeling suicidal.  The patient has a history of self-harm by cutting and burning herself.  Previous provider discussed this with the patient's PCP who stated there was significant concern for the patient completing suicide.  IVC had been completed earlier.  However, hospital police have been unavailable to transfer the patient due to staffing problems.  The patient has become increasingly adamant that she does not want to go to main campus.  Unfortunately this is the only place that psychiatric services are available for her.  She states she is unwilling to be transported.  At 2120 recontact the hospital police asking her to reprioritize the patient's transfer.  The patient is attempting to call family members to have them pick her up so that she can avoid transfer.  I have made the police aware of this complication. - Betsy Pries, MD      Pt Interview: Pt reports that she has been dealing with severe neck and shoulder pain due to MS flair for several months. She states that at time the pain is so unbearable she is unable to function and that she currently has an aiding coming in to help her during the week. She reports that on Tuesday she went to PCP due to pain and that during this visit she made a statement similar to not wanting to live like this or asking who would want to live like this. She states that this is just her verbalizing her frustrations and was not meant to be suicidal in nature. She reports that she currently takes Depakote but that her mental health is not her primary focus of clinical attention at current. She reports being scheduled for procedure next month to remove her teeth in an effort to manage nerve pain, in which the outcome is to reduce pain, and also allow her to obtain a knee replacement as well. She is hopeful that she will obtian releif following this and in turn her depressive feelings, hopelessness, and vague SI will cease. She states that she has 20 grandchildren and that she is actively involved in the care of all of them at various times and that they along with her other family are her primary protective factors. Pt stated feeling that her words should have not been over exaggerated noting that she has attempted suicide in the past and that if she was truly suicidal she could have acted by now.       Pertinent Negatives: The patient denies/does not present with SI, recent aborted attempts, suicidal planning or research, HI, psychosis, hallucinations, CAH, mania, paranoia, hypomania, and thought insertion/blocking.       COLLATERAL: None obtained at this time. Attempts made but unsuccessful    Social History     Socioeconomic History    Marital status: Widowed  Tobacco Use    Smoking status: Every Day     Current packs/day: 2.00     Average packs/day: 2.0 packs/day for 37.7 years (75.4 ttl pk-yrs)     Types: Cigarettes     Start date: 10/17/1985    Smokeless tobacco: Never   Vaping Use    Vaping status: Never Used   Substance and Sexual Activity    Alcohol use: No    Drug use: Never    Sexual activity: Not Currently   Social History Narrative    Taking care of grandchildren in [place]         PSYCHIATRIC HX:     -Current provider(s):  No psychiatric but resources for [employer] given     -Suicide attempts/SIB: Attempts: YES, per chart review 1998 jumped off bridge    -Psych Hospitalizations:  YES, multiple    -Med compliance hx: has not been on psych meds in a long while per patient    -Fa hx suicide: not asked        SUBSTANCE ABUSE HX:     -Current using substance: NO    -Hx w/d sxs: not asked    -Sz Hx: not asked    -DT Hx:not asked        SOCIAL HX:    -Current living environment: Lives with family    -Current support(s): children and parent/s    -Violence (perp): NO -Access to Firearms: NO       Objective:     VS:   Vital Signs  Temp: 36.6 ??C (97.9 ??F)  Temp Source: Oral  Heart Rate: 86  Heart Rate Source: NIBP  Resp: 19  BP: 122/94  MAP (mmHg): 103  BP Location: Right arm  BP Method: Automatic  Patient Position: Sitting    Mental Status Exam:  Appearance:     Appears older than stated age   Behavior:   Calm, Cooperative, and Direct eye contact   Motor:   No abnormal movements   Speech/Language:    Normal rate, volume, tone, fluency   Mood:   Anxious   Affect:   Anxious, Full, and Irritable   Thought process:   Logical, linear, clear, coherent, goal directed   Thought content:     Denies SI, HI, self harm, delusions, obsessions, paranoid ideation, or ideas of reference   Perceptual disturbances:     Denies auditory and visual hallucinations, behavior not concerning for response to internal stimuli     Orientation:   Oriented to person, place, time, and general circumstances   Attention:   Able to fully attend without fluctuations in consciousness   Concentration:   Able to fully concentrate and attend   Memory:   Immediate, short-term, long-term, and recall grossly intact    Fund of knowledge:    Consistent with level of education and development   Insight:     Fair   Judgment:    Impaired   Impulse Control:   Fair       Please refer to EM provider note for review of any labs, EKG or radiology studies.      ++Safety Note: All patients awaiting psychiatric evaluation and/or disposition in the ED shall be placed in a locked behavioral health area as space permits. When in this setting they will be monitored via live video feed++

## 2023-07-08 DIAGNOSIS — J439 Emphysema, unspecified: Principal | ICD-10-CM

## 2023-07-09 DIAGNOSIS — R059 Cough, unspecified type: Principal | ICD-10-CM

## 2023-07-09 DIAGNOSIS — J449 Chronic obstructive pulmonary disease, unspecified: Principal | ICD-10-CM

## 2023-07-09 DIAGNOSIS — I10 Essential (primary) hypertension: Principal | ICD-10-CM

## 2023-07-09 DIAGNOSIS — F17200 Nicotine dependence, unspecified, uncomplicated: Principal | ICD-10-CM

## 2023-07-09 MED ORDER — NICOTINE (POLACRILEX) 4 MG GUM
ORAL | 3 refills | 5 days | Status: CP | PRN
Start: 2023-07-09 — End: ?
  Filled 2023-07-11: qty 110, 5d supply, fill #0

## 2023-07-09 MED ORDER — DIVALPROEX ER 500 MG TABLET,EXTENDED RELEASE 24 HR
ORAL_TABLET | Freq: Every day | ORAL | 1 refills | 30 days | Status: CP
Start: 2023-07-09 — End: 2024-07-08
  Filled 2023-07-11: qty 30, 30d supply, fill #0

## 2023-07-09 MED ORDER — AMLODIPINE 10 MG TABLET
ORAL_TABLET | ORAL | 3 refills | 90 days | Status: CP
Start: 2023-07-09 — End: 2024-07-08
  Filled 2023-07-11: qty 90, 90d supply, fill #0

## 2023-07-09 MED ORDER — STIOLTO RESPIMAT 2.5 MCG-2.5 MCG/ACTUATION SOLUTION FOR INHALATION
Freq: Every day | RESPIRATORY_TRACT | 0 refills | 14 days | Status: CP
Start: 2023-07-09 — End: ?
  Filled 2023-07-11: qty 4, 30d supply, fill #0

## 2023-07-10 NOTE — Unmapped (Signed)
Outgoing call to patient to discuss worsening cough.  Per pulm, CT scan only with minimal emphysema and most recent PTFs with only mild COPD, due for repeat PFTs in 2 weeks.  Ongoing cough x multiple months, getting worse, dry cough.  Of note, pt started combined amlodipine-benazepril (was previously just on amlodipine) in 04/2023, which is around when cough started.   Pt has not responded to steroids after COPDe and cough has continued to be dry.  Also did not respond to trial of lasix.  Suspect dry cough may just be from ACE-I.   Discussed with patient need to trial discontinuation of ACEi, will send for amlodipine 10 mg only script and DC combined med.     Also of note, pt has not been using Trelegy because she gets recurrent thrush/throat irritation from it.  I have sent Stiolto Respimat for her to try instead.     Pt in agreement with plan. We will follow up in clinic soon to review cough after stopping ACEi and starting Stiolto.    Luther Hearing MD, MPH  [facility] PGY3  (She/her/hers)

## 2023-07-11 MED FILL — CHLORHEXIDINE GLUCONATE 0.12 % MOUTHWASH: OROMUCOSAL | 32 days supply | Qty: 946 | Fill #1

## 2023-07-11 MED FILL — COLCHICINE 0.6 MG TABLET: ORAL | 30 days supply | Qty: 30 | Fill #1

## 2023-07-11 MED FILL — ASPIRIN 81 MG TABLET,DELAYED RELEASE: ORAL | 90 days supply | Qty: 90 | Fill #1

## 2023-07-11 MED FILL — DULOXETINE 60 MG CAPSULE,DELAYED RELEASE: ORAL | 90 days supply | Qty: 90 | Fill #1

## 2023-07-11 MED FILL — VARENICLINE 1 MG TABLET: ORAL | 30 days supply | Qty: 30 | Fill #3

## 2023-07-11 MED FILL — DICLOFENAC 1 % TOPICAL GEL: TOPICAL | 25 days supply | Qty: 200 | Fill #2

## 2023-07-11 MED FILL — QUETIAPINE 200 MG TABLET: ORAL | 90 days supply | Qty: 90 | Fill #1

## 2023-07-11 MED FILL — MAGNESIUM OXIDE 400 MG (241.3 MG MAGNESIUM) TABLET: ORAL | 120 days supply | Qty: 120 | Fill #1

## 2023-07-11 MED FILL — JARDIANCE 25 MG TABLET: ORAL | 90 days supply | Qty: 90 | Fill #1

## 2023-07-11 MED FILL — AMANTADINE HCL 100 MG CAPSULE: ORAL | 30 days supply | Qty: 60 | Fill #3

## 2023-07-11 MED FILL — EZETIMIBE 10 MG TABLET: ORAL | 90 days supply | Qty: 90 | Fill #1

## 2023-07-15 NOTE — Unmapped (Signed)
[facility]  Care Management Progress Note               Purpose of contact: SW followed up on provider request to assist patient with psych referral.    Patient referred to [facility] on 07/02/23. Referral notes that they called patient and let her know they aren't scheduling until January 2025.    SW attempted to call patient to give her a community psych option [facility] in [place], phone: [phone]). No answer.    Shea Stakes, CCM  [facility] Manager  [facility]  [phone]

## 2023-07-16 ENCOUNTER — Ambulatory Visit: Admit: 2023-07-16 | Discharge: 2023-07-17 | Payer: PRIVATE HEALTH INSURANCE

## 2023-07-16 NOTE — Unmapped (Signed)
[facility] SUBJECT FOR NEW MSG: Referral Request Patient Name: Tonya Wood   Caller: Self (Patient)  Contact Method: Telephone Call: Time- Any Time [phone]   Referral Requested to: [facility]   Preferred Facility: External: Facility Name-[facility]  Phone Number-[phone] Fax [phone]  Reason for Referral: Patient states they have attempted to call several times to get a referral to be seen at [facility]. Pt states they haven't been able to schedule with [facility] without a referral and would like the referral to be sent as soon as possible   Has this issue for the referral been discussed with our provider before? yes  Referral Order Present?: no

## 2023-07-21 MED ORDER — OXYCODONE 5 MG TABLET
ORAL_TABLET | Freq: Two times a day (BID) | ORAL | 0 refills | 30 days | Status: CP | PRN
Start: 2023-07-21 — End: 2023-08-20
  Filled 2023-07-21: qty 60, 30d supply, fill #0

## 2023-07-21 MED ORDER — ALBUTEROL SULFATE HFA 90 MCG/ACTUATION AEROSOL INHALER
Freq: Four times a day (QID) | RESPIRATORY_TRACT | 0 refills | 12 days | Status: CN | PRN
Start: 2023-07-21 — End: 2024-07-20

## 2023-07-21 NOTE — Unmapped (Signed)
PA submitted via cover my meds on 07/21/2023 for Roxicodone 5 mg. Key: [id number].

## 2023-07-21 NOTE — Unmapped (Signed)
[facility] SUBJECT FOR NEW MSG: Prior Authorization for Meds Patient Name: Tonya Wood   Caller: Self (Patient)    Name of Insurance Company: [employer] [facility] [place], [place]   Name of Caller: [employer] Phone Number: n/a   Member ID Number: [id number]     Is this a PA initiation request? yes  Is this a PA status update? no  If yes, what is the status? N/a    Medication: oxyCODONE (ROXICODONE) 5 MG immediate release tablet   Has this medication received a prior auth in the past?: yes  Associated Diagnosis (Reason for Medication): Pain   Preferred Pharmacy: [facility]   [address] [place] [address]     Your insurance company will require [place] to list other medications you have tried for this same problem. Have you ever tried other medications for this same issue? yes  If so, what were they and why did you stop them? N/a

## 2023-07-22 NOTE — Unmapped (Signed)
[facility] SUBJECT FOR NEW MSG: Patient Advice Request Patient Name: Tonya Wood  Caller: Self (Patient)  Name of Caller: Warnell Forester Method: Telephone Call: Time- Any Time [phone]   Reason for Call: Patient would like to talk about getting care established with [facility] to make sure they can get a aid to come out and assist. Patient would like a call back to discuss getting it set up. Patient reached out at the beginning of the month to receive assistance and would like a call back to make sure they can get what they need.   Previously Discussed: yes  Appointment Offered: Declined  If offered accepted, scheduled appt date: NA

## 2023-07-22 NOTE — Unmapped (Signed)
[facility] SUBJECT FOR NEW MSG: Medication Request Patient Name: Tonya Wood   Caller: Self (Patient)  Name of Caller:  Have you contacted your pharmacy? no      Last seen in-person: 07/02/2023  Last telemedicine visit: 08/21/2022        Patient states she needs sensors and the reader for her glucose monitoring as hers has disappeared.  She is requesting a phone call regarding this equipment.  She has been unable to monitor her sugar for a week.    Patient states she has been calling for a while now and is frustrated as she is not getting a phone call back.  She feels this is a racism situation as to why she is not being called back and is requesting a phone call from someone so she can get take care of.

## 2023-07-23 ENCOUNTER — Ambulatory Visit: Admit: 2023-07-23 | Discharge: 2023-07-23 | Payer: PRIVATE HEALTH INSURANCE

## 2023-07-23 ENCOUNTER — Ambulatory Visit
Admit: 2023-07-23 | Discharge: 2023-07-23 | Payer: PRIVATE HEALTH INSURANCE | Attending: Physician Assistant | Primary: Physician Assistant

## 2023-07-23 DIAGNOSIS — E1159 Type 2 diabetes mellitus with other circulatory complications: Principal | ICD-10-CM

## 2023-07-23 DIAGNOSIS — Z794 Long term (current) use of insulin: Principal | ICD-10-CM

## 2023-07-23 DIAGNOSIS — G8929 Other chronic pain: Principal | ICD-10-CM

## 2023-07-23 DIAGNOSIS — G35 Multiple sclerosis: Principal | ICD-10-CM

## 2023-07-23 DIAGNOSIS — M2550 Pain in unspecified joint: Principal | ICD-10-CM

## 2023-07-23 DIAGNOSIS — G894 Chronic pain syndrome: Principal | ICD-10-CM

## 2023-07-23 DIAGNOSIS — M545 Chronic bilateral low back pain without sciatica: Principal | ICD-10-CM

## 2023-07-23 MED ORDER — BLOOD GLUCOSE TEST STRIPS
ORAL_STRIP | 11 refills | 0 days | Status: CP
Start: 2023-07-23 — End: 2024-07-22
  Filled 2023-07-29: qty 1, 365d supply, fill #0

## 2023-07-23 MED ORDER — BACLOFEN 10 MG TABLET
ORAL_TABLET | Freq: Three times a day (TID) | ORAL | 1 refills | 90 days | Status: CP | PRN
Start: 2023-07-23 — End: 2024-01-19
  Filled 2023-07-29: qty 270, 90d supply, fill #0

## 2023-07-23 MED ORDER — LANCETS
11 refills | 0 days | Status: CP
Start: 2023-07-23 — End: 2024-07-22
  Filled 2023-07-29: qty 102, 34d supply, fill #0

## 2023-07-23 MED ORDER — AMANTADINE HCL 100 MG CAPSULE
ORAL_CAPSULE | Freq: Two times a day (BID) | ORAL | 1 refills | 90 days | Status: CP
Start: 2023-07-23 — End: ?
  Filled 2023-07-29: qty 360, 90d supply, fill #0

## 2023-07-23 MED ORDER — PREGABALIN 75 MG CAPSULE
ORAL_CAPSULE | Freq: Three times a day (TID) | ORAL | 1 refills | 90 days | Status: CP
Start: 2023-07-23 — End: 2024-07-22
  Filled 2023-07-29: qty 270, 90d supply, fill #0

## 2023-07-23 MED ORDER — DIMETHYL FUMARATE 240 MG CAPSULE,DELAYED RELEASE
ORAL_CAPSULE | Freq: Two times a day (BID) | ORAL | 1 refills | 90 days | Status: CP
Start: 2023-07-23 — End: ?

## 2023-07-23 MED ORDER — BLOOD-GLUCOSE METER KIT WRAPPER
11 refills | 0 days | Status: CP
Start: 2023-07-23 — End: 2024-07-22

## 2023-07-23 NOTE — Unmapped (Signed)
Below is your visit summary:    **Once you finish the high dose weekly vitamin D then start take Vitamin D3 4,000 units daily ( over the counter) to prevent:   -Worsening symptoms of multiple sclerosis (MS), flare-ups, slow disease progression and developing new brain or spinal cord lesions.     For fatigue:  Amantadine 100 - 200 mg , one to two times a day as needed. Take the first dose early in the day and the second dose between noon and 2 pm. Side effect: It can keep you awake at night.     Your PCP stopped this medication: amlodipine-benazepril     **Follow up 6-7 months,   Dr. Kelle Darting at the [facility] clinic.on a Wednesday.     Thank you for choosing [facility]  We appreciate the opportunity to participate in your care. If you have questions or concerns, please do not hesitate to contact us by [facility] or by calling   Appointment Scheduling: [phone] to speak with one of our clinical support team members.      [place] MyChart Website: [email or web address]      Kahliya Fraleigh Jannett Celestine PA-C, MPAS  Division of Multiple Sclerosis  [facility] at [facility]  [address], [address]  [place], [place] [address]    Fax [phone]     In case of:  a suspected relapse (new symptoms or worsening existing symptoms, lasting for >24h)  OR  a need for an additional appointment for other reasons  OR   if you have other questions: please contact:       [facility] Desk   Phone: [phone]      Supportive Therapy, Patriciaann Clan, MSW, LCSW.        Phone [phone]    If you have questions for our  pharmacist, please call:      Worthy Flank, PharmD, CPP  Phone: [phone]      If you need financial assistance, please call:      Financial Assistance Unit      Phone: [phone]     Navigating the Challenges of MS: MS Navigator??   National MS Society: MS Navigator at [phone]  Finding answers and making decisions relies on having the right information at the right time. MS Navigator are highly skilled, compassionate professionals available Monday through Friday, 9:00 a.m. to 7:00 p.m. ET to connect you to the information, resources and support needed to move your life forward. These supportive partners help navigate the challenges of MS unique to your situation.    [facility] MS Clinic is a specialty clinic and there is a need for you to have a  primary care provider  who will take care of your non-neurological health.   Please set this up if you have not already done so.

## 2023-07-23 NOTE — Unmapped (Signed)
[facility]  Multiple Sclerosis / Neuroimmunology Division  Lovenia Debruler Doreatha Massed  Physician Assistant    Phone: [phone]  Fax: [phone]      Patient Name: Tonya Wood   Date of Birth: 08/23/1966  Medical Record Number: [id number]  [address]  [place] [address]     Direct entry by:  Cy Blamer, PA-C.    DATE OF VISIT: July 23, 2023    REASON FOR VISIT: Followup in the Neuroimmunology Clinic for evaluation of Multiple Sclerosis / Demyelinating Disease / or other Neurological problem.    I personally spent 41 minutes face-to-face and non-face-to-face in the care of this patient, which includes all pre, intra, and post visit time on the date of service.  All documented time was specific to the E/M visit and does not include any procedures that may have been performed.    During this visit the diagnostic and treatment plan were discussed with the patient in the shared decision making process, Treatment goals and medication safety profile have been discussed with the patient.      Patient was evaluated by Dr. Johnnye Lana 10/16/2022. Please refer to this note for details on patient's history of present illness.     ASSESSMENT AND PLAN:  A 57 y.o. African American female , RHD who has Adjustment disorder with mixed anxiety and depressed mood; Amaurosis fugax of right eye; Cocaine abuse with cocaine-induced mood disorder (CMS-HCC); Colonic polyp; COPD (chronic obstructive pulmonary disease) (CMS-HCC); Essential hypertension; Hallux rigidus of right foot; Hyperlipidemia; Hypothyroidism; Impaired cognition; MDD (major depressive disorder), recurrent episode, severe (CMS-HCC); Obesity; Onychomycosis; Post traumatic stress disorder (PTSD); Right sided weakness; Schizoaffective disorder, bipolar type (CMS-HCC); Severe tobacco use disorder; Type 2 diabetes mellitus (CMS-HCC); Healthcare maintenance; Knee pain; Allergic rhinitis; Ganglion cyst of dorsum of left wrist; Multiple sclerosis (CMS-HCC); Chronic pain syndrome; Chronic right shoulder pain; Upper back pain, chronic; Toe injury, right, sequela; Weight loss of more than 10% body weight; and Chronic, continuous use of opioids on their problem list.    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  Symptom onset in 2020  Disease onset: relapsing, possibly now in the progressive phase  05/21/2022 ANA positive 1;80, which is a non specific titer, ENA negative, RF negative, RPR non reactive, HIV negative, NMO IgG and MOG IgG negative  05/19/2022 Brain MRI shows questionable faint white matter changes in corpus callosum, lesions in deep white matter and brainstem.   05/03/2022 C spine MRI,  lesion on right cord at the level of C3, moderate cervical spondylosis, including mild/moderate canal narrowing, worst at C4-C5. Varying degrees of neural foraminal narrowing, worst at C4-C6, and T spine MRI study on 05/19/2022 showed no lesions in the thoracic spine.   Lesions are suggestive of a CNS demyelinating disease.   01/31/2024Laboratory screening for MS mimicking diseases: Extractable Nuclear Antigen Screen (ENA); - Anti-neutrophilic Cytoplasmic Antibody (ANCA); - Autoimmune myelopathy panel; - Copper, serum; - Vitamin A; - Vitamin E; - HTLV I/II Antibody; - Quantiferon TB Gold Plus; - Hepatitis B Core Antibody, total; - Hepatitis C Antibody= WNL or NCS .ANTIPHOSPHOLIPID Panel=  abnormal.  10/31/2022 LP IgG index = 1.2 and 2 or more OGBs.    -Started DMF 12/2022 refill and continue.  -Reviewed  with the patient 07/01/2023 yearly MRI reports of Brain, Cervical , and Thoracic spine,  with / without contrast,  compared to 05/19/2022 which showed limited due to motion but appears to have  unchanged lesions in the brain and cervical spine and not Tspine lesions.  -Reviewed from 07/06/2023 AST/ALT = wnl, from 07/02/2023 CBC/diff = NCS, from 12/16/2022 Vitamin D 25-OH = 12.5 ng/mL , (L).     **Abnormal APLP:  Component      Latest Ref Rng 10/16/2022 02/20/2023   Anticardiolipin IgG      0.0 - 23.0 [GPL'U] 1.0  4.0    Anticardiolipin IgM      0.0 - 11.0 [MPL'U] 14.0 (H)  18.0 (H)       - This is low titer, low titers are not clinically significant, so can stay on Aspirin, no need for anticoagulants from APLP perspective. .  No history of miscarriages.    **Fatigue:  -Increase  Amantadine to 200 mg BID.    **Muscle spasms:  -Continue / refill Baclofen 10 mg TID prn.    **Neuropathic bladder:  -Keep follow ups  [facility], scheduled.    **Swallowing problems:  --Rerefer to Speech therapy, scheduled.    **Pain:  -Followed by [facility]  pain clinic.   -Continue / refill Lyrica 75 mg TID. She was taking as needed, advised to take daily TD.    **Follow up 6-7 months,  Dr. Maxine Glenn at the [facility] clinic.    INTERVAL HISTORY / CHIEF COMPLAINT :  Seen in the ED 07/06/2023 for  suicidal ideation in the setting of chronic pain   Current symptoms: (If blank =  none).  Bladder / bowel / sexual function: constipation, hard to urinate at times.No leakage, some urgency. No UTI.Followed by [facility].  Fatigue: Interferes with ADL's >50%. Amantadine helps  but not enough.  Depression / decrease in mentation / Euphoria:  PHQ9 = 15, denies SI/HI.    Vision/double vision:  Speech, swallowing problems: Sometimes unable to swallow, 3-4 times a month , both food and liquids.  Weakness:   Tingling/numbness/pain: Oxycodone, naprosyn for right arm pain by PCP. Now pain is all over whole body.  Balance/coordination problems:  Memory, mood: Forgetfulness  Gait:  Falls:  Headaches:   Seizures:  Other symptoms: One cane.    VITAL SIGNS  BP 120/87 (BP Site: R Arm, BP Position: Sitting, BP Cuff Size: Medium)  - Pulse 101  - Temp 36.6 ??C (97.8 ??F) (Temporal)  - Ht 170.2 cm (5' 7)  - Wt 83.6 kg (184 lb 3.2 oz)  - BMI 28.85 kg/m??     PHYSICAL EXAMINATION:  GENERAL:  Alert and oriented to person, place, time and situation. Recent and remote memory intact.      Neurological Examination:   Cranial Nerves:   II, III- Pupils are equal 3 mm and reactive to light b/l.  III, IV, VI- extra ocular movements are intact, No ptosis, no nystagmus.  V- sensation of the face intact b/l.  VII- face symmetrical, no facial droop, normal facial movements with smile/grimace  VIII- Hearing grossly intact.  IX and X- symmetric palate contraction.  XI- Full shoulder shrug bilaterally  XII- Tongue protrudes midline, full range of movements of the tongue   Motor Exam:      Muscles UEs   LEs     R L   R L   Deltoids 5/5 5/5 Hip flexors  5/5 5/5   Biceps 5/5 5/5 Hip extensors 5/5 5/5   Triceps 5/5 5/5 Knee flexors 5/5 5/5   Hand grip 5/5 5/5 Knee extensors 5/5 5/5      Foot dorsal flexors 5/5 5/5  Foot plantar flexors 5/5 5/5      Normal bulk and tone.  No clonus.    Reflexes R L   Brachioradialis +1 +1   Biceps +1 +1   Triceps +1 +1   Patella +1 +1   Achilles +1 +1     Negative babinski.    Sensory UEs LEs    R L R L   Light touch WNL WNL WNL WNL   Pin prick WNL WNL WNL WNL   Vibration, >10 seconds  WNL WNL WNL WNL   Proprioception   WNL WNL      Cerebellar/Coordination:  Rapid alternating movements, finger-to-nose and heel-to-shin  bilaterally demonstrates no abnormalities.    Romberg was negative with eyes closed.    Gait: slow, wide base, ataxic, Unable to tandem, heel, and toe gait without difficulty.   Using walker.    REVIEW OF SYSTEMS:  A 10-systems review was performed and, unless otherwise noted, declared negative by patient.    Admission on 07/06/2023, Discharged on 07/07/2023   Component Date Value Ref Range Status    SARS-CoV-2 PCR 07/06/2023 Negative  Negative Final    Alcohol, Ethyl 07/06/2023 <10  <=10 mg/dL Final    Sodium 16/06/9603 139  135 - 145 mmol/L Final    Potassium 07/06/2023 3.7  3.4 - 4.8 mmol/L Final    Chloride 07/06/2023 105  98 - 107 mmol/L Final    CO2 07/06/2023 26.1  20.0 - 31.0 mmol/L Final    Anion Gap 07/06/2023 8  5 - 14 mmol/L Final    BUN 07/06/2023 14  9 - 23 mg/dL Final    Creatinine 54/05/8118 0.62  0.55 - 1.02 mg/dL Final    BUN/Creatinine Ratio 07/06/2023 23   Final    eGFR CKD-EPI (2021) Female 07/06/2023 >90  >=60 mL/min/1.56m2 Final    Glucose 07/06/2023 132  70 - 179 mg/dL Final    Calcium 14/78/2956 10.4  8.7 - 10.4 mg/dL Final    Albumin 21/30/8657 4.2  3.4 - 5.0 g/dL Final    Total Protein 07/06/2023 7.4  5.7 - 8.2 g/dL Final    Total Bilirubin 07/06/2023 0.9  0.3 - 1.2 mg/dL Final    AST 84/69/6295 16  <=34 U/L Final    ALT 07/06/2023 11  10 - 49 U/L Final    Alkaline Phosphatase 07/06/2023 92  46 - 116 U/L Final    Pregnancy Test, Urine 07/06/2023 Negative  Negative Final    Amphetamines Screen, Ur 07/06/2023 Negative  <500 ng/mL Final    Barbiturates Screen, Ur 07/06/2023 Negative  <200 ng/mL Final    Benzodiazepines Screen, Urine 07/06/2023 Negative  <200 ng/mL Final    Cannabinoids Screen, Ur 07/06/2023 Negative  <20 ng/mL Final    Methadone Screen, Urine 07/06/2023 Negative  <300 ng/mL Final    Cocaine(Metab.)Screen, Urine 07/06/2023 Negative  <150 ng/mL Final    Opiates Screen, Ur 07/06/2023 Negative  <300 ng/mL Final    Fentanyl Screen, Ur 07/06/2023 Negative  <1.0 ng/mL Final    Oxycodone Screen, Ur 07/06/2023 Positive (A)  <100 ng/mL Final    Buprenorphine, Urine 07/06/2023 Negative  <5 ng/mL Final    TSH 07/06/2023 1.450  0.550 - 4.780 uIU/mL Final    WBC 07/06/2023 3.9  3.6 - 11.2 10*9/L Final    RBC 07/06/2023 5.25 (H)  3.95 - 5.13 10*12/L Final    HGB 07/06/2023 15.6 (H)  11.3 - 14.9 g/dL Final    HCT 28/41/3244 46.0 (H)  34.0 -  44.0 % Final    MCV 07/06/2023 87.5  77.6 - 95.7 fL Final    MCH 07/06/2023 29.7  25.9 - 32.4 pg Final    MCHC 07/06/2023 34.0  32.0 - 36.0 g/dL Final    RDW 16/06/9603 13.8  12.2 - 15.2 % Final    MPV 07/06/2023 8.2  6.8 - 10.7 fL Final    Platelet 07/06/2023 225  150 - 450 10*9/L Final    nRBC 07/06/2023 0  <=4 /100 WBCs Final    Neutrophils % 07/06/2023 61.2  % Final Lymphocytes % 07/06/2023 27.3  % Final    Monocytes % 07/06/2023 10.0  % Final    Eosinophils % 07/06/2023 1.1  % Final    Basophils % 07/06/2023 0.4  % Final    Absolute Neutrophils 07/06/2023 2.4  1.8 - 7.8 10*9/L Final    Absolute Lymphocytes 07/06/2023 1.1  1.1 - 3.6 10*9/L Final    Absolute Monocytes 07/06/2023 0.4  0.3 - 0.8 10*9/L Final    Absolute Eosinophils 07/06/2023 0.0  0.0 - 0.5 10*9/L Final    Absolute Basophils 07/06/2023 0.0  0.0 - 0.1 10*9/L Final    Glucose, POC 07/06/2023 118  70 - 179 mg/dL Final    Glucose, POC 54/05/8118 166  70 - 179 mg/dL Final    Glucose, POC 14/78/2956 178  70 - 179 mg/dL Final    Glucose, POC 21/30/8657 184 (H)  70 - 179 mg/dL Final   Office Visit on 07/02/2023   Component Date Value Ref Range Status    Hemoglobin A1C 07/02/2023 6.2 (H)  4.8 - 5.6 % Final    Estimated Average Glucose 07/02/2023 131  mg/dL Final    PRO-BNP 84/69/6295 <35.0  <=300.0 pg/mL Final    Sodium 07/02/2023 139  135 - 145 mmol/L Final    Potassium 07/02/2023 4.2  3.4 - 4.8 mmol/L Final    Chloride 07/02/2023 103  98 - 107 mmol/L Final    CO2 07/02/2023 32.0 (H)  20.0 - 31.0 mmol/L Final    Anion Gap 07/02/2023 4 (L)  5 - 14 mmol/L Final    BUN 07/02/2023 11  9 - 23 mg/dL Final    Creatinine 28/41/3244 0.59  0.55 - 1.02 mg/dL Final    BUN/Creatinine Ratio 07/02/2023 19   Final    eGFR CKD-EPI (2021) Female 07/02/2023 >90  >=60 mL/min/1.52m2 Final    Glucose 07/02/2023 96  70 - 179 mg/dL Final    Calcium 09/18/7251 10.5 (H)  8.7 - 10.4 mg/dL Final    Albumin 66/44/0347 4.6  3.4 - 5.0 g/dL Final    Total Protein 07/02/2023 8.0  5.7 - 8.2 g/dL Final    Total Bilirubin 07/02/2023 0.7  0.3 - 1.2 mg/dL Final    AST 42/59/5638 20  <=34 U/L Final    ALT 07/02/2023 17  10 - 49 U/L Final    Alkaline Phosphatase 07/02/2023 95  46 - 116 U/L Final    WBC 07/02/2023 4.5  3.6 - 11.2 10*9/L Final    RBC 07/02/2023 5.40 (H)  3.95 - 5.13 10*12/L Final    HGB 07/02/2023 16.2 (H)  11.3 - 14.9 g/dL Final    HCT 75/64/3329 47.8 (H)  34.0 - 44.0 % Final    MCV 07/02/2023 88.5  77.6 - 95.7 fL Final    MCH 07/02/2023 30.0  25.9 - 32.4 pg Final    MCHC 07/02/2023 33.9  32.0 - 36.0 g/dL Final    RDW 51/88/4166  14.2  12.2 - 15.2 % Final    MPV 07/02/2023 8.7  6.8 - 10.7 fL Final    Platelet 07/02/2023 236  150 - 450 10*9/L Final    Neutrophils % 07/02/2023 61.8  % Final    Lymphocytes % 07/02/2023 27.2  % Final    Monocytes % 07/02/2023 8.4  % Final    Eosinophils % 07/02/2023 2.2  % Final    Basophils % 07/02/2023 0.4  % Final    Absolute Neutrophils 07/02/2023 2.8  1.8 - 7.8 10*9/L Final    Absolute Lymphocytes 07/02/2023 1.2  1.1 - 3.6 10*9/L Final    Absolute Monocytes 07/02/2023 0.4  0.3 - 0.8 10*9/L Final    Absolute Eosinophils 07/02/2023 0.1  0.0 - 0.5 10*9/L Final    Absolute Basophils 07/02/2023 0.0  0.0 - 0.1 10*9/L Final    CRP 07/02/2023 <4.0  <=10.0 mg/L Final   Admission on 06/26/2023, Discharged on 06/26/2023   Component Date Value Ref Range Status    EKG Ventricular Rate 06/26/2023 99  BPM Final    EKG Atrial Rate 06/26/2023 99  BPM Final    EKG P-R Interval 06/26/2023 158  ms Final    EKG QRS Duration 06/26/2023 92  ms Final    EKG Q-T Interval 06/26/2023 354  ms Final    EKG QTC Calculation 06/26/2023 454  ms Final    EKG Calculated P Axis 06/26/2023 58  degrees Final    EKG Calculated R Axis 06/26/2023 -37  degrees Final    EKG Calculated T Axis 06/26/2023 58  degrees Final    QTC Fredericia 06/26/2023 418  ms Final    Sodium 06/26/2023 144  135 - 145 mmol/L Final    Potassium 06/26/2023 3.9  3.4 - 4.8 mmol/L Final    Chloride 06/26/2023 109 (H)  98 - 107 mmol/L Final    CO2 06/26/2023 26.1  20.0 - 31.0 mmol/L Final    Anion Gap 06/26/2023 9  5 - 14 mmol/L Final    BUN 06/26/2023 10  9 - 23 mg/dL Final    Creatinine 16/06/9603 0.49 (L)  0.55 - 1.02 mg/dL Final    BUN/Creatinine Ratio 06/26/2023 20   Final    eGFR CKD-EPI (2021) Female 06/26/2023 >90  >=60 mL/min/1.77m2 Final    Glucose 06/26/2023 182 (H)  70 - 179 mg/dL Final    Calcium 54/05/8118 9.9  8.7 - 10.4 mg/dL Final    hsTroponin I 14/78/2956 <3  <=34 ng/L Final    WBC 06/26/2023 4.9  3.6 - 11.2 10*9/L Final    RBC 06/26/2023 5.05  3.95 - 5.13 10*12/L Final    HGB 06/26/2023 14.9  11.3 - 14.9 g/dL Final    HCT 21/30/8657 44.1 (H)  34.0 - 44.0 % Final    MCV 06/26/2023 87.3  77.6 - 95.7 fL Final    MCH 06/26/2023 29.5  25.9 - 32.4 pg Final    MCHC 06/26/2023 33.8  32.0 - 36.0 g/dL Final    RDW 84/69/6295 13.9  12.2 - 15.2 % Final    MPV 06/26/2023 8.1  6.8 - 10.7 fL Final    Platelet 06/26/2023 214  150 - 450 10*9/L Final    nRBC 06/26/2023 0  <=4 /100 WBCs Final    Neutrophils % 06/26/2023 75.0  % Final    Lymphocytes % 06/26/2023 17.2  % Final    Monocytes % 06/26/2023 6.2  % Final    Eosinophils % 06/26/2023 0.9  %  Final    Basophils % 06/26/2023 0.7  % Final    Absolute Neutrophils 06/26/2023 3.7  1.8 - 7.8 10*9/L Final    Absolute Lymphocytes 06/26/2023 0.8 (L)  1.1 - 3.6 10*9/L Final    Absolute Monocytes 06/26/2023 0.3  0.3 - 0.8 10*9/L Final    Absolute Eosinophils 06/26/2023 0.0  0.0 - 0.5 10*9/L Final    Absolute Basophils 06/26/2023 0.0  0.0 - 0.1 10*9/L Final   Office Visit on 06/02/2023   Component Date Value Ref Range Status    Spec Gravity/POC 06/02/2023 1.015  1.003 - 1.030 Final    PH/POC 06/02/2023 6.0  5.0 - 9.0 Final    Leuk Esterase/POC 06/02/2023 Negative  Negative Final    Nitrite/POC 06/02/2023 Negative  Negative Final    Protein/POC 06/02/2023 Negative  Negative Final    UA Glucose/POC 06/02/2023 3+ (A)  Negative Final    Ketones, POC 06/02/2023 Negative  Negative Final    Bilirubin/POC 06/02/2023 Negative  Negative Final    Blood/POC 06/02/2023 Negative  Negative Final    Urobilinogen/POC 06/02/2023 0.2  0.2 - 1.0 mg/dL Final   Admission on 16/06/9603, Discharged on 05/27/2023   Component Date Value Ref Range Status    SARS-CoV-2 PCR 05/27/2023 Negative  Negative Final    Influenza A 05/27/2023 Negative  Negative Final    Influenza B 05/27/2023 Negative  Negative Final    RSV 05/27/2023 Negative  Negative Final   Office Visit on 05/20/2023   Component Date Value Ref Range Status    Amphetamines Screen, Ur 05/20/2023 Negative  <500 ng/mL Final    Barbiturates Screen, Ur 05/20/2023 Negative  <200 ng/mL Final    Benzodiazepines Screen, Urine 05/20/2023 Negative  <200 ng/mL Final    Cannabinoids Screen, Ur 05/20/2023 Negative  <20 ng/mL Final    Methadone Screen, Urine 05/20/2023 Negative  <300 ng/mL Final    Cocaine(Metab.)Screen, Urine 05/20/2023 Negative  <150 ng/mL Final    Opiates Screen, Ur 05/20/2023 Negative  <300 ng/mL Final    Fentanyl Screen, Ur 05/20/2023 Negative  <1.0 ng/mL Final    Oxycodone Screen, Ur 05/20/2023 Positive (A)  <100 ng/mL Final    Buprenorphine, Urine 05/20/2023 Negative  <5 ng/mL Final   Admission on 05/08/2023, Discharged on 05/08/2023   Component Date Value Ref Range Status    Glucose, POC 05/08/2023 151  70 - 179 mg/dL Final    Sodium 54/05/8118 141  135 - 145 mmol/L Final    Potassium 05/08/2023 3.3 (L)  3.4 - 4.8 mmol/L Final    Chloride 05/08/2023 105  98 - 107 mmol/L Final    CO2 05/08/2023 31.0  20.0 - 31.0 mmol/L Final    Anion Gap 05/08/2023 5  5 - 14 mmol/L Final    BUN 05/08/2023 12  9 - 23 mg/dL Final    Creatinine 14/78/2956 0.65  0.55 - 1.02 mg/dL Final    BUN/Creatinine Ratio 05/08/2023 18   Final    eGFR CKD-EPI (2021) Female 05/08/2023 >90  >=60 mL/min/1.73m2 Final    Glucose 05/08/2023 141  70 - 179 mg/dL Final    Calcium 21/30/8657 9.9  8.7 - 10.4 mg/dL Final    Albumin 84/69/6295 4.2  3.4 - 5.0 g/dL Final    Total Protein 05/08/2023 7.4  5.7 - 8.2 g/dL Final    Total Bilirubin 05/08/2023 0.7  0.3 - 1.2 mg/dL Final    AST 28/41/3244 25  <=34 U/L Final    ALT 05/08/2023 23  10 -  49 U/L Final    Alkaline Phosphatase 05/08/2023 89  46 - 116 U/L Final    Lipase 05/08/2023 57 (H)  12 - 53 U/L Final    Color, UA 05/08/2023 Light Yellow   Final    Clarity, UA 05/08/2023 Clear   Final    Specific Gravity, UA 05/08/2023 1.038 (H)  1.003 - 1.030 Final    pH, UA 05/08/2023 5.5  5.0 - 9.0 Final    Leukocyte Esterase, UA 05/08/2023 Negative  Negative Final    Nitrite, UA 05/08/2023 Negative  Negative Final    Protein, UA 05/08/2023 Negative  Negative Final    Glucose, UA 05/08/2023 >1000 mg/dL (A)  Negative Final    Ketones, UA 05/08/2023 Negative  Negative Final    Urobilinogen, UA 05/08/2023 <2.0 mg/dL  <5.4 mg/dL Final    Bilirubin, UA 05/08/2023 Negative  Negative Final    Blood, UA 05/08/2023 Negative  Negative Final    RBC, UA 05/08/2023 1  <=4 /HPF Final    WBC, UA 05/08/2023 <1  0 - 5 /HPF Final    Squam Epithel, UA 05/08/2023 <1  0 - 5 /HPF Final    Bacteria, UA 05/08/2023 None Seen  None Seen /HPF Final    Mucus, UA 05/08/2023 Rare (A)  None Seen /HPF Final    Amphetamines Screen, Ur 05/08/2023 Negative  <500 ng/mL Final    Barbiturates Screen, Ur 05/08/2023 Negative  <200 ng/mL Final    Benzodiazepines Screen, Urine 05/08/2023 Negative  <200 ng/mL Final    Cannabinoids Screen, Ur 05/08/2023 Negative  <20 ng/mL Final    Methadone Screen, Urine 05/08/2023 Negative  <300 ng/mL Final    Cocaine(Metab.)Screen, Urine 05/08/2023 Negative  <150 ng/mL Final    Opiates Screen, Ur 05/08/2023 Positive (A)  <300 ng/mL Final    Fentanyl Screen, Ur 05/08/2023 Negative  <1.0 ng/mL Final    Oxycodone Screen, Ur 05/08/2023 Positive (A)  <100 ng/mL Final    Buprenorphine, Urine 05/08/2023 Negative  <5 ng/mL Final    Salicylate Lvl 05/08/2023 <3.0  <=30.0 mg/dL Final    Acetaminophen Level 05/08/2023 <2.0  <=20.0 ug/ml Final    Alcohol, Ethyl 05/08/2023 <10  <=10 mg/dL Final    TSH 09/81/1914 1.880  0.550 - 4.780 uIU/mL Final    EKG Ventricular Rate 06/26/2023 99  BPM Final    EKG Atrial Rate 06/26/2023 99  BPM Final    EKG P-R Interval 06/26/2023 158  ms Final    EKG QRS Duration 06/26/2023 92  ms Final    EKG Q-T Interval 06/26/2023 354  ms Final    EKG QTC Calculation 06/26/2023 454  ms Final    EKG Calculated P Axis 06/26/2023 58  degrees Final    EKG Calculated R Axis 06/26/2023 -37  degrees Final    EKG Calculated T Axis 06/26/2023 58  degrees Final    QTC Fredericia 06/26/2023 418  ms Final    SARS-CoV-2 PCR 05/08/2023 Negative  Negative Final    WBC 05/08/2023 3.7  3.6 - 11.2 10*9/L Final    RBC 05/08/2023 4.84  3.95 - 5.13 10*12/L Final    HGB 05/08/2023 14.5  11.3 - 14.9 g/dL Final    HCT 78/29/5621 43.1  34.0 - 44.0 % Final    MCV 05/08/2023 89.0  77.6 - 95.7 fL Final    MCH 05/08/2023 30.0  25.9 - 32.4 pg Final    MCHC 05/08/2023 33.7  32.0 - 36.0 g/dL Final    RDW  05/08/2023 14.0  12.2 - 15.2 % Final    MPV 05/08/2023 7.9  6.8 - 10.7 fL Final    Platelet 05/08/2023 189  150 - 450 10*9/L Final    Neutrophils % 05/08/2023 49.6  % Final    Lymphocytes % 05/08/2023 35.6  % Final    Monocytes % 05/08/2023 10.5  % Final    Eosinophils % 05/08/2023 3.9  % Final    Basophils % 05/08/2023 0.4  % Final    Absolute Neutrophils 05/08/2023 1.8  1.8 - 7.8 10*9/L Final    Absolute Lymphocytes 05/08/2023 1.3  1.1 - 3.6 10*9/L Final    Absolute Monocytes 05/08/2023 0.4  0.3 - 0.8 10*9/L Final    Absolute Eosinophils 05/08/2023 0.1  0.0 - 0.5 10*9/L Final    Absolute Basophils 05/08/2023 0.0  0.0 - 0.1 10*9/L Final    Magnesium 05/08/2023 1.8  1.6 - 2.6 mg/dL Final    Glucose, POC 84/13/2440 80  70 - 179 mg/dL Final    Glucose, POC 07/13/2535 205 (H)  70 - 179 mg/dL Final    Glucose, POC 64/40/3474 193 (H)  70 - 179 mg/dL Final   Admission on 25/95/6387, Discharged on 05/03/2023   Component Date Value Ref Range Status    Salicylate Lvl 05/03/2023 <3.0  <=30.0 mg/dL Final    Acetaminophen Level 05/03/2023 2.7  <=20.0 ug/ml Final    SARS-CoV-2 PCR 05/03/2023 Negative  Negative Final    Alcohol, Ethyl 05/03/2023 <10  <=10 mg/dL Final    TSH 56/43/3295 0.889  0.550 - 4.780 uIU/mL Final    Sodium 05/03/2023 143  135 - 145 mmol/L Final    Potassium 05/03/2023 3.8  3.4 - 4.8 mmol/L Final    Chloride 05/03/2023 108 (H)  98 - 107 mmol/L Final    CO2 05/03/2023 29.5  20.0 - 31.0 mmol/L Final    Anion Gap 05/03/2023 6  5 - 14 mmol/L Final    BUN 05/03/2023 12  9 - 23 mg/dL Final    Creatinine 18/84/1660 0.70  0.55 - 1.02 mg/dL Final    BUN/Creatinine Ratio 05/03/2023 17   Final    eGFR CKD-EPI (2021) Female 05/03/2023 >90  >=60 mL/min/1.22m2 Final    Glucose 05/03/2023 126  70 - 179 mg/dL Final    Calcium 63/09/6008 9.9  8.7 - 10.4 mg/dL Final    Albumin 93/23/5573 4.3  3.4 - 5.0 g/dL Final    Total Protein 05/03/2023 6.8  5.7 - 8.2 g/dL Final    Total Bilirubin 05/03/2023 0.7  0.3 - 1.2 mg/dL Final    AST 22/10/5425 16  <=34 U/L Final    ALT 05/03/2023 16  10 - 49 U/L Final    Alkaline Phosphatase 05/03/2023 87  46 - 116 U/L Final    Amphetamines Screen, Ur 05/03/2023 Negative  <500 ng/mL Final    Barbiturates Screen, Ur 05/03/2023 Negative  <200 ng/mL Final    Benzodiazepines Screen, Urine 05/03/2023 Negative  <200 ng/mL Final    Cannabinoids Screen, Ur 05/03/2023 Negative  <20 ng/mL Final    Methadone Screen, Urine 05/03/2023 Negative  <300 ng/mL Final    Cocaine(Metab.)Screen, Urine 05/03/2023 Negative  <150 ng/mL Final    Opiates Screen, Ur 05/03/2023 Negative  <300 ng/mL Final    Fentanyl Screen, Ur 05/03/2023 Negative  <1.0 ng/mL Final    Oxycodone Screen, Ur 05/03/2023 Positive (A)  <100 ng/mL Final    Buprenorphine, Urine 05/03/2023 Negative  <5 ng/mL Final    WBC  05/03/2023 5.4  3.6 - 11.2 10*9/L Final    RBC 05/03/2023 4.82  3.95 - 5.13 10*12/L Final    HGB 05/03/2023 14.7  11.3 - 14.9 g/dL Final    HCT 29/56/2130 43.3  34.0 - 44.0 % Final    MCV 05/03/2023 89.9  77.6 - 95.7 fL Final    MCH 05/03/2023 30.5  25.9 - 32.4 pg Final    MCHC 05/03/2023 33.9  32.0 - 36.0 g/dL Final    RDW 86/57/8469 13.6  12.2 - 15.2 % Final    MPV 05/03/2023 8.4  6.8 - 10.7 fL Final    Platelet 05/03/2023 196  150 - 450 10*9/L Final    nRBC 05/03/2023 0  <=4 /100 WBCs Final    Neutrophils % 05/03/2023 65.8  % Final    Lymphocytes % 05/03/2023 24.2  % Final    Monocytes % 05/03/2023 7.2  % Final    Eosinophils % 05/03/2023 2.2  % Final    Basophils % 05/03/2023 0.6  % Final    Absolute Neutrophils 05/03/2023 3.6  1.8 - 7.8 10*9/L Final    Absolute Lymphocytes 05/03/2023 1.3  1.1 - 3.6 10*9/L Final    Absolute Monocytes 05/03/2023 0.4  0.3 - 0.8 10*9/L Final    Absolute Eosinophils 05/03/2023 0.1  0.0 - 0.5 10*9/L Final    Absolute Basophils 05/03/2023 0.0  0.0 - 0.1 10*9/L Final    Color, UA 05/03/2023 Light Yellow   Final    Clarity, UA 05/03/2023 Clear   Final    Specific Gravity, UA 05/03/2023 1.037 (H)  1.003 - 1.030 Final    pH, UA 05/03/2023 5.5  5.0 - 9.0 Final    Leukocyte Esterase, UA 05/03/2023 Negative  Negative Final    Nitrite, UA 05/03/2023 Negative  Negative Final    Protein, UA 05/03/2023 Negative  Negative Final    Glucose, UA 05/03/2023 >1000 mg/dL (A)  Negative Final    Ketones, UA 05/03/2023 Negative  Negative Final    Urobilinogen, UA 05/03/2023 <2.0 mg/dL  <6.2 mg/dL Final    Bilirubin, UA 05/03/2023 Negative  Negative Final    Blood, UA 05/03/2023 Negative  Negative Final    RBC, UA 05/03/2023 1  <=4 /HPF Final    WBC, UA 05/03/2023 <1  0 - 5 /HPF Final    Squam Epithel, UA 05/03/2023 1  0 - 5 /HPF Final    Bacteria, UA 05/03/2023 None Seen  None Seen /HPF Final    Mucus, UA 05/03/2023 Rare (A)  None Seen /HPF Final   Office Visit on 04/15/2023   Component Date Value Ref Range Status    Amphetamines Screen, Ur 04/15/2023 Negative  <500 ng/mL Final    Barbiturates Screen, Ur 04/15/2023 Negative  <200 ng/mL Final    Benzodiazepines Screen, Urine 04/15/2023 Negative  <200 ng/mL Final    Cannabinoids Screen, Ur 04/15/2023 Negative  <20 ng/mL Final    Methadone Screen, Urine 04/15/2023 Negative  <300 ng/mL Final    Cocaine(Metab.)Screen, Urine 04/15/2023 Negative  <150 ng/mL Final    Opiates Screen, Ur 04/15/2023 Negative  <300 ng/mL Final    Fentanyl Screen, Ur 04/15/2023 Negative  <1.0 ng/mL Final    Oxycodone Screen, Ur 04/15/2023 Negative  <100 ng/mL Final Buprenorphine, Urine 04/15/2023 Negative  <5 ng/mL Final    6-Monoacetylmorphine 04/15/2023 <5  <5 ng/mL Final    Morphine 04/15/2023 <25  <25 ng/mL Final    Codeine 04/15/2023 <25  <25 ng/mL Final  Hydrocodone 04/15/2023 <25  <25 ng/mL Final    Hydromorphone 04/15/2023 <25  <25 ng/mL Final    Oxycodone 04/15/2023 <25  <25 ng/mL Final    Oxymorphone 04/15/2023 <25  <25 ng/mL Final    Buprenorphine 04/15/2023 <5  <5 ng/mL Final    Norbuprenorphine 04/15/2023 <5  <5 ng/mL Final    Fentanyl 04/15/2023 <0.5  <0.5 ng/mL Final    Norfentanyl 04/15/2023 <1.0  <1.0 ng/mL Final    Interpretation 04/15/2023 NEGATIVE   Final   Office Visit on 03/11/2023   Component Date Value Ref Range Status    Quantiferon TB Gold Plus Interpret* 03/11/2023 Negative  Negative Final    Quantiferon TB NIL value 03/11/2023 0.00  IU/mL Final    Quantiferon Mitogen Minus Nil 03/11/2023 10.00  IU/mL Final    Quantiferon Antigen 1 minus Nil 03/11/2023 0.00  IU/mL Final    Quantiferon Antigen 2 minus NIL 03/11/2023 0.00  IU/mL Final    TB NIL VALUE 03/11/2023 0.00   Final    TB AG1 VALUE 03/11/2023 0.00   Final    TB AG2 VALUE 03/11/2023 0.00   Final    TB Mitogen 03/11/2023 10.00   Final   There may be more visits with results that are not included.       PROBLEM LIST:    Patient Active Problem List   Diagnosis    Adjustment disorder with mixed anxiety and depressed mood    Amaurosis fugax of right eye    Cocaine abuse with cocaine-induced mood disorder (CMS-HCC)    Colonic polyp    COPD (chronic obstructive pulmonary disease) (CMS-HCC)    Essential hypertension    Hallux rigidus of right foot    Hyperlipidemia    Hypothyroidism    Impaired cognition    MDD (major depressive disorder), recurrent episode, severe (CMS-HCC)    Obesity    Onychomycosis    Post traumatic stress disorder (PTSD)    Right sided weakness    Schizoaffective disorder, bipolar type (CMS-HCC)    Severe tobacco use disorder    Type 2 diabetes mellitus (CMS-HCC)    Healthcare maintenance    Knee pain    Allergic rhinitis    Ganglion cyst of dorsum of left wrist    Multiple sclerosis (CMS-HCC)    Chronic pain syndrome    Chronic right shoulder pain    Upper back pain, chronic    Toe injury, right, sequela    Weight loss of more than 10% body weight    Chronic, continuous use of opioids         CURRENT MEDICATIONS:    Current Outpatient Medications   Medication Sig Dispense Refill    acetaminophen (TYLENOL EXTRA STRENGTH) 500 MG tablet Take 2 tablets (1,000 mg total) by mouth three (3) times a day. 540 tablet 3    albuterol 2.5 mg /3 mL (0.083 %) nebulizer solution Inhale 1 vial (2.5 mg total) by nebulization every four (4) hours. 540 mL 0    albuterol HFA 90 mcg/actuation inhaler Inhale 2 puffs by mouth every six (6) hours as needed. 18 g 2    amantadine HCL (SYMMETREL) 100 mg capsule Take 1 capsule (100 mg total) by mouth two (2) times a day. 60 capsule 5    amlodipine (NORVASC) 10 MG tablet Take 1 tablet (10 mg total) by mouth daily. 90 tablet 3    aspirin (ECOTRIN) 81 MG tablet Take 1 tablet (81 mg total) by mouth daily. 90 tablet  3    baclofen (LIORESAL) 10 MG tablet Take 1 tablet (10 mg total) by mouth Three (3) times a day. 90 tablet 0    cetirizine (ZYRTEC) 10 MG tablet Take 1 tablet (10 mg total) by mouth daily. 90 tablet 3    chlorhexidine (PERIDEX) 0.12 % solution Swish and spit out 15 mls two (2) times a day. 2365 mL 3    cholecalciferol, vitamin D3-1,250 mcg, 50,000 unit,, 1,250 mcg (50,000 unit) capsule Take 1 capsule (1,250 mcg total) by mouth once a week. 24 capsule 0    colchicine (COLCRYS) 0.6 mg tablet Take 1 tablet (0.6 mg total) by mouth daily. 90 tablet 3    diclofenac sodium (VOLTAREN) 1 % gel Apply 2 g topically four (4) times a day. 200 g 6    dimethyl fumarate 240 mg CpDR Take 1 capsule (240 mg total) by mouth two (2) times a day. 180 capsule 1    divalproex ER (DEPAKOTE ER) 500 MG extended released 24 hr tablet Take 1 tablet (500 mg total) by mouth daily. 30 tablet 1    dulaglutide (TRULICITY) 1.5 mg/0.5 mL PnIj Inject the contents of 1 pen (1.5 mg total) under the skin every seven (7) days. 6 mL 3    DULoxetine (CYMBALTA) 60 MG capsule Take 1 capsule (60 mg total) by mouth daily. 90 capsule 3    empagliflozin (JARDIANCE) 25 mg tablet Take 1 tablet (25 mg total) by mouth daily. 90 tablet 3    ezetimibe (ZETIA) 10 mg tablet Take 1 tablet (10 mg total) by mouth daily. 90 tablet 3    fluticasone propionate (FLONASE) 50 mcg/actuation nasal spray Use 1 spray into each nostril daily. 16 g 11    ibuprofen (MOTRIN) 800 MG tablet Take 1 tablet (800 mg total) by mouth every eight (8) hours as needed for pain (TAKE WITH FOOD/MILK). 60 tablet 0    insulin glargine (LANTUS SOLOSTAR U-100 INSULIN) 100 unit/mL (3 mL) injection pen Inject 0.2 mL (20 Units total) under the skin nightly. 15 mL 0    ipratropium-albuterol (DUO-NEB) 0.5-2.5 mg/3 mL nebulizer Inhale 3 mL (contents of one nebule) by nebulization every six (6) hours as needed. 180 mL 3    levothyroxine (SYNTHROID) 100 MCG tablet Take 1 tablet (100 mcg total) by mouth daily. 90 tablet 2    lidocaine-prilocaine (EMLA) 2.5-2.5 % cream Apply topically Three (3) times a day as needed. 30 g 0    magnesium oxide (MAG-OX) 400 mg (241.3 mg elemental magnesium) tablet Take 1 tablet (400 mg total) by mouth daily. 30 tablet 11    nicotine (NICODERM CQ) 21 mg/24 hr patch Place 1 patch on the skin daily. 28 patch 2    nicotine polacrilex (NICORETTE) 4 MG gum Chew 1 piece of gum (4 mg total) by mouth every hour as directed as needed for smoking cessation. 110 each 3    ondansetron (ZOFRAN) 4 MG tablet Take 1 tablet (4 mg total) by mouth daily as needed for nausea. 30 tablet 1    oxyCODONE (ROXICODONE) 5 MG immediate release tablet Take 1 tablet (5 mg total) by mouth two (2) times a day as needed for pain (severe pain unresponsive to tylenol or ibuprofen). 10 tablet 0    polyethylene glycol (GLYCOLAX) 17 gram/dose powder Mix 17g (measure to line in cap) as directed and drink mixture daily. 510 g 0    pregabalin (LYRICA) 75 MG capsule Take 1 capsule (75 mg total) by mouth Three (3) times  a day. 270 capsule 3    QUEtiapine (SEROQUEL) 200 MG tablet Take 1 tablet (200 mg total) by mouth nightly. 90 tablet 3    tiotropium-olodaterol (STIOLTO RESPIMAT) 2.5-2.5 mcg/actuation Mist Inhale 2 puffs daily. 4 g 0    varenicline (CHANTIX) 1 mg tablet Take 1 tablet (1 mg total) by mouth daily. 30 tablet 3    blood sugar diagnostic (GLUCOSE BLOOD) Strp Test blood glucose 3 times daily. 50 strip 11    blood-glucose meter,continuous (FREESTYLE LIBRE 3 READER) Misc Use as directed to test blood sugar (Patient not taking: Reported on 07/23/2023) 1 each 0    blood-glucose sensor (FREESTYLE LIBRE 3 SENSOR) Devi Use to monitor blood glucose levels continuously. Change sensor every 14 days. (Patient not taking: Reported on 07/23/2023) 6 each 3    diaper,brief,adult,disposable Misc 1 each by Miscellaneous route daily as needed (incontinence). 80 each 10    food supplemt, lactose-reduced Liqd Take 1 supplement bottle (Ensure or Boost) three times daily when unable to eat solid food. (Patient not taking: Reported on 07/23/2023) 7110 mL 3    inhaler, assist devices (OPTICHAMBER, AEROCHAMBER, ADULT) Spcr Use as directed with inhalers 1 each 0    lancets Misc Test blood glucose 3 times daily. 100 each 11    miscellaneous medical supply Misc Bed pads for incontinence 100 each 3    naloxone (NARCAN) 4 mg nasal spray Give single spray in one nostril.  Repeat with 2nd device in other nostril every 3 min if no or minimal response until 911 arrives 2 each 11    oxyCODONE (ROXICODONE) 5 MG immediate release tablet Take 1 tablet (5 mg total) by mouth two (2) times a day as needed for pain. (Patient not taking: Reported on 07/23/2023) 60 tablet 0    pen needle, diabetic (PEN NEEDLE) 31 gauge x 5/16 (8 mm) Ndle Injection Frequency is 1 time per day 100 each 11     No current facility-administered medications for this visit.       Past Surgical Hx:    Past Surgical History:   Procedure Laterality Date    CESAREAN SECTION      FINGER SURGERY Right     ring finger reattached    FOOT SURGERY Right        Social Hx:    Social History     Socioeconomic History    Marital status: Widowed     Spouse name: None    Number of children: None    Years of education: None    Highest education level: None   Tobacco Use    Smoking status: Every Day     Current packs/day: 2.00     Average packs/day: 2.0 packs/day for 37.8 years (75.5 ttl pk-yrs)     Types: Cigarettes     Start date: 10/17/1985    Smokeless tobacco: Never   Vaping Use    Vaping status: Never Used   Substance and Sexual Activity    Alcohol use: No    Drug use: Never    Sexual activity: Not Currently   Social History Narrative    Taking care of grandchildren in [place]         PSYCHIATRIC HX:     -Current provider(s):  No psychiatric but resources for [employer] given     -Suicide attempts/SIB: Attempts: YES, per chart review 1998 jumped off bridge    -Psych Hospitalizations:  YES, multiple    -Med compliance hx: has not been on  psych meds in a long while per patient    -Fa hx suicide: not asked        SUBSTANCE ABUSE HX:     -Current using substance: NO    -Hx w/d sxs: not asked    -Sz Hx: not asked    -DT Hx:not asked        SOCIAL HX:    -Current living environment: Lives with family    -Current support(s): children and parent/s    -Violence (perp): NO    -Access to Firearms: NO       Family Hx:    Family History   Problem Relation Age of Onset    Asthma Mother     Cancer Son     Cancer Other     Breast cancer Neg Hx        ALLERGIES:    Allergies   Allergen Reactions    Grass Pollen-[place], Standard     Statins-Hmg-Coa Reductase Inhibitors      Hold statin due to elevated CK

## 2023-07-23 NOTE — Unmapped (Signed)
[facility] FAMILY MEDIC[facility] [place] POPULATION HEALTH  Care Management Progress Note    Date: 07/23/2023  Outcome:  Met with patient face-to-face    Purpose of contact:                 Barriers to Care:  N/A    Provider/Care Partner(s) to follow up on:   Requesting order for  1.[facility] [phone] fax [phone]    2.Home health aide order with [facility]     3. diabetic sensor and meter    Health Maintenance:  Health Maintenance Due   Topic Date Due    Retinal Eye Exam  04/24/2021    Colon Cancer Screening  05/22/2023         Additional Information/Plan:  CM met with the patient in the clinic, introduced herself, and explained her role at [facility]. During the session, CM gathered the patient???s current concerns and listened as the patient expressed frustration regarding her care, including recent challenges with her insurance and healthcare needs. CM actively listened and provided a supportive space for the patient to share these concerns.      Patient reported that her insurance plan recently changed from [facility] to [facility] tailored plan. Patient noted that she does not yet have an updated insurance card to enter into EPIC.Patient expressed that she is in need of a home health aide for daily assistance and voiced frustration at the delay in getting this support. Patient requested a referral to [facility], providing the contact phone number, [phone], for the referral. Additionally, she asked for an order for a diabetic sensor and meter to help monitor her blood sugar levels, as she has had difficulty managing her diabetes without these tools.    CM shared with patient that she will send provider a message in regards to referral to [facility] will be processed,order for home health aide,  and an order for the diabetic sensor and meter will be initiated. CM will also explore options for a home health aide through the patient???s new insurance plan and coordinate to ensure coverage.CM encouraged patient to reach out to her Care Manager for support the patient and scheduled a follow-up to confirm the completion of these actions, as well as to address any additional concerns that may arise.        Time Spent Per Day:  Chart review was completed prior to outreach attempt.   07/23/2023: 35    Cutler Sunday Isaac Bliss, LCSWA  Population Health  [facility] FAMILY MEDICINE 

## 2023-07-24 DIAGNOSIS — E1159 Type 2 diabetes mellitus with other circulatory complications: Principal | ICD-10-CM

## 2023-07-24 DIAGNOSIS — Z794 Long term (current) use of insulin: Principal | ICD-10-CM

## 2023-07-24 MED ORDER — TRULICITY 1.5 MG/0.5 ML SUBCUTANEOUS PEN INJECTOR
SUBCUTANEOUS | 3 refills | 84 days | Status: CP
Start: 2023-07-24 — End: 2024-07-23
  Filled 2023-07-29: qty 6, 84d supply, fill #0

## 2023-07-24 MED ORDER — TRULICITY 3 MG/0.5 ML SUBCUTANEOUS PEN INJECTOR
SUBCUTANEOUS | 0 refills | 28 days | Status: CP
Start: 2023-07-24 — End: 2023-07-24

## 2023-07-24 NOTE — Unmapped (Deleted)
We have received HH orders for patient listed above, the order was written for one or more of the following  HHA/OT/MSW however we can only initiate HH services with what would be considered a skilled need.  An example of a skill need is when an order is received for physical therapy , skilled nursing , or speech therapy depending on the needs of the patient and then add a HHA or MSW or Occupational Therapist   We can initiate HH with one of the disciplines mentioned above only .  Please have the order transcribed      ALSO PLEASE BE ADVISE [facility] IS AT CAPACTY AT THIS TIME.    Good Morning, Skilled Services means the pt requires the expertise/care from a licensed clinician; RN, PT, OT, ST. As an fyi...OT cannot be ordered initially as a service, meaning Nursing or PT would need to be ordered first or along with OT. The pt would need to have a Face to Face visit with a provider documenting the reason for ordering Home Health. As you can tell, Home Health is heavily regulated! Hope this helps, Misty Stanley

## 2023-07-24 NOTE — Unmapped (Signed)
[facility] Family Medicine  Care Management Progress Note               Purpose of contact: home health aid, pain clinic referral, medications    [facility]: referral faxed by Leonie Douglas.     Home health aide order with [facility]: Order is in, but patient will not be able to receive an aide through home health without a skilled discipline as well (PT, OT, and/or RN). SW filled out PCS form (last one was faxed about 2 months ago) and put in PCP's box to sign.      Prescription for diabetic sensor and meter: Order was sent to [facility]       Knute Neu, [place], [facility]  [facility] Health Care Manager  [facility] Family Medicine  [phone]

## 2023-07-24 NOTE — Unmapped (Signed)
We have received HH orders for patient listed above, the order was written for one or more of the following  HHA/OT/MSW however we can only initiate HH services with what would be considered a skilled need.  An example of a skill need is when an order is received for physical therapy , skilled nursing , or speech therapy depending on the needs of the patient and then add a HHA or MSW or Occupational Therapist   We can initiate HH with one of the disciplines mentioned above only .  Please have the order transcribed      ALSO PLEASE BE ADVISE [facility] IS AT CAPACTY AT THIS TIME.

## 2023-07-25 MED ORDER — NICOTINE 21 MG/24 HR DAILY TRANSDERMAL PATCH
MEDICATED_PATCH | TRANSDERMAL | 2 refills | 28 days
Start: 2023-07-25 — End: ?

## 2023-07-25 NOTE — Unmapped (Signed)
[facility] Family Medicine  Care Management Progress Note               Purpose of contact: PCS     Patient said she called [employer] and they would not confirm the fax number that PCS should be faxed to (SW sent to [facility] as they typically manage all non-managed medicaid plans). She said they will only give the information to providers.    SW called the [employer] provider support line. They do not want new referrals faxed, they want it emailed to [email or web address]    SW called patient and updated her. SW asked that she call SW if she doesn't hear from [facility] by midday next Wednesday.     Patient has SW's contact information and SW encouraged patient to call SW with any questions.    Shea Stakes, CCM  [facility] Manager  [facility]  [phone]

## 2023-07-25 NOTE — Unmapped (Signed)
[facility] Family Medicine  Care Management Progress Note               Purpose of contact: home health aid, pain clinic referral, medications     SW called patient and updated her on the following:  Diabetic supplies including a blood glucose meter kit were sent to [facility]. Patient said she does not use these - she needs a Administrator, sports and the [employer] sensor sent to [employer]. SW messaged PCP the request.   Patient had been previously referred for an in-home aid through [facility] PCS. The referral was accepted and she was scheduled for an intake, but then her medicaid switched from [facility] to [facility]. She was switched so she could have more behavioral health support, but she does not agree with the decision. SW faxed PCS form to [place] [employer] and updated patient. (Patient does not qualify for an aide through home health as you cannot receive an aide service unless you are also receiving PT, OT, or RN services.)  SW updated patient the [facility] referral has been sent, she can call to schedule next week.   SW updated patient that MyChart should not be used for urgent matters, for urgent matters she should make an appointment with any provider in the clinic if PCP is not available.     Patient has SW's contact information and SW encouraged patient to call SW with any questions.    Provider/Care Partner(s) to follow up on: Please put in order requested - see above.    Shea Stakes, CCM  [facility] Manager  [facility]  [phone]

## 2023-07-25 NOTE — Unmapped (Signed)
Addended by: Luther Hearing on: 07/24/2023 08:26 PM     Modules accepted: Orders

## 2023-07-25 NOTE — Unmapped (Addendum)
Pt had been tolerating up to 4.5 mg dose of Trulicity but then refill dose was decreased because of pharmacy availability to 1.5 mg dose. Considered increasing dose given availabilty, but given A1C 6.2 still within goal <7, will maintain at 1.5 mg dose for now.

## 2023-07-26 MED ORDER — FREESTYLE LIBRE 3 SENSOR DEVICE
3 refills | 0 days | Status: CP
Start: 2023-07-26 — End: 2024-07-25

## 2023-07-26 MED ORDER — FREESTYLE LIBRE 3 READER
Freq: Every day | 0 refills | 0 days | Status: CP
Start: 2023-07-26 — End: ?

## 2023-07-26 NOTE — Unmapped (Signed)
Addended by: Luther Hearing on: 07/26/2023 02:42 PM     Modules accepted: Orders

## 2023-07-28 DIAGNOSIS — Z794 Long term (current) use of insulin: Principal | ICD-10-CM

## 2023-07-28 DIAGNOSIS — E1159 Type 2 diabetes mellitus with other circulatory complications: Principal | ICD-10-CM

## 2023-07-29 MED ORDER — NICOTINE 21 MG/24 HR DAILY TRANSDERMAL PATCH
MEDICATED_PATCH | TRANSDERMAL | 2 refills | 28 days
Start: 2023-07-29 — End: ?

## 2023-07-29 MED FILL — NICOTINE (POLACRILEX) 4 MG GUM: ORAL | 5 days supply | Qty: 110 | Fill #1

## 2023-07-29 MED FILL — ACCU-CHEK GUIDE TEST STRIPS: 32 days supply | Qty: 100 | Fill #0

## 2023-07-29 MED FILL — CETIRIZINE 10 MG TABLET: ORAL | 30 days supply | Qty: 30 | Fill #1

## 2023-07-31 NOTE — Unmapped (Signed)
[facility] Family Medicine  Care Management Progress Note               Purpose of contact: Referrals and medications     [x]  [facility]: Patient said they did not receive the referral. SW refaxed it to [facility] [phone] fax [phone].     []  Freestyle Libre meter and sensor: Patient said [facility] said they have sent PCP paperwork but they were not allowed to put patient's name on it due to HIPAA. SW called [facility] [phone]) and the [facility] rep was not able to figure out what is going on or what form is needed.    08/01/23: SW called [facility]. SW talked with [place]; patient used a 2 month supply of Libre sensors in 7 days (patient told them when she stuck it in her arm she started bleeding so she ripped them out). [facility] Jasmine December) called the pharmacy yesterday, but the National Drug Code [facility]) for the replacement is not available. The Freestyle [place] NDC that is available is not covered by insurance for replacements, the [facility] rep is talking to her supervisor to see if they can get it covered, it may be Monday before they know. Patient was given a glucose monitor with test strips/lancets to use.    SW updated CPP [name] - patient would like to meet with her once she has her sensors to find out how she can work on it.     []  [facility]: Patient would like a referral as her [facility] Neurologist left the practice and a new one will not be assigned to her until January 2025. SW messaged PCP the request.     [x]  Incontinence supplies: Patient received the first order, but not the refill due to phone/address issues. SW gave her the number to [facility] (phone: [phone]) to call to coordinate delivery of this month's supplies.     08/01/23: Patient said she called [facility] and they do not accept [employer] medicaid. SW called [facility] (phone: [phone], fax: [phone]) - they accept [employer] Medicaid. SW faxed order and clinic notes.       Patient has SW's contact information and SW encouraged patient to call SW with any questions.    Shea Stakes, CCM  [facility] Manager  [facility]  [phone]

## 2023-08-03 DIAGNOSIS — G35 Multiple sclerosis: Principal | ICD-10-CM

## 2023-08-04 NOTE — Unmapped (Signed)
[facility] Family Medicine  Care Management Progress Note               Purpose of contact: Patient left SW a voicemail about medication needs.     SW called patient, discussed issues below.    Plan:  [x]  [facility]: SW refaxed referral to [facility] on 11/14 (phone:[phone], fax: [phone]).   [x]  [facility]: SW faxed order on 11/18 attn appt desk (phone: [phone], fax: [phone]).  []  Blood pressure medication: Patient said about a month ago she received a call from Dr. Loleta Chance who took her off her blood pressure medications (both) and said she was going to call in another one. She would like it sent to [facility]. SW messaged PCP the request.   []  Freestyle Libre meter and sensor: 08/01/23: SW called [facility]. SW talked with [place]; patient used a 2 month supply of Libre sensors in 7 days (patient told them when she stuck it in her arm she started bleeding so she ripped them out). [facility] Jasmine December) called the pharmacy yesterday, but the National Drug Code [facility]) for the replacement is not available. The Freestyle [place] NDC that is available is not covered by insurance for replacements, the [facility] rep is talking to her supervisor to see if they can get it covered, it may be after 11/18  before they know. Patient was given a glucose monitor with test strips/lancets to use   [x]  Incontinence Supplies: SW faxed order and clinic notes to [facility] on 11/14 (phone: [phone], fax: [phone]). Updated patient and gave her contact for [place].       Shea Stakes, CCM  [facility] Manager  [facility] Family Medicine  [phone]

## 2023-08-05 NOTE — Unmapped (Signed)
[facility] Family [facility]  Care Management Progress Note               Purpose of contact: Medication     Blood pressure medication:  Patient said PCP did not prescribe her a blood pressure medication. PCP said she sent prescription for amlodipine on 10/23. SW talked with [facility] and they said that on 10/25 patient was mailed a 3 months supply of amlodipine; it was mailed to [address] in [place]. It was sent with 16 other medications. SW updated patient via phone call.     Freestyle Libre:  SW called [employer] and talked with [place]. There has been no update from [facility], she checked and insurance will not cover more until 12/10.SW updated patient via phone call.     Plan:  [x]  [facility]: SW refaxed referral to [facility] on 11/14 (phone:[phone], fax: [phone]).   [x]  [facility]: SW faxed order on 11/18 attn appt desk (phone: [phone], fax: [phone]).  [x]  Blood pressure medication: Patient said PCP did not prescribe her a blood pressure medication. PCP said she sent prescription for amlodipine on 10/23. SW talked with [facility] and they said that on 10/25 patient was mailed a 3 months supply of amlodipine; it was mailed to [address] in [place]. It was sent with 16 other medications. SW updated patient via phone call.   []  Freestyle Libre meter and sensor: 08/01/23: SW called [facility]. SW talked with [place]; patient used a 2 month supply of Libre sensors in 7 days (patient told them when she stuck it in her arm she started bleeding so she ripped them out). [facility]) called the pharmacy yesterday, but the National Drug Code [facility]) for the replacement is not available. The Freestyle [place] NDC that is available is not covered by insurance for replacements, the [facility] rep is talking to her supervisor to see if they can get it covered. Patient was given a glucose monitor with test strips/lancets to use. SW called for an update on 11/19- no change. Patient is eligible for refill on 12/10  [x]  Incontinence Supplies: SW faxed order and clinic notes to [facility] on 11/14 (phone: [phone], fax: [phone]). Updated patient and gave her contact for [facility].      Patient has SW's contact information and SW encouraged patient to call SW with any questions.    Provider/Care Partner(s) to follow up on: N/A    Shea Stakes, CCM  [facility]  [facility] Family [facility]  [phone]

## 2023-08-06 DIAGNOSIS — F1190 Opioid use, unspecified, uncomplicated: Principal | ICD-10-CM

## 2023-08-06 DIAGNOSIS — Z794 Long term (current) use of insulin: Principal | ICD-10-CM

## 2023-08-06 DIAGNOSIS — E1159 Type 2 diabetes mellitus with other circulatory complications: Principal | ICD-10-CM

## 2023-08-06 DIAGNOSIS — J449 Chronic obstructive pulmonary disease, unspecified: Principal | ICD-10-CM

## 2023-08-06 MED ORDER — STIOLTO RESPIMAT 2.5 MCG-2.5 MCG/ACTUATION SOLUTION FOR INHALATION
Freq: Every day | RESPIRATORY_TRACT | 0 refills | 14 days
Start: 2023-08-06 — End: ?

## 2023-08-06 MED ORDER — POLYETHYLENE GLYCOL 3350 17 GRAM/DOSE ORAL POWDER
Freq: Every day | ORAL | 0 refills | 30 days
Start: 2023-08-06 — End: ?

## 2023-08-06 MED ORDER — LANTUS SOLOSTAR U-100 INSULIN 100 UNIT/ML (3 ML) SUBCUTANEOUS PEN
Freq: Every evening | SUBCUTANEOUS | 0 refills | 75 days
Start: 2023-08-06 — End: ?

## 2023-08-08 MED ORDER — STIOLTO RESPIMAT 2.5 MCG-2.5 MCG/ACTUATION SOLUTION FOR INHALATION
Freq: Every day | RESPIRATORY_TRACT | 0 refills | 14 days
Start: 2023-08-08 — End: ?

## 2023-08-08 MED ORDER — POLYETHYLENE GLYCOL 3350 17 GRAM/DOSE ORAL POWDER
Freq: Every day | ORAL | 0 refills | 30 days
Start: 2023-08-08 — End: ?

## 2023-08-08 MED ORDER — LANTUS SOLOSTAR U-100 INSULIN 100 UNIT/ML (3 ML) SUBCUTANEOUS PEN
Freq: Every evening | SUBCUTANEOUS | 0 refills | 75 days
Start: 2023-08-08 — End: ?

## 2023-08-11 MED FILL — VENTOLIN HFA 90 MCG/ACTUATION AEROSOL INHALER: RESPIRATORY_TRACT | 25 days supply | Qty: 18 | Fill #0

## 2023-08-11 NOTE — Unmapped (Signed)
Would like to speak to a Child psychotherapist. [phone]

## 2023-08-11 NOTE — Unmapped (Signed)
[facility] FAMILY MEDICINE [place] POPULATION HEALTH  Care Management Progress Note    Date: 08/11/2023  Outcome:  Phone outreach completed    Purpose of contact:           Covering SW called patient after she reached out requesting a call. SW spoke with patient briefly. Patient stated that she was in pain. Patient was agreeable to scheduling an appointment with another provider as she could not see her PCP until January. Before she could be scheduled patient asked if SW could call back. SW attempted to call back and could not reach patient.    Additional Information/Plan:  Patient provided my direct contact information and encouraged to contact me should additional needs arise.    Time Spent Per Day:  Chart review was completed prior to outreach attempt.   08/11/2023: 10    Mahlon Gammon, LCSW  Population Health  [facility] FAMILY MEDICINE [place]

## 2023-08-12 ENCOUNTER — Ambulatory Visit: Admit: 2023-08-12 | Discharge: 2023-08-13 | Payer: PRIVATE HEALTH INSURANCE

## 2023-08-12 MED ORDER — IBUPROFEN 800 MG TABLET
ORAL_TABLET | Freq: Three times a day (TID) | ORAL | 2 refills | 20 days | Status: CP | PRN
Start: 2023-08-12 — End: ?
  Filled 2023-08-12: qty 60, 20d supply, fill #0

## 2023-08-12 MED ORDER — MICONAZOLE NITRATE 100 MG VAGINAL SUPPOSITORY
Freq: Every day | VAGINAL | 0 refills | 7 days | Status: CP
Start: 2023-08-12 — End: 2023-08-19

## 2023-08-12 NOTE — Unmapped (Signed)
S:  Tonya Wood is a 57 y.o. female, with h/o T2DM, chronic pain, presenting to clinic for:     ##  health issues / updates/ prevention  requesting med refills - Ibuprofen 800mg  (last RF 07/07/23)  Thinks she has BV again (sxs started yesterday) - she gets them 2x/ year  Due for diabetic eye exam  Wants a new PCP      Allergies, Medications, Past Medical, Surgical, Family, and Social History  I personally reviewed patient's allergies, meds, past medical, surgical, family, and social histories.    Review of Systems    Multi-point ROS was performed and was negative, unless mentioned above in the HPI.      O:    Vitals: BP 103/69 (BP Position: Sitting)  - Pulse 72  - Temp 36.3 ??C (97.3 ??F)  - Ht 170.2 cm (5' 7)  - Wt 85 kg (187 lb 6.4 oz)  - BMI 29.35 kg/m??   General Apperance: comfortable and no acute distress  Eyes: pink conjunctivae, anicteric sclerae  ENT / Mouth: ears and nose without external deformities, hearing grossly intact  Neck: neck symmetric, trachea midline  Respiratory: normal respiratory effort and clear to auscultation bilaterally  Cardiovascular: Pulse-normal rate, regularity and rhythm, S1, S2 normal w/o murmur, rub, or gallop and no edema  Gastrointestinal: abdomen soft, non-tender and non-distended, no masses and no noticeable hernia  Lymphatic: no cervical adenopathy  Musculoskeletal: normal gait and station and no clubbing, cyanosis, effusion  Skin: no rashes or significant lesions  Neurologic: moves all extremities well and no involuntary movements  Psychiatric: normal mood, appropriate affect     Labs/tests:   Results for orders placed or performed in visit on 08/12/23   Wet Prep, Genital    Specimen: Vagina; Swab   Result Value Ref Range    Bacterial Vaginitis Negative Negative    Yeast Screen Budding Yeast Present (A) No Budding Yeast         A/P:  Tonya Wood is a 57 y.o. female, with h/o T2DM, chronic pain, here for:      ##   T2DM  Retinal eye exam referral    ##   chronic pain syndrome  Told patient I cannot refill oxycodone early  Will get her established with new PCP  Ibuprofen refilled  Continue Lyrica and Cymbalta    ##   vaginal candidiasis  Wet prep showed budding yeast  Given multiple potential interaction with systemic fluconazole, we will try topical therapy for this - miconazole vaginal cream/suppository sent    RTC 2-4 wks    Time-based coding:  Total time spent caring for the patient on the day of the encounter was 30 minutes. That includes chart review before the visit, the actual patient visit, time spent on documentation, discussing patient's care w/ other health professionals or family members, corresponding with the patient, ordering medications, studies, procedures, or referrals after the visit. Total time does not include time spent on surgical procedure(s).   ________________________________________

## 2023-08-12 NOTE — Unmapped (Signed)
[facility] Family Medicine  Care Management Progress Note               Purpose of contact: SW followed up on patient request to assist with pain clinic referral.    Pain clinic appointment: Patient called SW with concerns she doesn't have a pain clinic appointment scheduled - SW let her know she is scheduled with [facility] for 12/10.    Incontinence supplies: Patient said Cedric Fishman has not received completed paperwork from PCP. SW called [facility] - they emailed paperwork to SW, SW put in [employer]. Messaged PCP.     Plan:  [x]  Pain Clinic: SW refaxed referral to [facility] on 11/14 (phone:[phone], fax: [phone]). Patient is scheduled with [facility] for 12/10.   [x]  [facility]: SW faxed order on 11/18 attn appt desk (phone: [phone], fax: [phone]).  []  Freestyle Libre meter and sensor: Patient used a 2 month supply of Libre sensors in 7 days (patient told them when she stuck it in her arm she started bleeding so she ripped them out). [facility]) called the pharmacy yesterday, but the National Drug Code [facility]) for the replacement is not available. Patient was given a glucose monitor with test strips/lancets to use. SW called for an update on 11/19- no change. Patient is eligible for refill on 12/10  [x]  Incontinence Supplies: SW faxed order and clinic notes to [facility] on 11/14 (phone: [phone], fax: [phone]). Put paperwork to complete in PCP's box on 11/26.      Shea Stakes, CCM  [facility] Manager  [facility] Family Medicine  [phone]

## 2023-08-13 NOTE — Unmapped (Signed)
[facility] [employer] form faxed to [phone]

## 2023-08-15 MED ORDER — TRULICITY 4.5 MG/0.5 ML SUBCUTANEOUS PEN INJECTOR
SUBCUTANEOUS | 11 refills | 28 days | Status: CP
Start: 2023-08-15 — End: 2023-07-24

## 2023-08-19 ENCOUNTER — Institutional Professional Consult (permissible substitution): Admit: 2023-08-19 | Payer: PRIVATE HEALTH INSURANCE

## 2023-08-19 MED ORDER — DIVALPROEX ER 500 MG TABLET,EXTENDED RELEASE 24 HR
ORAL_TABLET | Freq: Every day | ORAL | 1 refills | 30 days | Status: CP
Start: 2023-08-19 — End: 2024-08-18

## 2023-08-19 MED ORDER — COLCHICINE 0.6 MG TABLET
ORAL_TABLET | Freq: Every day | ORAL | 3 refills | 90 days | Status: CP
Start: 2023-08-19 — End: 2024-08-18

## 2023-08-19 MED ORDER — CETIRIZINE 10 MG TABLET
ORAL_TABLET | Freq: Every day | ORAL | 3 refills | 90 days | Status: CP
Start: 2023-08-19 — End: 2024-08-18

## 2023-08-19 MED ORDER — STIOLTO RESPIMAT 2.5 MCG-2.5 MCG/ACTUATION SOLUTION FOR INHALATION
Freq: Every day | RESPIRATORY_TRACT | 0 refills | 14 days | Status: CP
Start: 2023-08-19 — End: ?

## 2023-08-19 NOTE — Unmapped (Signed)
[facility] SUBJECT FOR NEW MSG: Medication Request Patient Name: Tonya Wood   Caller: Self (Patient)  Name of Caller: Tonya Wood   Have you contacted your pharmacy? yes      Last seen in-person: 08/12/2023  Last telemedicine visit: 08/21/2022        Medication Name: oxyCODONE (ROXICODONE) 5 MG immediate release tablet   Dosage:5 MG  Route: Oral (PO)  Frequency: Twice a day (BID)  Day Supply Requested: 10  Pharmacy (Name & Address):   [facility]  [address] [place] [address]      Pharmacy Phone Number: [phone]   Are there refills on medications? no  Has [facility] refilled this medication before? No; The previous prescribing provider information is listed below:      oxyCODONE (ROXICODONE) 5 MG immediate release tablet [[id number]]    Order Details  Dose: 5 mg Route: Oral Frequency: 2 times a day PRN for pain, severe pain unresponsive to   Dispense Quantity: 10 tablet Refills: 0 Fills remaining: 0         Sig: Take 1 tablet (5 mg total) by mouth two (2) times a day as needed for pain (severe pain unresponsive to tylenol or ibuprofen).         Start Date: 07/07/23 End Date: --   Written Date: 07/07/23 Expiration Date: 01/03/24   Earliest Fill Date: 07/07/23         Associated Diagnoses: Chronic bilateral low back pain without sciatica [M54.50, G89.29]       The patient inquires about the possibility of refilling her medication, she states she has made multiple attempts to contact her doctor. She acknowledges that there are no refills remaining and requests a callback, as she has questions for her primary care physician.

## 2023-08-20 ENCOUNTER — Ambulatory Visit
Admit: 2023-08-20 | Discharge: 2023-08-21 | Payer: PRIVATE HEALTH INSURANCE | Attending: Student in an Organized Health Care Education/Training Program | Primary: Student in an Organized Health Care Education/Training Program

## 2023-08-20 DIAGNOSIS — M545 Chronic bilateral low back pain without sciatica: Principal | ICD-10-CM

## 2023-08-20 DIAGNOSIS — G8929 Other chronic pain: Principal | ICD-10-CM

## 2023-08-20 LAB — TOXICOLOGY SCREEN, URINE
AMPHETAMINE SCREEN URINE: NEGATIVE
BARBITURATE SCREEN URINE: NEGATIVE
BENZODIAZEPINE SCREEN, URINE: NEGATIVE
BUPRENORPHINE, URINE SCREEN: NEGATIVE
CANNABINOID SCREEN URINE: NEGATIVE
COCAINE(METAB.)SCREEN, URINE: NEGATIVE
FENTANYL SCREEN, URINE: NEGATIVE
METHADONE SCREEN, URINE: NEGATIVE
OPIATE SCREEN URINE: NEGATIVE
OXYCODONE SCREEN URINE: NEGATIVE

## 2023-08-20 MED ORDER — OXYCODONE 5 MG TABLET
ORAL_TABLET | Freq: Three times a day (TID) | ORAL | 0 refills | 5 days | Status: CP | PRN
Start: 2023-08-20 — End: 2023-08-25
  Filled 2023-08-20: qty 15, 5d supply, fill #0

## 2023-08-20 MED ORDER — AZITHROMYCIN 250 MG TABLET
ORAL_TABLET | ORAL | 0 refills | 5 days | Status: CP
Start: 2023-08-20 — End: 2023-08-25
  Filled 2023-08-20: qty 6, 5d supply, fill #0

## 2023-08-20 MED ORDER — STIOLTO RESPIMAT 2.5 MCG-2.5 MCG/ACTUATION SOLUTION FOR INHALATION
Freq: Every day | RESPIRATORY_TRACT | 3 refills | 14 days | Status: CP
Start: 2023-08-20 — End: ?
  Filled 2023-08-20: qty 4, 30d supply, fill #0

## 2023-08-20 MED ORDER — FLUCONAZOLE 150 MG TABLET
ORAL_TABLET | ORAL | 0 refills | 6 days | Status: CP
Start: 2023-08-20 — End: 2023-08-26
  Filled 2023-08-20: qty 2, 6d supply, fill #0

## 2023-08-20 NOTE — Unmapped (Signed)
[facility] Encounter  This medical encounter was conducted virtually using [email or web address]  TeleHealth protocols.    Patient ID: Tonya Wood is a 57 y.o. female who presents by telephone interaction for evaluation.    I have identified myself to the patient and conveyed my credentials to Tonya Wood.   Patient has signed informed consent on file in medical record.    Present on Phone Call: Is there someone else in the room? No..    Assessment/Plan:    Diagnoses and all orders for this visit:    Schizoaffective disorder, bipolar type (CMS-HCC)    Chronic obstructive pulmonary disease, unspecified COPD type (CMS-HCC)  -     tiotropium-olodaterol (STIOLTO RESPIMAT) 2.5-2.5 mcg/actuation Mist; Inhale 2 puffs daily.    Gout, unspecified cause, unspecified chronicity, unspecified site  -     colchicine (COLCRYS) 0.6 mg tablet; Take 1 tablet (0.6 mg total) by mouth daily.    Allergic rhinitis, unspecified seasonality, unspecified trigger  -     cetirizine (ZYRTEC) 10 MG tablet; Take 1 tablet (10 mg total) by mouth daily.     Keep PCP follow up.     -- Discussed the new prescription noted above, including potential side effects, drug interactions, instructions for taking the medication, and the consequences of not taking it.  -- Patient verbalized an understanding of today's assessment and recommendations, as well as the purpose of ongoing medications.    Follow-up as Needed     Medication adherence and barriers to the treatment plan have been addressed. Opportunities to optimize healthy behaviors have been discussed. Patient / caregiver voiced understanding.              Subjective:     HPI  Tonya Wood is 57 y.o. and presents today in the [facility] requesting a refill on medications while they await an appointment with their PCP.  The PCP for this patient is Wood, Tonya Manuel, MD.  Pt refill needed on chronic medications.  NO new issues tonight.  Awaiting new PCP appt as her PCP left the practice.        ROS  Review of Systems     All other ROS per HPI.    I have reviewed the problem list, past medical history, past family history, medications, and allergies and have updated/reconciled them if needed.            Objective:   This visit was not converted from a video visit to a telephone visit for the following reason: Patient preference    As part of this Telephone Visit, no in-person exam was conducted.           The patient reports they are physically located in [place] and is currently: at home. I conducted a phone visit.  I spent 9 minutes on the phone call with the patient on the date of service .

## 2023-08-20 NOTE — Unmapped (Signed)
PDMP reviewed and appropriate.   Pt has extensive chronic pain (neck, back, shoulders, knees iso MS, also with severe dental pain and inability to eat due to dental pain, pending extractions) for which she is prescribed oxycodone 5 mg BID prn #60. Pt has been taking 3-4 tabs per day, which is more than typically needs.    Pt also reports taking 5 tablets of ibuprofen on an empty stomach, which she says she typically does not do (takes tylenol). Counseled to patient that this is not safe and dangerous for her stomach.    Unclear if this is acute pain exacerbation or just chronic pain. Pt fortunately has a visit with a provider today and they can review safe pain med usage at that visit.    At this time I will refill chronic pain meds oxycodone 5 mg BID PRN.    It is okay to also fill additional #10 tablets of oxycodone at this time if the patient requires more for acute pain (e.g. for dental pain prior to being pulled).    Current pain regimen:  - Oxycodone 5 mg BID prn  - Pregabalin (Lyrica) 75 mg TID - can increase in increments of 25 to 150 mg/day weekly up 300 to 600 mg/day in 2 to 3 divided doses. Increasing may worsen COPD.  - Duloxetine (cymbalta) 60 mg daily - can go up to 120 mg/day but may not have benefit  - Cyclobenzaprine (Flexeril) 5 mg TID  - Tylenol 1000 mg TID  - (Not taking) Ibuprofen 600 mg TID - encourage patient to take max 800 mg TID with food/milk.    Luther Hearing MD, MPH  [facility] Family Medicine PGY3  (She/her/hers)

## 2023-08-20 NOTE — Unmapped (Signed)
[facility] SUBJECT FOR NEW MSG: Medication Request Patient Name: Tonya Wood   Caller: Self (Patient)  Name of Caller:Tonya Wood  Have you contacted your pharmacy? yes      Last seen in-person: 08/12/2023  Last telemedicine visit: 08/21/2022        Medication Name: oxyCODONE (ROXICODONE)   Dosage:immediate release tablet   Route: Oral (PO)  Frequency: As Needed (PRN)  Day Supply Requested: 30  Pharmacy (Name & Address):   [facility]  [address], [place] [address]          Pharmacy Phone Number: [phone]    Are there refills on medications? no  Has [facility] refilled this medication before? Yes

## 2023-08-20 NOTE — Unmapped (Signed)
[facility] [employer] from faxed to [phone]

## 2023-08-20 NOTE — Unmapped (Signed)
Addended by: Eliberto Ivory on: 08/19/2023 06:53 PM     Modules accepted: Level of Service

## 2023-08-20 NOTE — Unmapped (Signed)
[employer] form faxed to [phone]

## 2023-08-20 NOTE — Unmapped (Signed)
[facility]  Established Patient Clinic Note    Assessment & Plan  Tonya Wood is a 57 y.o.female    Assessment & Plan  Chronic obstructive pulmonary disease, unspecified COPD type (CMS-HCC)  Exam concerning for COPDe likely due to viral illness. Recently spent time with family for Thanksgiving including young children who had the sniffles.   Plan: azithromycin for 5 days and refilled Stiolto inhaler   Orders:    tiotropium-olodaterol (STIOLTO RESPIMAT) 2.5-2.5 mcg/actuation Mist; Inhale 2 puffs daily.    azithromycin (ZITHROMAX) 250 MG tablet; Take 2 tablets (500 mg total) by mouth daily for 1 day, THEN 1 tablet (250 mg total) daily for 4 days.    Chronic pain syndrome  Sources of pain: cervical spine, thoracic spine, lumbar spine, right shoulder, knees, dental in the setting of multiple sclerosis   Prior Tx: oxycodone 5mg  BID   Current Tx: oxycodon 5mg  BID   Multimodal: acetaminophen 1000mg  TID, ibuprofen 600mg  TID PRN with food, cyclobenzaprine 5mg  TID, duloxetine 60mg  daily, pregabalin 75mg  TID  Bowel Regimen: Miralax daily     PDMP reviewed. Last PDMP Review: 08/20/2023  4:30 PM  Obtain Utox. Prior Utox was appropriate.     Pain Management: Not currently. Previously with [facility]. Waiting to establish with [facility].     Plan: Prior PCP refilled oxycodone 5mg  BID. Given acute on chronic pain, I provided additional oxycodone 5mg  PRN for breakthrough pain for 5 days. Scheduled to establish with a new PCP on 12/16. Counseled on appropriate use of ibuprofen, not on an empty stomach; patient verbalized understanding.     Orders:    Toxicology Screen, Urine; Future    oxyCODONE (ROXICODONE) 5 MG immediate release tablet; Take 1 tablet (5 mg total) by mouth every eight (8) hours as needed for pain (Breakthrough pain) for up to 5 days.    Yeast infection  Wet Prep from 11/26 indicated yeast infection  Plan: Rx for fluconazole 2 tablets   Orders:    fluconazole (DIFLUCAN) 150 MG tablet; Take 1 tablet (150 mg total) by mouth every third day for 2 doses.    Complex care coordination  I messaged the assigned Care Manager who was offsite on 12/4 and was connected with the onsite Care Manager. CM attempted to scheduled with [facility]; however, only able to schedule with a particular provider that the patient has not found helpful previously.     Assigned Care Manager indicated the following as ongoing care items:   - Pain Clinic: referral has been faxed to [facility].   - Neurology: referral faxed to [facility]   - Incontinence Supplies: order faxed to [facility]    Updates from patient:   - Received incontinence supplies   - Really wants to get her pain under control   - Would like a provider who is responsive fairly quickly as has had issues getting meds and supplies before running out     I requested the following from CM:   - Assistance with finding a local Pain Clinic who would accept the patient's insurance   - Clarification if insurance will cover [facility]; happy to place the referral vs PCS       Dental caries noted on examination  Awaiting total extraction of all teeth   A significant source of pain. No signs of infection.            Subjective   Tonya Wood is a 57  y.o. female coming to clinic today for the following issues: Medication Refill (Referall to pain clinic)    History of Present Illness:    Medication Refill: Prior PCP sent in oxycodone prescription. Prior referral sent to [facility], but told a 6 month wait. Supposed to be getting HH. Insurance did switch so not sure if that is causing an issue. Has a CM from [facility], forgot her name yet the number is [phone]. Family is worried that may need ALF.   She took 5 800mg  of Motrin in order to get up and moving, specifically make it to today's appt.    She just got her diapers delivered this past Saturday.   Needs: Pain Management as soon as possible   Not sure what is driving her increased pain. Has a lot of imaging and not getting answers. It was recommended ot have L knee replacement, but she does not want to have a surgery and then have more pain afterward. She has MS.   She has her other medications including pregabalin, duloxetine, cyclobenzaprine, tylenol. Only takes iburpofen if still having pain. She couldn't find her Tylenol. Currently, without pain after taking 5 800mg  tablets of Motrin.    Having difficulty eating due to dental pain, waiting for teeth to be pulled. Insurance will not cover Ensure.     Couhging up phlegm for 2 days, gross color.   Husband died 4 years ago.   Asking about lab results, believe yeast infection and did not receive tx for that.    25 grandkids. 15 were at the home for Thanksgiving. Lives with her mother.     ROS:    Review of Systems   Constitutional:  Negative for chills and fever.   Respiratory:  Positive for cough and sputum production.      I have reviewed past medical history, past surgical history, family history, social history, allergies, health maintenance/care gaps, medications, and problem list, and have updated/reconciled them as indicated.     Objective   BP 113/74 (BP Site: L Arm, BP Position: Sitting, BP Cuff Size: Medium)  - Pulse 91     Physical Exam  Constitutional:       Appearance: Normal appearance.   HENT:      Head: Normocephalic and atraumatic.      Right Ear: External ear normal.      Left Ear: External ear normal.      Nose: Nose normal.      Mouth/Throat:      Mouth: Mucous membranes are moist.   Eyes:      Conjunctiva/sclera: Conjunctivae normal.   Cardiovascular:      Rate and Rhythm: Normal rate and regular rhythm.      Pulses: Normal pulses.      Heart sounds: Normal heart sounds.   Pulmonary:      Effort: Pulmonary effort is normal. No respiratory distress.      Breath sounds: Wheezing and rhonchi present. No rales.   Abdominal:      General: Bowel sounds are normal.      Palpations: Abdomen is soft.   Musculoskeletal:      Comments: Ambulating with a rollator   Neurological:      General: No focal deficit present.      Mental Status: She is alert. Mental status is at baseline.       Labs & Imaging: Reviewed pertinent labs and imaging in Epic; see Assessment & Plan.   Procedure(s): No  [facility] [place] [facility] [employer] [place] [facility]  [address] [place], [place] [address]   Telephone [phone]  Fax [phone]  [email or web address]

## 2023-08-21 NOTE — Unmapped (Addendum)
 Exam concerning for COPDe likely due to viral illness. Recently spent time with family for Thanksgiving including young children who had the sniffles.   Plan: azithromycin for 5 days and refilled Stiolto inhaler   Orders:    tiotropium-olodaterol (STIOLTO RESPIMAT) 2.5-2.5 mcg/actuation Mist; Inhale 2 puffs daily.    azithromycin (ZITHROMAX) 250 MG tablet; Take 2 tablets (500 mg total) by mouth daily for 1 day, THEN 1 tablet (250 mg total) daily for 4 days.

## 2023-08-21 NOTE — Unmapped (Signed)
[facility] SUBJECT FOR NEW MSG: Medication Request Patient Name: Tonya Wood   Caller: Self (Patient)  Name of Caller:Drema Devor   Have you contacted your pharmacy? yes      Last seen in-person: 08/20/2023  Last telemedicine visit: 08/21/2022        Medication Name: oxyCODONE (ROXICODONE) 5 MG immediate release tablet   Dosage:5MG   Route: Oral (PO)  Frequency: Twice a day (BID)  Day Supply Requested: 72  Pharmacy (Name & Address): [facility]    Pharmacy Phone Number: [phone]   Are there refills on medications? no  Has [facility] refilled this medication before? Yes     Patient said the prescription was sent to the wrong pharmacy and has continued to be sent to the wrong pharmacy. They were told that the home delivery pharmacy is unable to send the medication and requested it go through [facility]

## 2023-08-22 DIAGNOSIS — N3946 Mixed incontinence: Principal | ICD-10-CM

## 2023-08-22 DIAGNOSIS — G894 Chronic pain syndrome: Principal | ICD-10-CM

## 2023-08-22 DIAGNOSIS — G35 Multiple sclerosis: Principal | ICD-10-CM

## 2023-08-22 DIAGNOSIS — Z7409 Other reduced mobility: Principal | ICD-10-CM

## 2023-08-22 MED ORDER — OXYCODONE 5 MG TABLET
ORAL_TABLET | Freq: Two times a day (BID) | ORAL | 0 refills | 30 days | Status: CP | PRN
Start: 2023-08-22 — End: ?
  Filled 2023-08-23: qty 60, 30d supply, fill #0

## 2023-08-22 NOTE — Unmapped (Signed)
[facility]  Care Management Progress Note               Purpose of contact: Pain management and in-home assistance    Pain Management: SW called [facility] in [place] (phone #[phone]; fax #[phone]); their earliest openings are in March. Patient would like SW to send referral to [facility]. SW faxed referral.    Patient said [facility] called her and said they will call her in 6-8 weeks to schedule.    Home Health: Patient said she would like to be referred for home health. SW messaged Dr. Pernell Dupre the request.     Patient said [facility] called about PCS and someone will call her soon about setting up an intake.     Provider/Care Partner(s) to follow up on: Patient requesting home health.    Shea Stakes, CCM  [facility] Manager  [facility]  [phone]

## 2023-08-22 NOTE — Unmapped (Signed)
Dr. Pernell Dupre reviewed PDMP. Resent oxycodone 5mg  BID PRN for pain to the [facility].     Landry Dyke, MD  Career Development Fellow   [facility] Medicine [facility]

## 2023-08-22 NOTE — Unmapped (Signed)
Sources of pain: cervical spine, thoracic spine, lumbar spine, right shoulder, knees, dental in the setting of multiple sclerosis   Prior Tx: oxycodone 5mg  BID   Current Tx: oxycodon 5mg  BID   Multimodal: acetaminophen 1000mg  TID, ibuprofen 600mg  TID PRN with food, cyclobenzaprine 5mg  TID, duloxetine 60mg  daily, pregabalin 75mg  TID  Bowel Regimen: Miralax daily     PDMP reviewed. Last PDMP Review: 08/20/2023  4:30 PM  Obtain Utox. Prior Utox was appropriate.     Pain Management: Not currently. Previously with [facility]. Waiting to establish with [facility].     Plan: Prior PCP refilled oxycodone 5mg  BID. Given acute on chronic pain, I provided additional oxycodone 5mg  PRN for breakthrough pain for 5 days. Scheduled to establish with a new PCP on 12/16. Counseled on appropriate use of ibuprofen, not on an empty stomach; patient verbalized understanding.     Orders:    Toxicology Screen, Urine; Future    oxyCODONE (ROXICODONE) 5 MG immediate release tablet; Take 1 tablet (5 mg total) by mouth every eight (8) hours as needed for pain (Breakthrough pain) for up to 5 days.

## 2023-08-22 NOTE — Unmapped (Signed)
I messaged the assigned Care Manager who was offsite on 12/4 and was connected with the onsite Care Manager. CM attempted to scheduled with [facility]; however, only able to schedule with a particular provider that the patient has not found helpful previously.     Assigned Care Manager indicated the following as ongoing care items:   - Pain Clinic: referral has been faxed to [facility].   - Neurology: referral faxed to [facility]   - Incontinence Supplies: order faxed to [facility]    Updates from patient:   - Received incontinence supplies   - Really wants to get her pain under control   - Would like a provider who is responsive fairly quickly as has had issues getting meds and supplies before running out     I requested the following from CM:   - Assistance with finding a local Pain Clinic who would accept the patient's insurance   - Clarification if insurance will cover [facility]; happy to place the referral vs PCS

## 2023-08-25 DIAGNOSIS — E1159 Type 2 diabetes mellitus with other circulatory complications: Principal | ICD-10-CM

## 2023-08-25 DIAGNOSIS — Z794 Long term (current) use of insulin: Principal | ICD-10-CM

## 2023-08-25 MED ORDER — LANTUS SOLOSTAR U-100 INSULIN 100 UNIT/ML (3 ML) SUBCUTANEOUS PEN
Freq: Every evening | SUBCUTANEOUS | 0 refills | 75.00 days
Start: 2023-08-25 — End: ?

## 2023-08-26 ENCOUNTER — Encounter: Admit: 2023-08-26 | Payer: PRIVATE HEALTH INSURANCE

## 2023-08-26 ENCOUNTER — Encounter: Admit: 2023-08-26 | Discharge: 2023-09-24 | Payer: PRIVATE HEALTH INSURANCE

## 2023-08-26 ENCOUNTER — Inpatient Hospital Stay: Admit: 2023-08-26 | Payer: PRIVATE HEALTH INSURANCE

## 2023-08-27 DIAGNOSIS — E1159 Type 2 diabetes mellitus with other circulatory complications: Principal | ICD-10-CM

## 2023-08-27 DIAGNOSIS — Z794 Long term (current) use of insulin: Principal | ICD-10-CM

## 2023-08-27 MED ORDER — LANTUS SOLOSTAR U-100 INSULIN 100 UNIT/ML (3 ML) SUBCUTANEOUS PEN
Freq: Every evening | SUBCUTANEOUS | 2 refills | 75.00 days | Status: CP
Start: 2023-08-27 — End: 2023-08-27

## 2023-08-27 NOTE — Unmapped (Signed)
[facility]  Care Management Progress Note               Purpose of contact: HCPOA and assisted living    SW received a message from the home health team - patient is requesting assisted living at a specific facility in [place]. SW completed FL2 and put in PCP [place]'s box.    Patient would like to complete HCPOA. SW will meet with her during 12/16 visit to give her the paperwork [facility] does not have a notary onsite, patient will need to notarize documents at a post office or bank). If patient does not attend the visit, SW will mail.    Home health team noted that patient is in contact with her [facility] case manager about PCS.     Shea Stakes, CCM  [facility] Manager  [facility]  [phone]

## 2023-08-27 NOTE — Unmapped (Signed)
[facility] SUBJECT FOR NEW MSG: Medication Request Patient Name: Tonya Wood   Caller: Self (Patient)  Name of Caller:  Have you contacted your pharmacy? yes      Last seen in-person: 08/20/2023  Last telemedicine visit: Visit date not found        Medication Name: insulin glargine (LANTUS SOLOSTAR U-100 INSULIN)   Dosage:100 unit/mL (3 mL) injection pen   Route: Injection (IM/SUBQ)  Frequency: Nightly  Day Supply Requested: 15mL  Pharmacy (Name & Address): [facility]    Pharmacy Phone Number: [phone]   Are there refills on medications? no  Has [facility] refilled this medication before? Yes     Patient would like to have their medication filled urgently so they don't miss a dosage and would like a call once the prescription is placed

## 2023-08-27 NOTE — Unmapped (Signed)
[facility] SUBJECT FOR NEW MSG: Prior Authorization for Meds Patient Name: Tonya Wood   Caller: Self (Patient)    Name of Insurance Company: [place] [employer]   Name of Caller: [employer] Phone Number: [phone]  Member ID Number: [id number]     Is this a PA initiation request? yes  Is this a PA status update? no  If yes, what is the status? NA    Medication: blood-glucose sensor (FREESTYLE LIBRE 3 PLUS SENSOR) Devi   Has this medication received a prior auth in the past?: no  Associated Diagnosis (Reason for Medication): Type 2 diabetes mellitus with hyperglycemia, with long term current use of insulin  Preferred Pharmacy: [facility]  [phone]     Your insurance company will require [place] to list other medications you have tried for this same problem. Have you ever tried other medications for this same issue? NA  If so, what were they and why did you stop them? NA

## 2023-08-28 NOTE — Unmapped (Signed)
PA for freestyle libre started 12/11 via epic.

## 2023-08-28 NOTE — Unmapped (Signed)
Tonya Wood has been contacted in regards to their refill of dimethyl fumarate 240 mg Cpdr. At this time, they have declined refill due to  had  meds  transferred to  [facility]  pharmacy  . Refill assessment call date has been updated per the patient's request.

## 2023-08-28 NOTE — Unmapped (Signed)
RPM services declined due to short term admission and patient stating she did not currently need.

## 2023-08-28 NOTE — Unmapped (Signed)
[facility]  Care Management Progress Note               Purpose of contact: SW followed up on patient request to assist with referrals.    Patient said she needs a form sent from PCP to [facility] company. She is not sure what form it is or why it is needed. The company is  [employer], phone [phone].  SW called [facility]  they said they were not clear on what patient was requesting - they provide PCS - but they have worked with patient's mom, and patient's daughter was employed there, so they have a 7 year history with the family and are happy to help in whatever way they can     SW called the [facility] Provider Support line [phone])  PCS: The form was received on 11/14. Status shows as open.   Care Manager: Loman Brooklyn, email address [email or web address], phone number [phone].    SW left a Engineer, technical sales for [place] and sent her a follow up email requesting a return call.       Shea Stakes, CCM  [facility] Manager  [facility]  [phone]

## 2023-08-29 NOTE — Unmapped (Signed)
PA for libre 3 sensor approved on 12/12. I have call pharmacy and they stated it went through and have let patient know.

## 2023-09-01 ENCOUNTER — Ambulatory Visit: Admit: 2023-09-01 | Discharge: 2023-09-02 | Payer: PRIVATE HEALTH INSURANCE

## 2023-09-01 MED ORDER — DIVALPROEX ER 500 MG TABLET,EXTENDED RELEASE 24 HR
ORAL_TABLET | Freq: Every day | ORAL | 1 refills | 30.00 days | Status: CP
Start: 2023-09-01 — End: 2024-08-31

## 2023-09-01 MED ORDER — DULOXETINE 60 MG CAPSULE,DELAYED RELEASE
ORAL_CAPSULE | Freq: Every day | ORAL | 3 refills | 90.00 days | Status: CP
Start: 2023-09-01 — End: 2024-08-26

## 2023-09-01 MED ORDER — IBUPROFEN 800 MG TABLET
ORAL_TABLET | Freq: Three times a day (TID) | ORAL | 2 refills | 20 days | Status: CP | PRN
Start: 2023-09-01 — End: 2023-09-01

## 2023-09-01 MED ORDER — AMLODIPINE 10 MG TABLET
ORAL_TABLET | Freq: Every day | ORAL | 3 refills | 90 days | Status: CP
Start: 2023-09-01 — End: 2024-08-31

## 2023-09-01 MED ORDER — COLCHICINE 0.6 MG TABLET
ORAL_TABLET | Freq: Every day | ORAL | 3 refills | 90.00 days | Status: CP
Start: 2023-09-01 — End: 2024-08-31

## 2023-09-01 MED ORDER — PREGABALIN 75 MG CAPSULE
ORAL_CAPSULE | Freq: Three times a day (TID) | ORAL | 1 refills | 90.00 days | Status: CP
Start: 2023-09-01 — End: 2023-09-01

## 2023-09-01 MED ORDER — BACLOFEN 10 MG TABLET
ORAL_TABLET | Freq: Three times a day (TID) | ORAL | 1 refills | 90.00 days | Status: CP | PRN
Start: 2023-09-01 — End: 2024-02-28

## 2023-09-01 MED ORDER — EZETIMIBE 10 MG TABLET
ORAL_TABLET | Freq: Every day | ORAL | 3 refills | 90.00 days | Status: CP
Start: 2023-09-01 — End: 2024-08-31

## 2023-09-01 MED ORDER — POLYETHYLENE GLYCOL 3350 17 GRAM/DOSE ORAL POWDER
Freq: Every day | ORAL | 0 refills | 30.00 days | Status: CP
Start: 2023-09-01 — End: 2023-09-01

## 2023-09-01 MED ORDER — NICOTINE 21 MG/24 HR DAILY TRANSDERMAL PATCH
MEDICATED_PATCH | TRANSDERMAL | 2 refills | 28.00 days | Status: CP
Start: 2023-09-01 — End: 2023-09-01

## 2023-09-01 MED ORDER — ONDANSETRON HCL 4 MG TABLET
ORAL_TABLET | Freq: Every day | ORAL | 1 refills | 30.00 days | Status: CP | PRN
Start: 2023-09-01 — End: 2024-08-31

## 2023-09-01 MED ORDER — OXYCODONE 5 MG TABLET
ORAL_TABLET | Freq: Two times a day (BID) | ORAL | 0 refills | 5.00 days | Status: CP | PRN
Start: 2023-09-01 — End: ?

## 2023-09-01 MED ORDER — LEVOTHYROXINE 100 MCG TABLET
ORAL_TABLET | Freq: Every day | ORAL | 2 refills | 90.00 days | Status: CP
Start: 2023-09-01 — End: 2023-09-01

## 2023-09-01 MED ORDER — MAGNESIUM OXIDE 400 MG (241.3 MG MAGNESIUM) TABLET
ORAL_TABLET | Freq: Every day | ORAL | 11 refills | 30.00 days | Status: CN
Start: 2023-09-01 — End: 2024-08-31

## 2023-09-01 MED ORDER — AMANTADINE HCL 100 MG CAPSULE
ORAL_CAPSULE | Freq: Two times a day (BID) | ORAL | 1 refills | 90.00 days | Status: CP
Start: 2023-09-01 — End: 2023-09-01

## 2023-09-01 MED ORDER — EMPAGLIFLOZIN 25 MG TABLET
ORAL_TABLET | Freq: Every day | ORAL | 3 refills | 90.00 days | Status: CP
Start: 2023-09-01 — End: 2024-08-31

## 2023-09-01 MED ORDER — CHOLECALCIFEROL (VITAMIN D3) 1,250 MCG (50,000 UNIT) CAPSULE
ORAL_CAPSULE | ORAL | 0 refills | 168.00 days | Status: CN
Start: 2023-09-01 — End: ?

## 2023-09-01 MED ORDER — QUETIAPINE 200 MG TABLET
ORAL_TABLET | Freq: Every evening | ORAL | 3 refills | 90.00 days | Status: CP
Start: 2023-09-01 — End: 2024-08-31

## 2023-09-01 MED ORDER — ACETAMINOPHEN 500 MG TABLET
ORAL_TABLET | Freq: Three times a day (TID) | ORAL | 3 refills | 90.00 days | Status: CP
Start: 2023-09-01 — End: 2024-08-31

## 2023-09-01 NOTE — Unmapped (Signed)
[facility]  Care Management Progress Note               Purpose of contact: SW met with patient during PCP appointment to discuss resources.      HCPOA: SW gave patient a copy of the form and explained it will have to be notarized. She said she has access to a notary.     Assisted living: Patient is no longer interested.     PCS (in-home aid through medicaid): Forms have been filled out by [facility] and faxed to [facility]. Patient thought she was set up with PCS through [facility] [place]; SW and patient called [employer] and they have not heard from [facility]. Patient will call [facility] today. Patient said she has had her initial PCS assessment (a nurse came out); all that should be left is for her to pick a company.     CAPS services (additional in-home support through medicaid): SW and patient called [employer]. Patient needs to contact [employer] and tell them she wants CAPS. SW filled out and faxed [employer] CAP/DA referral form and faxed it to them at fax# [phone].    Patient has SW's contact information and SW encouraged patient to call SW with any questions.    Shea Stakes, CCM  [facility] Manager  [facility]  [phone]

## 2023-09-01 NOTE — Unmapped (Addendum)
Per patient, believes she was out on this medication or has low supply of it.  Will refill today.  Next visit with psychiatry use on September 30, 2023.  Negative so hide [name]    Orders:    divalproex ER (DEPAKOTE ER) 500 MG extended released 24 hr tablet; Take 1 tablet (500 mg total) by mouth daily.    QUEtiapine (SEROQUEL) 200 MG tablet; Take 1 tablet (200 mg total) by mouth nightly.

## 2023-09-01 NOTE — Unmapped (Signed)
[facility] SUBJECT FOR NEW MSG: Medication Request Patient Name: Tonya Wood   Caller: Self (Patient)  Name of Caller:[name]   Have you contacted your pharmacy? yes      Last seen in-person: 09/01/2023  Last telemedicine visit: Visit date not found        Medication Name: Cold medicine   Dosage:NA  Route: NA  Frequency: NA  Day Supply Requested: NA   Pharmacy (Name & Address): [facility] [place], [place] - [address] [place] [address]    Pharmacy Phone Number: [phone]   Are there refills on medications? no  Has [facility] refilled this medication before? Yes
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
## 2023-09-01 NOTE — Unmapped (Addendum)
 Labs last checked on 04/2023. Controlled with synthroid   Orders:    levothyroxine (SYNTHROID) 100 MCG tablet; Take 1 tablet (100 mcg total) by mouth daily.

## 2023-09-01 NOTE — Unmapped (Signed)
[facility]  Care Management Progress Note               Purpose of contact: housing     Patient was scheduled with a FNP at the [facility] who reached out to SW. SW called patient - patient is not sure how or why that was scheduled, she would like to stay with Dr. Inda Castle. FNP canceled health dept appointment.    Patient wanted contact info for [employer] in [place]. SW gave her the phone number [phone]); patient plans to call today.       Patient has SW's contact information and SW encouraged patient to call SW with any questions.    Shea Stakes, CCM  [facility] Manager  [facility]  [phone]

## 2023-09-01 NOTE — Unmapped (Addendum)
 Last A1c 6.2 in 06/2023.  Per chart review patient is on Lantus and Trulicity.  Patient does not have a good understanding of current medications.  Patient is aware that she will likely need additional support with medication management.  Patient previously had medications managed by daughter but now brother will manage the medication moving forward.  Due to time constraints, was not able to go into detail about use of medications.  Will follow-up on Friday to focus further on type 2 diabetes management.     Lantus was refilled previously.  Orders:    empagliflozin (JARDIANCE) 25 mg tablet; Take 1 tablet (25 mg total) by mouth daily.

## 2023-09-01 NOTE — Unmapped (Addendum)
Blood pressure goal of 130/80.  Near goal today.  Will not change regimen at this time.  Will repeat blood pressure at next visit on December 20th.  Orders:    amlodipine (NORVASC) 10 MG tablet; Take 1 tablet (10 mg total) by mouth daily.

## 2023-09-01 NOTE — Unmapped (Addendum)
Last lipid panel back in January 2024 notable for triglycerides of 205 but normal cholesterol.  Will continue with current therapy.  Orders:    ezetimibe (ZETIA) 10 mg tablet; Take 1 tablet (10 mg total) by mouth daily.

## 2023-09-02 NOTE — Unmapped (Signed)
Attempted to call patient. No response. Have pt contact pharmacy as Oxycodone was sent yesterday, Stiolto respimat has refills, and Diflucan is not appropriate for refills pt would need appt.

## 2023-09-02 NOTE — Unmapped (Signed)
[facility] Family Medicine  Care Management Progress Note               Purpose of contact: Sw received request to call patient (Patient called and would like to discuss medicine as they feel their concerns are not being heard and are requesting to speak to you)    Patient said her pharmacy will not deliver her meds until PCP calls in all that she needs, and her pcp has not called in medication for her cold that they discussed. SW messaged PCP - he and patient made a plan not to start a new medication, and discuss additional medication at follow up appointment on 12/20.    SW updated patient and she confirmed she agrees with plan and will see PCP at 12/20 appointment.     Shea Stakes, CCM  [facility] Manager  [facility] Family Medicine  [phone]

## 2023-09-02 NOTE — Unmapped (Signed)
[facility] SUBJECT FOR NEW MSG: Medication Request Patient Name: Tonya Wood   Caller: Self (Patient)  Name of Caller:  Have you contacted your pharmacy? yes      Last seen in-person: 09/01/2023  Last telemedicine visit: Visit date not found        Medication Name: tiotropium-olodaterol (STIOLTO RESPIMAT) / oxyCODONE (ROXICODONE) / fluconazole (DIFLUCAN)   Dosage:2.5-2.5 mcg/actuation Mist / 5 MG immediate release tablet / 150 MG tablet   Route: Inhalation/ Oral (PO) /Oral  Frequency: Daily / PRN / Every 72 hours  Day Supply Requested: 15/ 2 puff/ 2  Pharmacy (Name & Address): [facility] [place], [place] - [address]    Pharmacy Phone Number: [phone]   Are there refills on medications? yes  Has [facility] refilled this medication before? Yes     The patient states that these medications were not submitted for refills so they are requesting it be resubmitted to the pharmacy that is listed.

## 2023-09-03 NOTE — Unmapped (Addendum)
[facility] Family Medicine  Care Management Progress Note               Purpose of contact: CPP appointment     SW scheduled patient with CPP for 12/19 at 8:30. Talked with her about bringing all medications she is taking.       Status of current referrals:  []  [facility] care manager: Patient has a Futures trader through [employer]; SW has left her multiple voicemails and sent an email hoping to help coordiante services. Contact is Loman [place], email address [email or web address], phone number [phone].   []  CAP/DA: (additional in-home assistance through Medicaid): SW filled out and faxed [employer] CAP/DA referral form and faxed it to them at fax# [phone] on 12/16. They will contact her to evaluate her for this program.   []  Housing: Patient lives with family members in [place]. SW gave information for Senior apartments in [place] on 12/18 and encouraged her to put in an application to get on their wait list.   []  Home heath: PT and OT through [facility].   []  HCPOA: SW gave patient a copy on 12/16; she said she has access to a notary. She would like her brother to be her HCPOA.  []  Medication administration concerns: CPP appointment with [name] on 12/19.   []  Neurology: Referral was made to [facility] at patient request. Patient is scheduled with [facility] on 5/7  []  PCS: [facility], which are in-home aide assistance with ADLs, through [facility]): SW faxed forms in November 2024 to [employer] ([facility] confirmed receipt on 11/14). Patient said she has had the initial PCS assessment, she has to call [facility] to confirm what company she wants to administer the services. Patient said she would call on 12/16.  []  Pain Management: Patient does not want to receive pain management with [facility]. SW messaged PCP McConner on 12/18 about [facility] in [place]; they sent over forms to complete for patient to be seen at their clinic.   []  Psychiatry: Patient was given information on crisis and walk in clinics. Appointment with [facility] on 1/14.    Shea Stakes, CCM  [facility] Manager  [facility] Family Medicine  [phone]

## 2023-09-03 NOTE — Unmapped (Signed)
MiraLAX has been helpful with constipation symptoms secondary to opioid use.  Will continue with medication and monitor symptoms.  Orders:    polyethylene glycol (GLYCOLAX) 17 gram/dose powder; Mix 17g (measure to line in cap) as directed and drink mixture daily.

## 2023-09-03 NOTE — Unmapped (Addendum)
Patient recently diagnosed with a COPD exacerbation on December 4 which was thought likely to be secondary to viral illness as she was around a lot of younger children.  At that time was prescribed azithromycin and her refilled Stiolto inhaler.  Patient's report ongoing cough since visit.  Denies difficulty breathing.  After examining inhaler use with patient, there is concern that cough could be secondary to improper inhaler use.  Patient does not have a spacer and patient reports often times that the inhaler would dispense the medication before it is properly placed.    Low concern for cough to be secondary to his COPD exacerbation or infectious etiology.  At this time cough is thought to be secondary to improper medication use.  Will follow-up on Friday to review medication.

## 2023-09-04 ENCOUNTER — Institutional Professional Consult (permissible substitution)
Admit: 2023-09-04 | Discharge: 2023-09-05 | Payer: PRIVATE HEALTH INSURANCE | Attending: Ambulatory Care | Primary: Ambulatory Care

## 2023-09-04 MED ORDER — LANTUS SOLOSTAR U-100 INSULIN 100 UNIT/ML (3 ML) SUBCUTANEOUS PEN
Freq: Every evening | SUBCUTANEOUS | 2 refills | 50.00 days | Status: CP
Start: 2023-09-04 — End: ?

## 2023-09-04 MED ORDER — UMECLIDINIUM 62.5 MCG-VILANTEROL 25 MCG/ACTUATION POWDR FOR INHALATION
Freq: Every day | RESPIRATORY_TRACT | 3 refills | 90 days | Status: CP
Start: 2023-09-04 — End: ?

## 2023-09-04 MED ORDER — CETIRIZINE 10 MG TABLET
ORAL_TABLET | Freq: Every day | ORAL | 3 refills | 90.00 days | Status: CP
Start: 2023-09-04 — End: 2024-09-03

## 2023-09-04 NOTE — Unmapped (Addendum)
STOP colchicine - okay to keep on hand and use ONLY as needed for gout flares.    Medications for quitting smoking:  Nicotine patch and gum    COPD medications:  STOP Stiolto Respimat  START Anoro Ellipta (red cap) 1 puff daily - use QUICK and deep breath  Albuterol 1-2 puffs every 4-6 hours as needed for shortness of breath or wheezing - SLOW and deep breath, separate puffs by around 1 minute    Blood pressure medications:  Amlodipine 1 tablet (10 mg) daily    Diabetes medications:  Trulicity 1.5 mg injection once weekly on Sundays  Lantus 30 units once daily  Jardiance 1 tablet (25 mg) daily    How often and when to check blood sugar:  Use the Freestyle Libre 3 CGM Reader as instructed and change sensors every 14 days.    How to treat low blood sugar:  For blood sugar less than 70 --> Treat with 4 ounces of juice or regular soda, or with 3 to 4 glucose tablets. Re-check blood sugar in 15 minutes.    If blood sugar is still less than 70 on re-check, treat again and re-check in 15 minutes.   If blood sugar is over 70 on re-check and it is time to eat your regular meal, eat regular meal and take insulin as prescribed for that blood sugar.    When to follow-up:  Thursday, January 16th at 11:30am with Tobi Bastos (pharmacist)      [name], PharmD, CPP, [facility]  Family Medicine Clinical Pharmacist  Clinic Phone: [phone]

## 2023-09-04 NOTE — Unmapped (Signed)
Subjective     Reason for visit:    Tonya Wood is a 57 y.o. female with a history of diabetes (type 2), COPD, HTN, schizoaffective disorder, and tobacco use who presents today for a diabetes pharmacotherapy visit.  Patient presents to this visit alone.    Known DM Complications: no known complications    Date of Last Diabetes Related Visit: 01/16/23 with CPP, 09/01/23 with PCP (McConner)    Action At Last Diabetes Related Visit:    Continue Trulicity 1.5 mg SubQ weekly  Continue Lantus 20 units daily  Continue Jardiance 25 mg daily  Continue Stiolto Respimat 2 puffs daily  Continue nicotine 21 mg/24h patch + 4 mg gum PRN  Continue ezetimibe 10 mg daily (statin previously stopped due to c/f contributing to CK elevation)  Encouraged pt to bring medications to future visits    Since Last visit / History of Present Illness:    Patient reports fully implementing plan from last visit. Now living with brother and family, although hopes to be moving elsewhere soon.    Had some trouble with CGM sensors the last time she filled, ended up pulling several off due to having bleeding upon insertion. Recently got refill of CGM sensors x2 and placed current sensor ~1 week ago.    Mentions she needs new HCPOA form as she misplaced the other one given to her by CM Meghan. Has been expecting to hear more about Medicaid transportation service recently but has not yet received a call or additional info.    Reports appetite is still not great. Feels sick when tries to force herself to eat something, maybe 3-4x/week. Notes she was not hungry this AM but did have cappacino.    In regards to inhalers, reports using albuterol 3-4 times per day and demonstrates inhaler technique with relatively quick breath and inhales puffs back to back without waiting. Mentions it is sometimes hard to turn and set Respimat inhaler due to pain and dexterity issues in hands so is not using consistently since not sure how to use it exactly.    Smokes only once in a blue moon, maybe 3 cigarettes per day. Tries to chew nicotine gum as much as possible to avoid smoking cigarettes.    Lastly, reports having knots on R ear and inside outer ear as well that are really painful and wants PCP to address. Believes knots developed and pain progressively worsened ~1 month ago.    Reported DM Regimen:  Lantus 30 units nightly - self-increased from 20 units ~1 month ago  Trulicity 1.5 mg weekly (Sundays)  Jardiance 25 mg daily    DM medications tried in the past:   Metformin - pt doesn't like it so is not willing to take  Novolog - decreased insulin needs    Medication Adherence and Access:  Since last visit, patient denies missing doses of medications recently (including insulin), and has developed new system of marking down in a paper log book when she takes all meds so she know she doesn't miss or double up on them. Note she has had intermittent non-adherence in the recent past with periods of severe pain.    Brought all meds to visit today to complete med rec. Using weekly pillbox, which currently is being filled and managed by brother.    Pt had [place] [facility] in the past but now has active Medicaid.    Reports being out of Zyrtec and requests refill today.    SMBG  per  CGM report :  Was not checking BG/glucose levels for quite some time due to issues with CGM sensors and not able to refill for several weeks but recently was able to refill and current CGM sensor expires in 9 days (so has been on for ~5 days). Prior to having new CGM sensors, used family member's glucometer and BG was high 300s on multiple occasions so self-increased insulin.    7-day CGM report:  Above 40%  In target 60%  Below 0%    Hypoglycemia:    Symptoms of hypoglycemia since last visit: no  If yes, it was treated by: n/a    DM-Related Prevention:  Statin: Not taking (stopped statin due to concern for causing elevated CK);  previously on atorvastatin 40 mg (high intensity) , now on ezetimibe 10 mg daily  Aspirin: unclear if indicated (pt reports history of CVA that occurred prior to seeking care at [facility] so not in recent health records); Taking    ACEI/ARB: Not taking (previously on lisinopril 20 mg but c/f dry cough and ACEi stopped 06/2023); Urine MA/CR Ratio - normal (last checked 01/01/23).  Last eye exam: 04/24/20 - DUE  Last foot exam: 01/01/23  Tobacco Use: Current smoker  Immunizations:   Immunization History   Administered Date(s) Administered    COVID-19 VAC,BIVALENT(77YR UP),PFIZER 08/08/2021    COVID-19 VAC,MRNA,TRIS(12Y UP)(PFIZER)(GRAY CAP) 01/28/2020, 02/18/2020    COVID-19 VACC,MRNA,(PFIZER)(PF) 01/28/2020, 02/18/2020, 08/21/2020    Covid-19 Vac, (38yr+) (Comirnaty) Mrna Pfizer  07/02/2023    Covid-19 Vac, (30yr+) (Spikevax) Monovalent Moderna 08/01/2022    Covid-19 Vacc, Unspecified 01/28/2020, 02/18/2020, 08/21/2020    Hep A / Hep B 09/27/2003, 10/28/2003, 10/17/2006    INFLUENZA TIV (TRI) 20MO+ W/ PRESERV (IM) 02/25/2008, 07/11/2010    INFLUENZA TIV (TRI) PF (IM)(HISTORICAL) 07/01/2013    INFLUENZA VACCINE IIV3(IM)(PF)6 MOS UP 07/02/2023    Influenza Vaccine Quad(IM)6 MO-Adult(PF) 06/09/2015, 01/08/2017, 07/22/2018, 06/30/2020, 07/17/2021, 05/21/2022    Influenza Virus Vaccine, unspecified formulation 06/30/2020    PPD Test 07/06/2014, 07/15/2014, 05/22/2016    Pneumococcal Conjugate 20-valent 12/28/2021    SHINGRIX-ZOSTER VACCINE (HZV),RECOMBINANT,ADJUVANTED(IM) 07/02/2023    TdaP 12/19/2011, 05/05/2019     _________________________________________________    Past Medical History: reviewed PMH in epic today    Social History:  Social History     Tobacco Use   Smoking Status Every Day    Current packs/day: 2.00    Average packs/day: 2.0 packs/day for 37.9 years (75.8 ttl pk-yrs)    Types: Cigarettes    Start date: 10/17/1985   Smokeless Tobacco Never       Medications: Medications reviewed in EPIC medication station and updated today by the clinical pharmacist practitioner.     Objective   Review of Systems:  Constitutional:  No fever, chills or unintentional weight loss  Cardiovascular:  No chest pain or pressure, shortness of breath, orthopnea or LE edema  Pulmonary:  No cough or SOB  GI:  No constipation, diarrhea, dyspepsia, change in bowel habits, nausea, abdominal pain  Endocrine: No polyuria, polyphagia, blurred vision    Physical Examination:  Vitals:    There were no vitals filed for this visit.     Wt Readings from Last 3 Encounters:   09/01/23 86.2 kg (190 lb)   08/12/23 85 kg (187 lb 6.4 oz)   07/23/23 83.6 kg (184 lb 3.2 oz)       There is no height or weight on file to calculate BMI.    The 10-year ASCVD risk score (Arnett DK,  et al., 2019) is: 17.5%    Values used to calculate the score:      Age: 22 years      Sex: Female      Is Non-Hispanic African American: Yes      Diabetic: Yes      Tobacco smoker: Yes      Systolic Blood Pressure: 115 mmHg      Is BP treated: Yes      HDL Cholesterol: 41 mg/dL      Total Cholesterol: 152 mg/dL    Note: For patients with SBP <90 or >200, Total Cholesterol <130 or >320, HDL <20 or >100 which are outside of the allowable range, the calculator will use these upper or lower values to calculate the patient???s risk score.    mMRC:  Most recent MMRC dyspnea scale: 3 Last MMRC date: 05/13/2023    Labs:   Lab Results   Component Value Date    A1C 6.2 (H) 07/02/2023    A1C 5.7 (H) 01/01/2023     Lab Results   Component Value Date    CHOL 152 09/25/2022    TRIG 205 (H) 09/25/2022    HDL 41 09/25/2022    LDL 70 09/25/2022           Assessment/Plan:    1. Diabetes, type 2: controlled per last A1c of 6.2% (07/02/23). Goal <7% per ADA guidelines. Per CGM report for past ~5 days, time in range of 60% is slightly below goal >70%. However, pt just recently resumed using CGM and last A1c well controlled as well as self-titrated insulin, so will hold off on making any further changes to med regimen at this time. In the future may consider retitrating Trulicity again, though reasonable to defer for now given pt concerns about appetite. Reassured pt okay to use CGM sensors if bleeding with insertion in the future but just to be aware of potential inaccuracies for the first 24 hours after applying sensor.  Continue Trulicity 1.5 mg SubQ once weekly  Continue Lantus 30 units daily  Continue Jardiance 25 mg daily  Repeat A1c due between Jan and April 2025  Reviewed symptoms and treatment of hypoglycemia, including need to check BG prior to and following treatment.  SMBG instructions:  use Libre 3 CGM as instructed (change sensors every 14 days)  Provided pt with phone number for [employer] support line if needs CGM sensor replacement in the future  Future considerations:  Taper insulin as indicated  Room to titrate Trulicity back up if tolerated (on lower dose after intermittent issues with drug shortages earlier in 2024)    2. Mild COPD (FEV1 >80% on PFTs 04/2022): pt reports frequent albuterol use, though difficulties with using Stiolto Respimat inhaler consistently. Also had issues with recurrent thrush on triple inhaler therapy so this is why she has been on LABA/LAMA instead. Reviewed LABA/LAMA inhaler options and noted pt has been on Anoro Ellipta in the past and feels more comfortable w/ inhaler technique for this device so agreed to switch back to this now. Reviewed appropriate inhaler technique today and will continue to monitor closely given concerns for inappropriate technique in the past. Also continuing to work on smoking cessation as below.  Stop Stiolto Respimat 2 puffs daily  Start Anoro Ellipta 1 puff daily  Continue albuterol HFA inhaler (w/ spacer) q4-6h PRN    3. Smoking cessation: pt reports working to reduce # of cigarettes per day but has not yet quit. Using NRT patch +  gum which are helping, confirms stopping Chantix after previous visit given unclear benefit at the time. Will continue to hold off on stepping down nicotine patch dose since pt still smoking occasionally, but will reconsider once she is ready to quit smoking cigarettes altogether.  Continue nicotine 21 mg/24h patch daily + 4 mg gum PRN  Consider stepping down patch to 14 mg/24h dose as able in the future    4. ASCVD Prevention: moderate to high risk based on 10-year ASCVD risk score. No longer on high intensity statin due to some concern it was contributing to elevated CK levels, but on ezetimibe since June 2023. Last lipid panel with LDL 70 (09/25/22) and improved from 123 in Oct 2023. Goal LDL <70 per 2022 ACC ECDP given DM + ASCVD risk >7.5%. Since LDL essentially at goal, will not pursue any additional non-statin LDL lowering therapies at this time.  Continue ezetimibe 10 mg daily    5. Hypertension: relatively controlled based on last clinic BP of 125/86 (09/01/23). Goal <130/80 per ADA guidelines. No further med changes warranted at this time.  Continue amlodipine 10 mg daily  Future considerations:  Resume ARB (possible cough with ACEi) if additional BP lowering needed and choose agent that comes in combo w/ amlodipine if able  May also consider addition of thiazide diuretic if additional BP lowering needed on max doses of other first line agents    6. Medication management and care coordination:  Printed [place] Advance Directive form for pt w/ HCPOA info on it and encouraged to have signed/notarized and bring to future PCP visit.  Will notify new PCP of previous discussion about considering increasing Depakote back to previously stable dose of 1000 mg per day as appropriate.  Recommended pt stop daily colchicine and okay to take PRN only for gout flares given last uric acid level at target <6 and no need for daily prophylaxis with not being on any ULT. Also should help to reduce pill burden.  Sent refill Rx for cetirizine per pt request.  Agreed to message CM Meghan about pt still waiting to hear back about Medicaid transportation.        Follow-up: later this week with MD as scheduled and ~3-4 weeks with CPP    Future Appointments   Date Time Provider Department Center   09/04/2023  8:30 AM Melba Coon, CPP [facility]   09/04/2023  2:30 PM Graciella Freer, OT [facility]   09/05/2023  2:20 PM Hite, Ileene Rubens, MD [facility]   09/09/2023 11:15 AM Duanne Moron, PT [place] [facility]   09/11/2023 10:20 AM McCain, [place] Steven, DO [id number]   09/15/2023  9:00 AM Arneta Cliche, MD [facility]   09/16/2023 To Be Determined Duanne Moron, PT [place] [facility]   09/18/2023 To Be Determined Graciella Freer, OT [facility]   09/24/2023 To Be Determined Graciella Freer, OT [facility]   09/30/2023  8:30 AM Ewing Schlein, MD [facility]   10/08/2023  1:00 PM Kirby Crigler, MD [facility]   10/29/2023  9:00 AM Penelope Galas, MD/DMD [id number]   12/01/2023  9:00 AM [facility] [place] [facility] [place] [facility]   01/21/2024 11:20 AM Kelle Darting, MD [facility]       I spent a total of 30 minutes face to face with the patient delivering clinical care and  providing education/counseling.    _________________________________________________    [name], PharmD, CPP, [facility]  Family Medicine Clinical Pharmacist

## 2023-09-05 ENCOUNTER — Ambulatory Visit: Admit: 2023-09-05 | Discharge: 2023-09-06 | Payer: PRIVATE HEALTH INSURANCE

## 2023-09-05 DIAGNOSIS — Z1211 Encounter for screening for malignant neoplasm of colon: Principal | ICD-10-CM

## 2023-09-05 MED ORDER — BLOOD GLUCOSE TEST STRIPS
ORAL_STRIP | 11 refills | 0.00 days | Status: CP
Start: 2023-09-05 — End: 2024-09-04

## 2023-09-05 NOTE — Unmapped (Signed)
BP wnl today. Amlodipine 10mg . Will not make any changes today.

## 2023-09-05 NOTE — Unmapped (Signed)
[facility]  Established Patient Clinic Note    Assessment/Plan:   Tonya Wood is a 57 y.o.female    Assessment & Plan  Chronic bilateral low back pain without sciatica  Pt requesting refill of oxycodone today. Is also on many multimodal agents for pain relief. Not able to refill today as pt received a 30 day supply on 08/22/23 and then a 5 day supply on 09/01/23       Type 2 diabetes mellitus with other circulatory complication, with long-term current use of insulin (CMS-HCC)  Needs test strips that match her glucometer      Orders:    blood sugar diagnostic (GLUCOSE BLOOD) Strp; Test blood glucose 3 times daily.    Right ear pain  States there is a bump in her R ear canal that is causing her pain. I do not appreciate any bump or lesion or rash on exam. TM appears normal. No pain w/ jaw opening, possible TMJ vs related to MS recommended to use voltaren gel not to be used inside the ear canal.        Essential hypertension  BP wnl today. Amlodipine 10mg . Will not make any changes today.           I personally spent 35 minutes face-to-face and non-face-to-face in the care of this patient, which includes all pre, intra, and post visit time on the date of service.    follow up  Future Appointments   Date Time Provider Department Center   09/09/2023 11:15 AM Tonya Wood, PT [facility]   09/11/2023 10:20 AM Wood, Tonya Steven, DO [id number]   09/15/2023  9:00 AM Tonya Cliche, MD [facility]   09/16/2023 To Be Determined Tonya Wood, PT [facility]   09/18/2023 To Be Determined Tonya Wood, OT [facility]   09/24/2023 To Be Determined Tonya Wood, OT [facility]   09/30/2023  8:30 AM Tonya Schlein, MD [facility]   10/02/2023 11:30 AM Tonya Wood, C[facility]   10/08/2023  1:00 PM Tonya Crigler, MD [facility]   10/29/2023  9:00 AM Tonya Galas, MD/DMD [id number]   12/01/2023  9:00 AM [facility] [place] [facility]   01/21/2024 11:20 AM Tonya Darting, MD [facility]       Attending: Parke Poisson, MD     Subjective   Tonya Wood is a 57 y.o. female  coming to clinic today for the following issues:    Chief Complaint   Patient presents with    Medication Refill    Follow-up    Ear Pain     Patient said she has bumps around her ear.     HPI:    All pain meds, bipolar meds were refilled at last visit w/ Dr. Inda Castle.   She's miserable, not getting out of bed, not eating.   DM trulicity, lantus, and jardiance A1C not due for another month. Has CGM   Follow up BP on amlodipine 10mg  monotherapy, BP well controlled  COPD breathing is a bit better. Tobi Bastos helped her with getting a different inhaler that may be easier to use      I have reviewed the problem list, medications, and allergies and have updated/reconciled them if needed.    Tonya Wood  reports that she has been smoking cigarettes. She started smoking about  37 years ago. She has a 75.8 pack-year smoking history. She has never used smokeless tobacco.  Health Maintenance   Topic Date Due    Retinal Eye Exam  04/24/2021    Colon Cancer Screening  05/22/2023    Zoster Vaccines (2 of 2) 08/27/2023    Lung Cancer Screening Shared Decision Making  12/03/2023    Hemoglobin A1c  12/31/2023    Foot Exam  01/01/2024    Urine Albumin/Creatinine Ratio  01/01/2024    Serum Creatinine Monitoring  07/05/2024    Potassium Monitoring  07/05/2024    Mammogram  11/19/2024    HPV Cotest with Pap Smear (21-65)  01/16/2026    Pap Smear with Cotest HPV (21-65)  01/16/2026    Lipid Screening  09/26/2027    COPD Spirometry  07/22/2028    DTaP/Tdap/Td Vaccines (3 - Td or Tdap) 05/04/2029    Pneumococcal Vaccine 0-64  Completed    Hepatitis C Screen  Completed    COVID-19 Vaccine  Completed    Influenza Vaccine  Completed       Objective     VITALS: BP 100/67 (BP Position: Sitting)  - Pulse 81  - Temp 36.3 ??C (97.3 ??F) (Temporal)  - Ht 170.2 cm (5' 7.01)  - Wt 85.7 kg (189 lb)  - BMI 29.59 kg/m??     Physical Exam  General: well-appearing, sitting upright in no acute distress  Head: Normocephalic, atraumatic  ENT: No dental trauma noted.   Eyes: conjunctiva normal, non-erythematous, non-icteric, no discharge.  Neck: no thyroid enlargement or masses  Lungs: No increased work of breathing or audible wheezing  Skin: Warm, dry, no erythema or rash on exposed skin  Musculoskeletal: No visible gait abnormalities  Neurologic: Alert, no gross sensorimotor abnormalities  Psychiatric: Pleasant, cooperative, good eye contact, appropriate thought processes       LABS/IMAGING  I have reviewed pertinent recent labs and imaging in Epic    [facility]  [place] [facility] [place] [facility]  [address] [place], [place] [address]  Telephone [phone]  Fax [phone]  [email or web address]

## 2023-09-05 NOTE — Unmapped (Addendum)
Needs test strips that match her glucometer      Orders:    blood sugar diagnostic (GLUCOSE BLOOD) Strp; Test blood glucose 3 times daily.

## 2023-09-06 NOTE — Unmapped (Signed)
I was the supervising attending present and available in clinic during this patient's visit.   I have reviewed the patient's medical record and agree with the assessment and plan as documented in the resident's note.     Iliyana Convey F Elizeo Rodriques, MD

## 2023-09-08 ENCOUNTER — Ambulatory Visit: Admit: 2023-09-08 | Discharge: 2023-09-09 | Payer: PRIVATE HEALTH INSURANCE

## 2023-09-08 MED ORDER — OXYCODONE 5 MG TABLET
ORAL_TABLET | Freq: Three times a day (TID) | ORAL | 0 refills | 5.00 days | Status: CP | PRN
Start: 2023-09-08 — End: 2023-09-13
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
## 2023-09-09 NOTE — Unmapped (Signed)
I was the supervising physician in the delivery of this service Ulla Gallo, MD

## 2023-09-11 ENCOUNTER — Ambulatory Visit: Admit: 2023-09-11 | Discharge: 2023-09-12 | Payer: PRIVATE HEALTH INSURANCE

## 2023-09-11 DIAGNOSIS — F332 Major depressive disorder, recurrent severe without psychotic features: Principal | ICD-10-CM

## 2023-09-11 DIAGNOSIS — M549 Dorsalgia, unspecified: Principal | ICD-10-CM

## 2023-09-11 DIAGNOSIS — F250 Schizoaffective disorder, bipolar type: Principal | ICD-10-CM

## 2023-09-11 DIAGNOSIS — G8929 Other chronic pain: Principal | ICD-10-CM
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
## 2023-09-12 ENCOUNTER — Ambulatory Visit: Admit: 2023-09-12 | Discharge: 2023-09-13 | Payer: PRIVATE HEALTH INSURANCE

## 2023-09-12 MED ORDER — BLOOD GLUCOSE TEST STRIPS
ORAL_STRIP | 11 refills | 0.00 days | Status: CP
Start: 2023-09-12 — End: 2023-09-12

## 2023-09-12 MED ORDER — OXYCODONE 5 MG TABLET
ORAL_TABLET | Freq: Two times a day (BID) | ORAL | 0 refills | 32.00 days | Status: CP | PRN
Start: 2023-09-12 — End: 2023-09-12

## 2023-09-12 MED ORDER — FREESTYLE LIBRE 3 PLUS SENSOR DEVICE
3 refills | 0.00 days | Status: CP
Start: 2023-09-12 — End: 2024-09-11

## 2023-09-12 MED ORDER — DIVALPROEX ER 500 MG TABLET,EXTENDED RELEASE 24 HR
ORAL_TABLET | Freq: Every day | ORAL | 2 refills | 45.00 days | Status: CP
Start: 2023-09-12 — End: 2023-09-12

## 2023-09-12 MED ORDER — CHOLECALCIFEROL (VITAMIN D3) 1,250 MCG (50,000 UNIT) CAPSULE
ORAL_CAPSULE | ORAL | 0 refills | 168.00 days | Status: CP
Start: 2023-09-12 — End: 2023-09-12

## 2023-09-12 NOTE — Unmapped (Signed)
[facility] SUBJECT FOR NEW MSG: Pharmacy Call Patient Name: Tonya Wood   Pharmacy (Name & Address): [facility]   Name of Caller: [facility] [place] [address]   Pharmacy Phone Number: [phone]   Reason for Call: Med Concern: [name] [facility]) called with concern.  Oxycodone prescription called in today, pharmacist wanted confirm early refill is ok since it has been filled by different doctors 3 times this month, this will be the fourth time this month if current prescription is filled.  Medication Name:     oxyCODONE (ROXICODONE)      Dosage: 5 MG immediate release tablet   1 Tablet Bid  Patient Waiting at Pharmacy? no
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
## 2023-09-12 NOTE — Unmapped (Signed)
Patient called in asking for an update for their medication. Patient is concerned they wont receive the medication before the weekend. Patient will be calling the pharmacy back to receive an update

## 2023-09-12 NOTE — Unmapped (Addendum)
Patient was previously on 1,000 mg of Depakote. Last refill dosage was incorrect reduced to 500 mg. Will increase back at previous dosage.     Orders:    divalproex ER (DEPAKOTE ER) 500 MG extended released 24 hr tablet; Take 2 tablets (1,000 mg total) by mouth daily.
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
## 2023-09-12 NOTE — Unmapped (Deleted)
Orders:    blood-glucose sensor (FREESTYLE LIBRE 3 PLUS SENSOR) Devi; Apply Freestyle Libre 3 sensor every 15 days.
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
## 2023-09-12 NOTE — Unmapped (Addendum)
T2DM regime recently modified by clinical pharmacist. Per last visit:    Continue Trulicity 1.5 mg SubQ once weekly  Continue Lantus 30 units daily  Continue Jardiance 25 mg daily    Patient showed CGM in the 230's at visit, however, did not take medication today. Will follow up in 1 month to repeat A1c and adjust medication at that time.     Orders:    blood sugar diagnostic (GLUCOSE BLOOD) Strp; Test blood glucose 3 times daily.

## 2023-09-12 NOTE — Unmapped (Addendum)
Hi Tonya Wood,    It was great to see you today. I look forward to seeing you at your next visit. In the meantime, for non-urgent questions, please message our clinic on MyChart. I am typically able to respond to these messages within 3-5 business days. For more urgent matters, please call our clinic at [phone]. In the event of a medical emergency, call 911 and/or visit our urgent care or an emergency room.     Today I provided the full dose of your oxycodone (please come back next month for refill). We increase the Depakote from 500 mg to 1,000 mg. We discussed your diabetes. Since the regime was recently changed please reach out to [name] to update her on the suagr values. I have also refilled the diabetes strips and sensors.     Take care,    Donia Ast, MD  He, him, his  Resident Physician, PGY-2    [facility]   [address]   [place], [place] [address]   [phone] (Phone); [phone] (Fax)     [facility] MyChart is for non-urgent messages. I do my best to respond within 3 business days, however occasionally it may take longer. If you have immediate concerns, contact our clinic by phone [phone].      [facility] has an Urgent Care!  Family Medicine Urgent Care Hours: Monday-Friday 7am-8pm; Saturday and Sunday 12pm-5pm   If you think you are having an emergency, please call 911 or go to your nearest emergency department.

## 2023-09-15 NOTE — Unmapped (Signed)
[facility]  Care Management Progress Note               Purpose of contact: Referrals and services     Patient called SW. She said PCS has not completed the initial assessment (she previously reported they had). SW called patient's Education administrator, [place]. Rufina Falco said a nurse has been assigned and will meet with patient soon - the RN will call patient directly to schedule. SW updated patient.     Patient said her blood sugar readings ahve been low - it is about 115 now, but over the last two days it has been as low as 37. She  said it stayed in the 30s and 40s for the last two days. She said she has been eating more than usual, and drinking juice, and it continued to stay that low. SW messaged PCP [employer] and CPP [facility] the update.     Update 2pm:  SW talked with patient and let patient know CPP said sometimes the CGM sensors can give inaccurate readings for the first 24 hours after applying a new one. Patient said she put the new sensor in last Thursday or Friday (3 or 4 days ago). She did verify the low readings with a fingerstick. Blood sugar is 99 now. SW encouraged her to call the clinic if it begins to get low again. SW updated PCP and CPP.     Status of current referrals:  [x]  [facility] care manager: Patient has a Futures trader through [employer] [facility] [employer] [place], email address [email or web address], phone number [phone]. SW last spoke with her on 12/30  [x]  CAP/DA: (additional in-home assistance through [facility]): SW filled out and faxed [employer] CAP/DA referral form and faxed it to them at fax# [phone] on 12/16. They will contact patient to evaluate her for this program.   [x]  Housing referrals: Patient lives with family members in [place]. SW gave information for Senior apartments in [place] on 12/18 and encouraged her to put in an application to get on their wait list.   [x]  Home heath: PT and OT through [facility].   [x]  HCPOA: SW gave patient a copy on 12/16; she said she has access to a notary. She would like her brother to be her HCPOA.  [x]  Medication administration concerns: Follow up with CPP [name] scheduled for 1/16  [x]  Neurology: Referral was made to [facility] Neurology at patient request; last appointment was on 12/10. Patient is scheduled with [facility] Neurology on 5/7  [x]  PCS referral: (Personal Care Services are in-home aide assistance with ADLs, through [facility]): SW faxed forms in November 2024 to [employer] ([facility] confirmed receipt on 11/14). Patient's [employer] care manager Rufina Falco confirmed on 12/30 that a nurse will be calling patient soon to complete the assessment.   [x]  Pain Management: Patient does not want to receive pain management with [facility]. She would like a referral to [facility] in [place]; they sent over forms to complete for patient to be seen at their clinic; SW completed and faxed back on 12/20.   [x]  Psychiatry: Patient was given information on crisis and walk in clinics. Appointment with [facility] Psychiatry on 1/14.       Provider/Care Partner(s) to follow up on: N/A    Shea Stakes, CCM  [facility]  [facility]  [phone]

## 2023-09-16 NOTE — Unmapped (Signed)
[facility]  Care Management Progress Note               Purpose of contact: PCS    [facility] Manger left SW a voicemail- she said PCS form was not complete and will need to be re-done.     SW left her a voicemail requesting a call back to explain which parts were incomplete so it can be correctly sent over.     Shea Stakes, CCM  [facility] Manager  [facility]  [phone]

## 2023-09-19 NOTE — Unmapped (Addendum)
[facility]  Care Management Progress Note               Purpose of contact: PCS    PCS: SW put PCS form in PCP's box to sign. Emailed a copy to sign to PCP on 1/3.    CAPS: Patient left social worker a voicemail       Status of current referrals:  [x]  [facility] care manager: Patient has a Futures trader through [employer] [place], email address [email or web address], phone number [phone]. SW last spoke with her on 12/30  [x]  CAP/DA: (additional in-home assistance through [facility]): SW filled out and faxed [employer] CAP/DA referral form and faxed it to them at fax# [phone] on 12/16. They will contact patient to evaluate her for this program.   [x]  Housing referrals: Patient lives with family members in [place]. SW gave information for Senior apartments in [place] on 12/18 and encouraged her to put in an application to get on their wait list.   [x]  Home heath: PT and OT through [facility].   [x]  HCPOA: SW gave patient a copy on 12/16; she said she has access to a notary. She would like her brother to be her HCPOA.  [x]  Medication administration concerns: Follow up with CPP [name] scheduled for 1/16  [x]  Neurology: Referral was made to [facility] Neurology at patient request; last appointment was on 12/10. Patient is scheduled with [facility] Neurology on 5/7  [x]  PCS referral: (Personal Care Services are in-home aide assistance with ADLs, through [facility]): SW faxed forms in November 2024; [facility] notified SW on 09/16/23 that form is incomplete and they will not accept. Updated form put in PCP's box on 1/2.   [x]  Pain Management: Patient does not want to receive pain management with [place]. She would like a referral to [facility] in [place]; they sent over forms to complete for patient to be seen at their clinic; SW completed and faxed back on 12/20.   [x]  Psychiatry: Patient was given information on crisis and walk in clinics. Appointment with [facility] Psychiatry on 1/14.    Shea Stakes, CCM  [facility] Manager  [facility]  [phone]

## 2023-09-22 NOTE — Unmapped (Signed)
[facility]  Care Management Progress Note               Purpose of contact: PCS and CAP services     PCS: SW emailed completed form to [email or web address] and cc'ed patient's [employer] Care Manager, Sheliah Mends.     CAPS (additional in-home services through medicaid): SW called [place] [employer] (phone: [phone]). They received the referral from SW. Patient should have received paperwork in the mail - she completes part of it and PCP completes the other part. (Consent, Case Management Entity, and Level of Care). They are not able to send SW a copy it has to go directly to patient.     SW called patient - her ankle is sore from the falls, she does not want to come in to [facility] for an appointment as she is tired of appointments. She is trying not to walk/move around as much. She does have a walker.     Patient has CAPS paperwork and will bring it to her 1/28 appointment with Dr. Pernell Dupre      Status of current referrals:  [x]  [facility] care manager: Patient has a care manager through [employer] [facility] [employer] [place], email address [email or web address], phone number [phone]. SW last spoke with her on 12/30  [x]  CAP/DA: (additional in-home assistance through [facility]): SW filled out and faxed [employer] received form faxed by SW on 12/16. CAPS mailed patient paperwork that she will bring to her 1/28 [facility] appointment to be completed.  [x]  Housing referrals: Patient lives with family members in [place]. SW gave information for Senior apartments in [place] on 12/18 and encouraged her to put in an application to get on their wait list.   [x]  Home heath: PT and OT through [facility].   [x]  HCPOA: SW gave patient a copy on 12/16; she said she has access to a notary. She would like her brother to be her HCPOA.  [x]  Medication administration concerns: Follow up with CPP [name] scheduled for 1/16  [x]  Neurology: Referral was made to [facility] Neurology at patient request; last appointment was on 12/10. Patient is scheduled with [facility] Neurology on 5/7  [x]  PCS referral: (Personal Care Services are in-home aide assistance with ADLs, through [facility]): SW faxed forms in November 2024; [facility] notified SW on 09/16/23 that form is incomplete and they will not accept. Updated form faxed on 1/6 [facility] Manager was cc'ed).  [x]  Pain Management: Patient does not want to receive pain management with [facility]. She would like a referral to [facility] in [place]; they sent over forms to complete for patient to be seen at their clinic; SW completed and faxed back on 12/20.   [x]  Psychiatry: Patient was given information on crisis and walk in clinics. Appointment with [facility] Psychiatry on 1/14.    Shea Stakes, CCM  [facility] Manager  [facility]  [phone]

## 2023-09-25 ENCOUNTER — Encounter: Admit: 2023-09-25 | Payer: PRIVATE HEALTH INSURANCE

## 2023-09-29 NOTE — Unmapped (Unsigned)
No show (PCP).     Medication recommendations:   ***    Psychotherapy recommendations: ***    Psychosocial interventions: ***    Additional workup:   ***    Follow-up:  {PCC Follow up:73855}.  They have been informed to continue to follow up with their PCP for ongoing medication management and any additional questions/concerns after this appointment.  They have been provided with suicide crisis resources in their AVS and instructed to call 911 for emergences.     As a reminder, follow up questions may also be addressed using E-consults (Place referral for Ambulatory E-Consult to Psychiatry order).    Thank you for the opportunity to participate in the care of Tonya Wood. Please contact me with further questions or concerns.    {PSY staffing:42303}the attending MD, {PCC Attending:63372}, who agrees with the assessment and plan.    [facility] Schlein, MD    Subjective:    Psychiatric Chief Concern:  ***    HPI: Patient is a 58 y.o., Black/African American race, Not Hispanic, Latino/a, or Spanish origin ethnicity,  ENGLISH speaking female  with a history of ***.      Mood Symptoms:  ***  Mania screen: ***  Safety: Suicidality: ***, Self-Harm: ***, Homicidality: ***    Anxiety Symptoms: ***  Trauma related symptoms: ***    Psychotic Symptoms: ***    Substance Use/Abuse:   Alcohol: {RRALCOHOLUSE:24195}  Illicit: {RRILLICTDRUGUSE:24199}  Licit: {RRLICITDRUGUSE:24200}  Tobacco - ***  Caffeine - ***    Cognitive Symptoms: ***  {Select if ADHD/inattention evaluation (Optional), if not will disappear:99902}    Psychiatric History:  Diagnoses: ***  Medication Trials: ***  Hospitalizations: ***  Suicide attempts: ***  Current Psychiatrist: ***  Current Therapist: ***  Prior behavioral health providers: ***    Medical/Surgical History:  {GSC Past Medical History:30421616}    {GSC Past Surgical History:30421619}    Social History:  Living Situation: ***  Guardian/Payee: {PSY Answer:22850}  Relationship Status: {PSY Relationship Status:23025}  Children: {PSY Answer:22850}  Education: {PSY Education:23084}  Income/Employment/Disability: {PSY Employment:23085}  Abuse/Neglect/Trauma: ***  Current/Prior Legal: {PSY Answer:22850}  Access to Firearms: {PSY Access Firearms:23086}     Family History:  Family Psych History: ***  {GSC Family History:30421602}      Objective:     Vitals:   There were no vitals filed for this visit.    Mental Status Exam:  Appearance:    {PSY Appearance:23008}   Motor:   {PSY Motor:23010}   Speech/Language:    {PSY Speech/Lang:23011}   Mood:   {PSY Mood:23012}   Affect:   {PSY Affect:23013}   Thought process:   {PSY Thought Process:23015}   Thought content:     {PSY Thought Content:23016}   Perceptual disturbances:     {PSY Perceptual Disturbances:23017}     Orientation:   {PSY Orientation:23018}   Attention:   {PSY Attention:23019}   Concentration:   {PSY Concentration:23020}   Memory:   {PSY Memory:23021}    Fund of knowledge:    {PSY Fund of Knowledge:23022}   Insight:     {PSY Insight:23023}   Judgment:    {PSY Judgment:23024}   Impulse Control:   {PSY Impulse Control:23555}       Test Results:  I personally reviewed the following results in Epic  {PCC Tests:73854}    Psychometrics  Collected today: {PCCPsychometrics:76965}    PHQ-9 PHQ-9 TOTAL SCORE   09/01/2023   9:30 AM 19   05/13/2023   1:45 PM 24  02/20/2023   2:00 PM 26   01/01/2023   3:13 PM 23   12/28/2021   9:00 AM 7     GAD7 Total Score GAD-7 Total Score   09/01/2023   9:30 AM 18   02/20/2023   2:00 PM 19   12/28/2021   9:00 AM 19         Visit was completed by video (or phone) and the appropriate disclaimer has been included below.    {    Coding tips - Do not edit this text, it will delete upon signing of note!    Telephone visits [phone] for Physicians and APPs and [phone] for Non- Physician Clinicians)- Only use minutes on the phone to determine level of service.    Video visits [phone]) - Use either level of medical decision making just as an in-person visit OR time which includes both minutes on video and pre/post minutes to determine the level of service.      :75688}  The patient reports they are physically located in [place] and is currently: {patient location:[address]90}. I conducted a audio/video visit. I spent {video YTKZ:60109323} on the video call with the patient. I spent an additional *** minutes on pre- and post-visit activities on the date of service.    I personally spent *** minutes face-to-face and non-face-to-face in the care of this patient, which includes all pre, intra, and post visit time on the date of service.  All documented time was specific to the E/M visit and does not include any procedures that may have been performed.    Ewing Schlein, MD  09/29/2023

## 2023-10-01 MED ORDER — VENTOLIN HFA 90 MCG/ACTUATION AEROSOL INHALER
0 refills | 0.00 days
Start: 2023-10-01 — End: ?

## 2023-10-01 NOTE — Unmapped (Signed)
[facility] Family Medicine  Care Management Progress Note               Purpose of contact: PCS    Patient's [employer] care manager, Rufina Falco, called to find out if SW has heard back about PCS form submitted on 1/6; SW let her know SW has not and would appreciate her finding out internally if [facility] accepted the [facility] form.       Shea Stakes, CCM  [facility] Manager  [facility] Family Medicine  [phone]

## 2023-10-01 NOTE — Unmapped (Addendum)
Medications for quitting smoking:  Nicotine patch and gum    COPD medications:  STOP Anoro Ellipta (red cap)  START Trelegy Ellipta 1 puff daily - use QUICK and deep breath, rinse mouth after each dose!  Albuterol 1-2 puffs every 4-6 hours as needed for shortness of breath or wheezing - SLOW and deep breath, separate puffs by around 1 minute    Blood pressure medications:  Amlodipine 1 tablet (10 mg) daily    Diabetes medications:  Trulicity 3 mg injection once weekly on Sundays - INCREASE!  Lantus 30 units once daily  Jardiance 1 tablet (25 mg) daily    How often and when to check blood sugar:  Use the Freestyle Libre 3 CGM Reader as instructed and change sensors every 14 days.    How to treat low blood sugar:  For blood sugar less than 70 --> Treat with 4 ounces of juice or regular soda, or with 3 to 4 glucose tablets. Re-check blood sugar in 15 minutes.    If blood sugar is still less than 70 on re-check, treat again and re-check in 15 minutes.   If blood sugar is over 70 on re-check and it is time to eat your regular meal, eat regular meal and take insulin as prescribed for that blood sugar.    When to follow-up:  Thursday, February 20th at 9am with Tobi Bastos (pharmacist)      [name], PharmD, CPP, [facility]  Family Medicine Clinical Pharmacist  Clinic Phone: [phone]

## 2023-10-01 NOTE — Unmapped (Signed)
Subjective     Reason for visit:    Tonya Wood is a 58 y.o. female with a history of diabetes (type 2), COPD, HTN, schizoaffective disorder, and tobacco use who presents today for a diabetes pharmacotherapy visit.  Patient presents to this visit alone.    Known DM Complications: no known complications    Date of Last Diabetes Related Visit: 09/04/23 with CPP, 09/12/23 with PCP (McConner)    Action At Last Diabetes Related Visit:    Continue Trulicity 1.5 mg SubQ weekly  Continue Lantus 30 units daily  Continue Jardiance 25 mg daily  Stop Stiolto Respimat 2 puffs daily  Start Anoro Ellipta 1 puff daily (pt prefers this inhaler device)  Continue nicotine 21 mg/24h patch + 4 mg gum PRN  Continue ezetimibe 10 mg daily    Since Last visit / History of Present Illness:    Patient reports fully implementing plan from last visit. Not feeling well today and was debating not coming to visit but ultimately decided to still come primarily to have someone help her complete NCLIFTS form. She was also under the impression that the deadline for her to submit is 1/18 so she didn't want to miss that.    Otherwise, complains of severe tooth pain but has had trouble getting a timely appointment with an oral surgeon. Currently has appt with oral surgeon on 2/12, which was the soonest she could get.    Did not bring CGM reader to visit today so unable to review. Was having low BGs for ~2 weeks straight last month without clear etiology but now has had more BG readings on the high side. Prefers to defer A1c check to next visit if possible.    Confirms stopping Stiolto and using Anoro Ellipta inhaler now. Reports some improvements in breathing, although not significant. Feels breathing was best when she was on Advair in the past. Admits to recurrent thrush infections on Trelegy being due to her forgetting to rinse her mouth out after using her inhaler.    Back to smoking ~3 cigarettes per day due to stress at home. Not ready to quit at this time.    Denies dizziness or other s/sx orthostasis. Feels BP has been well controlled lately.    Reported DM Regimen:  Lantus 30 units nightly  Trulicity 1.5 mg weekly (Sundays)  Jardiance 25 mg daily    DM medications tried in the past:   Metformin - pt doesn't like it so is not willing to take  Novolog - decreased insulin needs    Medication Adherence and Access:  Since last visit, patient denies missing doses of medications recently (including insulin). At last visit, reported using new system of marking down in a paper log book when she takes all meds so she know she doesn't miss or double up on them. Note she has had intermittent non-adherence in the recent past with periods of severe pain.    Using weekly pillbox, which currently is being filled and managed by brother.    Pt had [place] [facility] in the past but now has active Medicaid.    SMBG  N/A :  Using Libre 3 CGM as instructed. Did not bring CGM reader today so unable to provide data from CGM reports. As noted above, had a couple weeks of lows but more recently has had more highs. Reports highest glucose reading in the past week was >300.    Pt also notes she did not get the correct brand of  lancets last time she was prescribed this so requests new Rx for True Metrix brand so she can perform fingerstick BG checks if needed in the future.    Hypoglycemia:    Symptoms of hypoglycemia since last visit: yes  If yes, it was treated by:  unknown    DM-Related Prevention:  Statin: Not taking (stopped statin due to concern for causing elevated CK);  previously on atorvastatin 40 mg (high intensity) , now on ezetimibe 10 mg daily  Aspirin: unclear if indicated (pt reports history of CVA that occurred prior to seeking care at [facility] so not in recent health records); Taking    ACEI/ARB: Not taking (previously on lisinopril 20 mg but c/f dry cough and ACEi stopped 06/2023); Urine MA/CR Ratio - normal (last checked 01/01/23).  Last eye exam: 04/24/20 - DUE  Last foot exam: 01/01/23  Tobacco Use: Current smoker  Immunizations:   Immunization History   Administered Date(s) Administered    COVID-19 VAC,BIVALENT(22YR UP),PFIZER 08/08/2021    COVID-19 VAC,MRNA,TRIS(12Y UP)(PFIZER)(GRAY CAP) 01/28/2020, 02/18/2020    COVID-19 VACC,MRNA,(PFIZER)(PF) 01/28/2020, 02/18/2020, 08/21/2020    Covid-19 Vac, (41yr+) (Comirnaty) Mrna Pfizer  07/02/2023    Covid-19 Vac, (91yr+) (Spikevax) Monovalent Moderna 08/01/2022    Covid-19 Vacc, Unspecified 01/28/2020, 02/18/2020, 08/21/2020    Hep A / Hep B 09/27/2003, 10/28/2003, 10/17/2006    INFLUENZA TIV (TRI) 76MO+ W/ PRESERV (IM) 02/25/2008, 07/11/2010    INFLUENZA TIV (TRI) PF (IM)(HISTORICAL) 07/01/2013    INFLUENZA VACCINE IIV3(IM)(PF)6 MOS UP 07/02/2023    Influenza Vaccine Quad(IM)6 MO-Adult(PF) 06/09/2015, 01/08/2017, 07/22/2018, 06/30/2020, 07/17/2021, 05/21/2022    Influenza Virus Vaccine, unspecified formulation 06/30/2020    PPD Test 07/06/2014, 07/15/2014, 05/22/2016    Pneumococcal Conjugate 20-valent 12/28/2021    SHINGRIX-ZOSTER VACCINE (HZV),RECOMBINANT,ADJUVANTED(IM) 07/02/2023    TdaP 12/19/2011, 05/05/2019     _________________________________________________    Past Medical History: reviewed PMH in epic today    Social History:  Social History     Tobacco Use   Smoking Status Every Day    Current packs/day: 2.00    Average packs/day: 2.0 packs/day for 38.0 years (75.9 ttl pk-yrs)    Types: Cigarettes    Start date: 10/17/1985   Smokeless Tobacco Never       Medications: Medications reviewed in EPIC medication station and updated today by the clinical pharmacist practitioner.     Objective   Review of Systems:  Constitutional:  No fever, chills or unintentional weight loss  Cardiovascular:  No chest pain or pressure, shortness of breath, orthopnea or LE edema  Pulmonary:  No cough or SOB  GI:  No constipation, diarrhea, dyspepsia, change in bowel habits, nausea, abdominal pain  Endocrine: No polyuria, polyphagia, blurred vision    Physical Examination:  Vitals:    There were no vitals filed for this visit.     Wt Readings from Last 3 Encounters:   09/12/23 85.9 kg (189 lb 6.4 oz)   09/11/23 89.6 kg (197 lb 9.6 oz)   09/08/23 86.6 kg (191 lb)       There is no height or weight on file to calculate BMI.    The 10-year ASCVD risk score (Arnett DK, et al., 2019) is: 17.9%    Values used to calculate the score:      Age: 76 years      Sex: Female      Is Non-Hispanic African American: Yes      Diabetic: Yes      Tobacco smoker: Yes  Systolic Blood Pressure: 116 mmHg      Is BP treated: Yes      HDL Cholesterol: 41 mg/dL      Total Cholesterol: 152 mg/dL    Note: For patients with SBP <90 or >200, Total Cholesterol <130 or >320, HDL <20 or >100 which are outside of the allowable range, the calculator will use these upper or lower values to calculate the patient???s risk score.    mMRC:  Most recent MMRC dyspnea scale: 3 Last MMRC date: 05/13/2023    Labs:   Lab Results   Component Value Date    A1C 6.2 (H) 07/02/2023    A1C 5.7 (H) 01/01/2023     Lab Results   Component Value Date    CHOL 152 09/25/2022    TRIG 205 (H) 09/25/2022    HDL 41 09/25/2022    LDL 70 09/25/2022           Assessment/Plan:    1. Diabetes, type 2: controlled per last A1c of 6.2% (07/02/23). Goal <7% per ADA guidelines. Unable to assess CGM data without CGM reader today and per pt report seems glucose readings have been somewhat variable with a couple weeks of lows last month and now more frequent hyperglycemia. Since Trulicity has minimal to no risk of hypoglycemia and pt has tolerated higher doses in the past, agreed to increase today even though we don't have confirmed glucose readings from recent weeks. Suspect we may be able to taper insulin in the future again so hope to determine this at next visit as long as pt has CGM reader at that time.  Increase Trulicity from 1.5 to 3 mg SubQ once weekly  Continue Lantus 30 units daily  Continue Jardiance 25 mg daily  Repeat A1c due between Jan and April 2025 - plan to check at next visit (deferred today per pt preference)  Reviewed symptoms and treatment of hypoglycemia, including need to check BG prior to and following treatment.  SMBG instructions:  use Libre 3 CGM as instructed (change sensors every 14 days)  Sent new Rx for lancets with instructions to dispense True Metrix brand per pt preference  Future considerations:  Taper insulin as indicated  Room to titrate Trulicity back up if tolerated (on lower dose after intermittent issues with drug shortages earlier in 2024)    2. Mild COPD (FEV1 >80% on PFTs 04/2022): pt reports only slight improvement in breathing since switching inhaler devices from Stiolto Respimat to Anoro Ellipta at last CPP visit. Still feel pt may benefit from escalation to triple therapy given exacerbation history in the past year and she agrees given better controlled on Advair in the past per her report and suspect recurrent thrush primarily because pt was not rinsing mouth out after use of inhaler. Emphasized importance of rinsing mouth out after ICS-containing inhaler and agreed to escalate inhaler regimen further to Trelegy Ellipta (given pt comfortable w/ this device already).  Stop Anoro Ellipta 1 puff daily  Start Trelegy Ellipta 100-62.5-25 mcg 1 puff once daily  Reviewed importance of rinsing mouth out after every dose to prevent thrush  Continue albuterol HFA inhaler (w/ spacer) q4-6h PRN    3. Smoking cessation: pt reports working to reduce # of cigarettes per day but has not yet quit, recently has increased smoking due to stressors at home. Intermittently using NRT patch + gum which do help. Will continue to hold off on stepping down nicotine patch dose since pt still smoking occasionally, but will  reconsider once she is ready to quit smoking cigarettes altogether.  Continue nicotine 21 mg/24h patch daily + 4 mg gum PRN  Consider stepping down patch to 14 mg/24h dose as able in the future  Sent updated Rx for nicotine 4 mg gum per pt request    4. ASCVD Prevention: moderate to high risk based on 10-year ASCVD risk score. No longer on high intensity statin due to some concern it was contributing to elevated CK levels, but on ezetimibe since June 2023. Last lipid panel with LDL 70 (09/25/22) and improved from 123 in Oct 2023. Goal LDL <70 per 2022 ACC ECDP given DM + ASCVD risk >7.5%. Since LDL essentially at goal, will not pursue any additional non-statin LDL lowering therapies at this time.  Continue ezetimibe 10 mg daily    5. Hypertension: controlled based on last clinic BP of 116/64 (09/23/23). Goal <130/80 per ADA guidelines. No further med changes warranted at this time.  Continue amlodipine 10 mg daily  Future considerations:  Resume ARB (possible cough with ACEi) if additional BP lowering needed and choose agent that comes in combo w/ amlodipine if able  May also consider addition of thiazide diuretic if additional BP lowering needed on max doses of other first line agents    6. Medication management and care coordination:  Sent message to [facility] Meghan about pt request for assistance completing NCLIFTS form. SW Ronaldo Miyamoto was able to assist pt today before pt left and held onto paperwork to give to pt's primary CM Meghan in the coming days.        Follow-up: end of January with PCP as scheduled and ~1 month with CPP    Future Appointments   Date Time Provider Department Center   10/08/2023 To Be Determined Graciella Freer, OT [facility]   10/14/2023  8:00 AM Karle Starch, MD [facility]   10/17/2023  4:05 PM McConner, Maryln Manuel, MD [facility]   10/29/2023  9:00 AM Penelope Galas, MD/DMD [facility]   11/06/2023  9:00 AM Melba Coon, CPP [facility]   11/24/2023  9:00 AM Alvino Chapel, Hee Jae, DO [facility]   12/01/2023  9:00 AM [facility] [place] [facility] 1 [facility] [place] [facility]   01/21/2024 11:20 AM Kelle Darting, MD [facility]       I spent a total of 30 minutes face to face with the patient delivering clinical care and providing education/counseling.    _________________________________________________    [name], PharmD, CPP, [facility]  Family Medicine Clinical Pharmacist

## 2023-10-02 ENCOUNTER — Ambulatory Visit
Admit: 2023-10-02 | Discharge: 2023-10-03 | Payer: PRIVATE HEALTH INSURANCE | Attending: Ambulatory Care | Primary: Ambulatory Care

## 2023-10-02 MED ORDER — TRELEGY ELLIPTA 200 MCG-62.5 MCG-25 MCG POWDER FOR INHALATION
Freq: Every day | RESPIRATORY_TRACT | 0 refills | 0.00 days | Status: CN
Start: 2023-10-02 — End: ?

## 2023-10-02 MED ORDER — DULAGLUTIDE 3 MG/0.5 ML SUBCUTANEOUS PEN INJECTOR
SUBCUTANEOUS | 3 refills | 84.00 days | Status: CP
Start: 2023-10-02 — End: ?

## 2023-10-02 MED ORDER — NICOTINE (POLACRILEX) 4 MG GUM
ORAL | 3 refills | 5.00 days | Status: CP | PRN
Start: 2023-10-02 — End: ?

## 2023-10-02 MED ORDER — TRELEGY ELLIPTA 100 MCG-62.5 MCG-25 MCG POWDER FOR INHALATION
Freq: Every day | RESPIRATORY_TRACT | 3 refills | 180 days | Status: CP
Start: 2023-10-02 — End: ?

## 2023-10-02 MED ORDER — LANCETS
11 refills | 0.00 days | Status: CP
Start: 2023-10-02 — End: 2024-10-01

## 2023-10-02 NOTE — Unmapped (Signed)
Immediately after or during the visit, I reviewed with the resident the medical history and the resident’s findings on physical examination.  I discussed with the resident the patient’s diagnosis and concur with the treatment plan as documented in the resident note. Debi Cousin Louw Harshan Kearley, MD

## 2023-10-07 NOTE — Unmapped (Signed)
Patient Advice Request Patient Name: FRUMA AFRICA  Caller: Self (Patient)  Name of Caller: Lysa Livengood   Contact Method: Telephone Call: Time- Any Time [phone]   Reason for Call: Patient is requesting a call back. Patient states they have the sensor in their arm but the reader is not working. Patient also said the censor has went out and will not charge   Previously Discussed: no  Appointment Offered: No  If offered accepted, scheduled appt date: NA

## 2023-10-07 NOTE — Unmapped (Signed)
[facility] Family Medicine  Care Management Progress Note               Purpose of contact: CAP (in-home support through [facility])     SW faxed part of CAP paperwork to [facility] [employer] (fax# [phone]). SW put PCP part of CAPS form in PCP's box.    SW called patient and updated her.     Status of current referrals:  [x]  [facility] care manager: Patient has a Futures trader through [employer] [facility] [employer] [place], email address [email or web address], phone number [phone]. SW last spoke with her on 12/30  [x]  CAP/DA: (additional in-home assistance through [facility]): Patient part of application faxed by SW on 1/21. PCP part of application placed in PCP's box on 1/21.  [x]  Housing referrals: Patient lives with family members in [place]. SW gave information for Senior apartments in [place] on 12/18 and encouraged her to put in an application to get on their wait list.   [x]  Home heath: PT and OT through [facility].   [x]  HCPOA: SW gave patient a copy on 12/16; she said she has access to a notary. She would like her brother to be her HCPOA.  [x]  Medication administration concerns: Follow up with CPP [name] scheduled for 1/16  [x]  Neurology: Referral was made to [facility] Neurology at patient request; last appointment was on 12/10. Patient is scheduled with [facility] Neurology on 5/7  [x]  PCS referral: ([facility] Services are in-home aide assistance with ADLs, through [facility]): SW faxed forms in November 2024; [facility] notified SW on 09/16/23 that form is incomplete and they will not accept. Updated form faxed on 1/6 [facility] Manager was cc'ed).  [x]  Pain Management: Patient does not want to receive pain management with [facility]. She would like a referral to [facility] in [place]; they sent over forms to complete for patient to be seen at their clinic; SW completed and faxed back on 12/20.   [x]  Psychiatry: Patient was given information on crisis and walk in clinics. Appointment with [facility] Psychiatry on 1/14.    Shea Stakes, CCM  [facility] Manager  [facility] Family Medicine  [phone]

## 2023-10-08 NOTE — Unmapped (Signed)
I was the supervising physician in the delivery of this service Ulla Gallo, MD

## 2023-10-09 DIAGNOSIS — E1165 Type 2 diabetes mellitus with hyperglycemia: Principal | ICD-10-CM

## 2023-10-09 DIAGNOSIS — Z794 Long term (current) use of insulin: Principal | ICD-10-CM

## 2023-10-09 MED ORDER — FREESTYLE LIBRE 3 READER
Freq: Every day | 0 refills | 0.00 days
Start: 2023-10-09 — End: ?

## 2023-10-13 DIAGNOSIS — Z794 Long term (current) use of insulin: Principal | ICD-10-CM

## 2023-10-13 DIAGNOSIS — E1165 Type 2 diabetes mellitus with hyperglycemia: Principal | ICD-10-CM

## 2023-10-13 MED ORDER — FREESTYLE LIBRE 3 PLUS SENSOR DEVICE
3 refills | 0.00 days
Start: 2023-10-13 — End: 2024-10-12

## 2023-10-13 MED ORDER — FREESTYLE LIBRE 3 READER
Freq: Every day | 0 refills | 0.00 days
Start: 2023-10-13 — End: ?

## 2023-10-13 NOTE — Unmapped (Signed)
Medication Request Patient Name: Tonya Wood   Caller: Self (Patient)  Name of Caller:  Have you contacted your pharmacy? yes      Last seen in-person: 09/12/2023  Last telemedicine visit: Visit date not found        Medication Name: oxyCODONE (ROXICODONE)   Dosage:5 MG immediate release tablet   Route: Oral (PO)  Frequency: As Needed (PRN)  Day Supply Requested: 16  Pharmacy (Name & Address): [facility] [place], [place] - [address] [place] [address]     Pharmacy Phone Number: [phone]   Are there refills on medications? no  Has [facility] refilled this medication before? Yes

## 2023-10-14 ENCOUNTER — Ambulatory Visit
Admit: 2023-10-14 | Discharge: 2023-10-15 | Payer: PRIVATE HEALTH INSURANCE | Attending: Student in an Organized Health Care Education/Training Program | Primary: Student in an Organized Health Care Education/Training Program

## 2023-10-14 MED ORDER — OXYCODONE 5 MG TABLET
ORAL_TABLET | Freq: Three times a day (TID) | ORAL | 0 refills | 30.00 days | Status: CP | PRN
Start: 2023-10-14 — End: 2023-11-13

## 2023-10-14 NOTE — Unmapped (Signed)
[facility]  Established Patient Clinic Note    Assessment & Plan  Tonya Wood is a 58 y.o.female    Assessment & Plan  Need for shingles vaccine  Received 2nd Shingles vaccine today   Orders:    Zoster Vaccine - Recombinant,Adjuvanted    Chronic pain syndrome  Controlled Medication Agreement: yes, signed 06/18/2023 (see Media - Consents)     Sources of Pain: cervical spine, thoracic spine, lumbar spine, right shoulder, knees, dental; multiple sclerosis     Prior Opioid Tx: oxycodone 5mg  BID PRN severe pain  Current Opioid Tx: oxycodone 5mg  BID PRN severe pain not responsive to Tylenol/Motrin   Multimodal Tx: acetaminophen 1000mg  TID, ibuprofen 600mg  TID PRN with food, cyclobenzaprine 5mg  TID, duloxetine 60mg  daily, pregabalin 75mg  TID  Bowel Regimen: Miralax daily   Pain Management: no, have been attempting to establish with Pain Management. Recently informed that the Pain Clinic would not accept her insurance [facility]). Will inform Care Manager to assist with finding in-network Pain Clinics for a new referral to be placed.     Reviewed PDMP. Last PDMP Review: 10/14/2023  8:18 AM  Utox: last in 12/24 which was negative (ran out of oxycodone). Should obtain updated annual Utox at next in person visit.    Rx Provided: oxycodone 5mg  TID PRN severe pain not responsive to Tylenol/Motrin  - Note for Pharmacy to fill today, 1/28, as out of oxycodone and dose increased  Follow Up Appt: in 3 days with PCP     Orders:    oxyCODONE (ROXICODONE) 5 MG immediate release tablet; Take 1 tablet (5 mg total) by mouth every eight (8) hours as needed for pain (severe pain unresponsive to tylenol/ibuprofen).    Scanned copy of advance directives on file  Tonya Wood brought in a copy of her completed advance directives. Copy scanned into Media.        At high risk for falls  Most recent positive fall risk assessment date: 07/23/2023    At high risk of falls due to Multiple Sclerosis as well as multiple chronic pain conditions.               Subjective   Tonya Wood is a 58 y.o. female coming to clinic today for the following issues: Medication Refill (oxycodone)    History of Present Illness:    Shingles vaccine: needs  Medication Refill: oxycodone. She went to her Pain Clinic appt yesterday, waited 3 hours and was told that her insurance was not accepted. Recommended that she call her insurance company to receive a list of in network for insurance. Reports that completely out of oxycodone. Last night, had to take Motrin 800mg , took about 3 tablets. Of note, has had to take 3 oxycodone tablets daily. If she is out, especially in the cold, then experiencing more pain for which may have to take 4 oxycodone tablets. Unfortunately, her home does not have central heating; they heat by stove. Unable to use the electric heater.   Other Updates: Receiving HH. She has been using a wheelchair at home. Provided a copy of her Advance Directive for her PCP.    ROS:    Review of Systems   Musculoskeletal:  Positive for back pain, joint pain and neck pain.       I have reviewed past medical history, past surgical history, family history, social history, allergies, health maintenance/care gaps, medications, and problem list, and have updated/reconciled them as indicated.  Objective   BP 105/77 (BP Site: L Arm, BP Position: Sitting, BP Cuff Size: Medium)  - Pulse 91  - Temp 36.3 ??C (97.3 ??F) (Temporal)  - Wt 89.6 kg (197 lb 9.6 oz)  - BMI 30.94 kg/m??   Physical Exam  Constitutional:       Appearance: Normal appearance.   HENT:      Head: Normocephalic and atraumatic.      Right Ear: External ear normal.      Left Ear: External ear normal.      Nose: Nose normal.      Mouth/Throat:      Mouth: Mucous membranes are moist.   Eyes:      Conjunctiva/sclera: Conjunctivae normal.   Cardiovascular:      Rate and Rhythm: Normal rate.   Pulmonary:      Effort: Pulmonary effort is normal.   Musculoskeletal:      Comments: Ambulating with a rollator    Neurological:      General: No focal deficit present.      Mental Status: She is alert. Mental status is at baseline.       Labs & Imaging: Reviewed pertinent labs and imaging in Epic; see Assessment & Plan.   Procedure(s): No      [facility]  [address], [place], [place] [address]   Telephone [phone]  Fax [phone]  [email or web address]

## 2023-10-14 NOTE — Unmapped (Signed)
[facility] Family Medicine  Care Management Progress Note               Purpose of contact: Pain Management Referral    [facility] no longer takes patient's insurance.     SW called [facility] to find in-network pain clinic options - they were able to find two options: [facility] (patient has already been to [facility]) and [facility] (phone: [phone]).     SW called [facility] - they accept patient's insurance for pain management. SW faxed referral, note and demographics to fax: [phone]. They will call patients and first appointments are usually within 3 weeks.       Status of current referrals:  [x]  [facility] care manager: Patient has a Futures trader through [employer] [facility] [employer] [place], email address [email or web address], phone number [phone]. SW last spoke with her on 12/30  [x]  CAP/DA: (additional in-home assistance through [facility]): Patient part of application faxed by SW on 1/21. PCP part of application placed in PCP's box on 1/21.  [x]  Housing referrals: Patient lives with family members in [place]. SW gave information for Senior apartments in [place] on 12/18 and encouraged her to put in an application to get on their wait list.   [x]  Home heath: PT and OT through [facility].   [x]  HCPOA: SW gave patient a copy on 12/16; she said she has access to a notary. She would like her brother to be her HCPOA.  [x]  Medication administration concerns: Follow up with CPP [name] scheduled for 1/16  [x]  Neurology: Referral was made to [facility] Neurology at patient request; last appointment was on 12/10. Patient is scheduled with [facility] Neurology on 5/7  [x]  PCS referral: (Personal Care Services are in-home aide assistance with ADLs, through [facility]): SW faxed forms in November 2024; [facility] notified SW on 09/16/23 that form is incomplete and they will not accept. Updated form faxed on 1/6 [facility] Manager was cc'ed).  [x]  Pain Management: Referral faxed to [facility] on 10/14/23 (phone: [phone], fax: [phone]).  [x]  Psychiatry: Patient was given information on crisis and walk in clinics. Appointment with [facility] Psychiatry on 1/14.      Shea Stakes, CCM  [facility] Manager  [facility] Family Medicine  [phone]

## 2023-10-14 NOTE — Unmapped (Addendum)
Controlled Medication Agreement: yes, signed 06/18/2023 (see Media - Consents)     Sources of Pain: cervical spine, thoracic spine, lumbar spine, right shoulder, knees, dental; multiple sclerosis     Prior Opioid Tx: oxycodone 5mg  BID PRN severe pain  Current Opioid Tx: oxycodone 5mg  BID PRN severe pain not responsive to Tylenol/Motrin   Multimodal Tx: acetaminophen 1000mg  TID, ibuprofen 600mg  TID PRN with food, cyclobenzaprine 5mg  TID, duloxetine 60mg  daily, pregabalin 75mg  TID  Bowel Regimen: Miralax daily   Pain Management: no, have been attempting to establish with Pain Management. Recently informed that the Pain Clinic would not accept her insurance [facility]). Will inform Care Manager to assist with finding in-network Pain Clinics for a new referral to be placed.     Reviewed PDMP. Last PDMP Review: 10/14/2023  8:18 AM  Utox: last in 12/24 which was negative (ran out of oxycodone). Should obtain updated annual Utox at next in person visit.    Rx Provided: oxycodone 5mg  TID PRN severe pain not responsive to Tylenol/Motrin  - Note for Pharmacy to fill today, 1/28, as out of oxycodone and dose increased  Follow Up Appt: in 3 days with PCP     Orders:    oxyCODONE (ROXICODONE) 5 MG immediate release tablet; Take 1 tablet (5 mg total) by mouth every eight (8) hours as needed for pain (severe pain unresponsive to tylenol/ibuprofen).

## 2023-10-14 NOTE — Unmapped (Addendum)
Most recent positive fall risk assessment date: 07/23/2023    At high risk of falls due to Multiple Sclerosis as well as multiple chronic pain conditions.

## 2023-10-16 DIAGNOSIS — G35 Multiple sclerosis: Principal | ICD-10-CM

## 2023-10-16 MED ORDER — DIMETHYL FUMARATE 240 MG CAPSULE,DELAYED RELEASE
ORAL_CAPSULE | Freq: Two times a day (BID) | ORAL | 1 refills | 90.00 days | Status: CP
Start: 2023-10-16 — End: ?

## 2023-10-16 MED ORDER — ALBUTEROL SULFATE HFA 90 MCG/ACTUATION AEROSOL INHALER
Freq: Four times a day (QID) | RESPIRATORY_TRACT | 1 refills | 25.00 days | Status: CP | PRN
Start: 2023-10-16 — End: 2024-10-15

## 2023-10-16 NOTE — Unmapped (Signed)
[facility] Refill Coordination Note    Specialty Medication(s) to be Shipped:   Neurology: Dimethyl fumarate     Other medication(s) to be shipped:  Ventolin       Tonya Wood, DOB: [birth year]  Phone: [phone] (home)       All above HIPAA information was verified with patient.     Was a Nurse, learning disability used for this call? No    Completed refill call assessment today to schedule patient's medication shipment from the [facility] [phone]).  All relevant notes have been reviewed.     Specialty medication(s) and dose(s) confirmed: Regimen is correct and unchanged.   Changes to medications: Tonya Wood reports no changes at this time.  Changes to insurance: No  New side effects reported not previously addressed with a pharmacist or physician: None reported  Questions for the pharmacist: No    Confirmed patient received a Conservation officer, historic buildings and a Surveyor, mining with first shipment. The patient will receive a drug information handout for each medication shipped and additional FDA Medication Guides as required.       DISEASE/MEDICATION-SPECIFIC INFORMATION        N/A    SPECIALTY MEDICATION ADHERENCE     Medication Adherence    Patient reported X missed doses in the last month: >5  Specialty Medication: dimethyl fumarate 240 mg Cpdr  Patient is on additional specialty medications: No              Were doses missed due to medication being on hold? No         dimethyl fumarate 240 mg Cpdr: a week or less of medicine on hand        REFERRAL TO PHARMACIST     Referral to the pharmacist: Not needed      [facility]     Shipping address confirmed in Epic.       Delivery Scheduled: Yes, Expected medication delivery date: 10/22/23.  However, Rx request for refills was sent to the provider as there are none remaining.     Medication will be delivered via Same Day Courier to the prescription address in Epic WAM.    Dan Europe   [facility] Specialty Technician

## 2023-10-16 NOTE — Unmapped (Signed)
Request received via interface.     Provider: Yolande Jolly, PA    Last Visit Date: 07/23/2023  Next Visit Date: Visit date not found    Lab Results   Component Value Date    Hep B Surface Ag Nonreactive 12/28/2021    Hep B S Ab Reactive (A) 12/28/2021    Hep B Surf Ab Quant 16.06 (H) 12/28/2021    Hep B Core Total Ab Nonreactive 10/16/2022    Hepatitis C Ab Nonreactive 10/16/2022    HIV Antigen/Antibody Combo Nonreactive 12/28/2021        No results found for this or any previous visit.      Results for orders placed during the hospital encounter of 04/09/22    MRI Thoracic Spine Wo Contrast    Narrative  EXAM: Magnetic resonance imaging, spinal canal and contents, thoracic, without contrast material.  DATE: 04/09/2022 10:47 AM  ACCESSION: [id number]  DICTATED: 04/09/2022 12:49 PM  INTERPRETATION LOCATION: [facility]    CLINICAL INDICATION: 58 years old Female with Eval for myelopathy ; Myelopathy, chronic, thoracic spine  - M54.50 - Chronic bilateral low back pain without sciatica - G89.29 - Chronic bilateral low back pain without sciatica    COMPARISON: Lumbar spine MRI of 03/25/2022.    TECHNIQUE: Multiplanar MRI was performed through the thoracic spine without contrast administration    FINDINGS:  Bone marrow signal intensity is normal. Normal signal in the spinal cord.    The vertebral bodies are normally aligned. Diffuse mild disc desiccation. Disc spaces are preserved. No significant spinal canal or neural foraminal narrowing.    The paraspinal tissues are within normal limits.    Impression  --Mild degenerative changes of the thoracic spine.    -- No imaging evidence for myelopathy as clinically questioned.      Results for orders placed during the hospital encounter of 05/03/22    MRI Cervical Spine Wo Contrast    Narrative  EXAM: Magnetic resonance imaging, spinal canal and contents, cervical without contrast material.  DATE: 05/03/2022 5:42 PM  ACCESSION: [id number]  DICTATED: 05/04/2022 9:07 AM  INTERPRETATION LOCATION: [facility]    CLINICAL INDICATION: 58 years old Female with Eval for myelopathy  - M54.50 - Chronic bilateral low back pain without sciatica - G89.29 - Chronic bilateral low back pain without sciatica. Possible cervical myelopathy with gait imbalance, hand numbness, and difficulty with fine motor skills.    COMPARISON: Cervical spinal radiograph 02/25/2022    TECHNIQUE: Multiplanar multisequence MRI was performed through the cervical spine without intravenous contrast.    FINDINGS:  Exam is moderately limited due to motion artifact/image degradation on the first set of T2/space images and their axial reconstructions.    Bone marrow signal intensity is normal. Patchy T2 hyperintense lesion within the right hemicord at the level of C3 (7:15 and 6:10). Likely additional lesion in the anterior mid pons is incompletely evaluated.    Trace multilevel listhesis, likely degenerative. The vertebral bodies are otherwise normally aligned. Mild disc desiccation throughout the cervical spine with mild loss of disc height most prominent from C4 through C6. There is also slight loss of vertebral body height at C5 and C6. Multilevel discogenic degenerative changes as follows:    C2-C3: Small disc bulge with minimal canal narrowing. No neural foraminal narrowing.    C3-C4: Small disc bulge with mild canal narrowing. No neural foraminal narrowing.    C4-C5: Posterior disc osteophyte with moderate canal narrowing and slight effacement of the cord. Although  exam is limited by image degradation, as above, there appears to be severe right and mild-to-moderate left neural foraminal narrowing.    C5-C6: Small disc bulge with a superimposed left subarticular disc protrusion with mild canal narrowing. Again, within limitations, there appears to be at least mild to moderate bilateral neural foraminal narrowing.    C6-C7: Small disc bulges with minimal canal narrowing. No definite neural foraminal narrowing.    C7-T1: No spinal canal narrowing. No neural foraminal narrowing.    The paraspinal tissues are within normal limits.    Impression  -- Patchy T2 hyperintense lesion within the right cord at the level of C3. Given patient's symptoms and demographics, this finding is suspicious for demyelinating plaque of multiple sclerosis. Lesion in the pons is incompletely evaluated on this cervical spine exam, but could also be compatible with demyelinating disease. Consider dedicated brain MRI for further evaluation.    -- Moderate cervical spondylosis, including mild/moderate canal narrowing, worst at C4-C5. Varying degrees of neural foraminal narrowing, worst at C4-C6.      FOLLOW-UP RECOMMENDATION:    Item for Follow Up:  1. Acuity: Subacute  2. Modality: MR  3. Anatomy: Head  4. TimeFrame: 1-2 weeks

## 2023-10-17 ENCOUNTER — Ambulatory Visit: Admit: 2023-10-17 | Discharge: 2023-10-18 | Payer: PRIVATE HEALTH INSURANCE

## 2023-10-17 DIAGNOSIS — E1159 Type 2 diabetes mellitus with other circulatory complications: Principal | ICD-10-CM

## 2023-10-17 DIAGNOSIS — Z794 Long term (current) use of insulin: Principal | ICD-10-CM

## 2023-10-17 LAB — TOXICOLOGY SCREEN, URINE
AMPHETAMINE SCREEN URINE: NEGATIVE
BARBITURATE SCREEN URINE: NEGATIVE
BENZODIAZEPINE SCREEN, URINE: NEGATIVE
BUPRENORPHINE, URINE SCREEN: NEGATIVE
CANNABINOID SCREEN URINE: NEGATIVE
COCAINE(METAB.)SCREEN, URINE: NEGATIVE
FENTANYL SCREEN, URINE: NEGATIVE
METHADONE SCREEN, URINE: NEGATIVE
OPIATE SCREEN URINE: POSITIVE — AB
OXYCODONE SCREEN URINE: POSITIVE — AB

## 2023-10-17 LAB — BASIC METABOLIC PANEL
ANION GAP: 12 mmol/L (ref 5–14)
BLOOD UREA NITROGEN: 14 mg/dL (ref 9–23)
BUN / CREAT RATIO: 21
CALCIUM: 9.6 mg/dL (ref 8.7–10.4)
CHLORIDE: 102 mmol/L (ref 98–107)
CO2: 28 mmol/L (ref 20.0–31.0)
CREATININE: 0.67 mg/dL (ref 0.55–1.02)
EGFR CKD-EPI (2021) FEMALE: >90 mL/min/{1.73_m2} (ref >=60)
GLUCOSE RANDOM: 75 mg/dL (ref 70–179)
POTASSIUM: 3.8 mmol/L (ref 3.4–4.8)
SODIUM: 142 mmol/L (ref 135–145)

## 2023-10-17 LAB — HEMOGLOBIN A1C
ESTIMATED AVERAGE GLUCOSE: 151 mg/dL
HEMOGLOBIN A1C: 6.9 % — ABNORMAL HIGH (ref 4.8–5.6)

## 2023-10-17 MED ORDER — LANTUS SOLOSTAR U-100 INSULIN 100 UNIT/ML (3 ML) SUBCUTANEOUS PEN
Freq: Every evening | SUBCUTANEOUS | 2 refills | 50.00 days | Status: CP
Start: 2023-10-17 — End: ?

## 2023-10-17 MED ORDER — FREESTYLE LIBRE 3 READER
Freq: Every day | 0 refills | 0.00 days | Status: CP
Start: 2023-10-17 — End: ?

## 2023-10-17 NOTE — Unmapped (Signed)
Hi Tonya Wood,    It was great to see you today. I look forward to seeing you at your next visit. In the meantime, for non-urgent questions, please message our clinic on MyChart. I am typically able to respond to these messages within 3-5 business days. For more urgent matters, please call our clinic at [phone]. In the event of a medical emergency, call 911 and/or visit our urgent care or an emergency room.     Take care,    Donia Ast, MD  He, him, his  Resident Physician, PGY-2    [facility]   [address]   [place], [place] [address]   [phone] (Phone); [phone] (Fax)     [facility] MyChart is for non-urgent messages. I do my best to respond within 3 business days, however occasionally it may take longer. If you have immediate concerns, contact our clinic by phone [phone].      [facility] has an Urgent Care!  Family Medicine Urgent Care Hours: Monday-Friday 7am-8pm; Saturday and Sunday 12pm-5pm   If you think you are having an emergency, please call 911 or go to your nearest emergency department.
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
## 2023-10-20 MED ORDER — VENTOLIN HFA 90 MCG/ACTUATION AEROSOL INHALER
0 refills | 0.00 days
Start: 2023-10-20 — End: ?

## 2023-10-21 ENCOUNTER — Emergency Department
Admit: 2023-10-21 | Discharge: 2023-10-22 | Disposition: A | Discharge status: Home / Self Care | Payer: PRIVATE HEALTH INSURANCE | Attending: Student in an Organized Health Care Education/Training Program

## 2023-10-21 DIAGNOSIS — R052 Subacute cough: Principal | ICD-10-CM

## 2023-10-21 DIAGNOSIS — R0789 Other chest pain: Principal | ICD-10-CM

## 2023-10-21 DIAGNOSIS — H1033 Unspecified acute conjunctivitis, bilateral: Principal | ICD-10-CM

## 2023-10-21 LAB — COMPREHENSIVE METABOLIC PANEL
ALBUMIN: 4.2 g/dL (ref 3.4–5.0)
ALKALINE PHOSPHATASE: 104 U/L (ref 46–116)
ALT (SGPT): 16 U/L (ref 10–49)
ANION GAP: 14 mmol/L (ref 5–14)
AST (SGOT): 16 U/L (ref <=34)
BILIRUBIN TOTAL: 0.4 mg/dL (ref 0.3–1.2)
BLOOD UREA NITROGEN: 14 mg/dL (ref 9–23)
BUN / CREAT RATIO: 25
CALCIUM: 10.3 mg/dL (ref 8.7–10.4)
CHLORIDE: 103 mmol/L (ref 98–107)
CO2: 28.3 mmol/L (ref 20.0–31.0)
CREATININE: 0.56 mg/dL (ref 0.55–1.02)
EGFR CKD-EPI (2021) FEMALE: >90 mL/min/{1.73_m2} (ref >=60)
GLUCOSE RANDOM: 156 mg/dL (ref 70–179)
POTASSIUM: 4.2 mmol/L (ref 3.4–4.8)
PROTEIN TOTAL: 7.6 g/dL (ref 5.7–8.2)
SODIUM: 145 mmol/L (ref 135–145)

## 2023-10-21 LAB — CBC W/ AUTO DIFF
BASOPHILS ABSOLUTE COUNT: 0 10*9/L (ref 0.0–0.1)
BASOPHILS RELATIVE PERCENT: 1 %
EOSINOPHILS ABSOLUTE COUNT: 0.1 10*9/L (ref 0.0–0.5)
EOSINOPHILS RELATIVE PERCENT: 2.1 %
HEMATOCRIT: 43.8 % (ref 34.0–44.0)
HEMOGLOBIN: 14.6 g/dL (ref 11.3–14.9)
LYMPHOCYTES ABSOLUTE COUNT: 0.7 10*9/L — ABNORMAL LOW (ref 1.1–3.6)
LYMPHOCYTES RELATIVE PERCENT: 23.1 %
MEAN CORPUSCULAR HEMOGLOBIN CONC: 33.4 g/dL (ref 32.0–36.0)
MEAN CORPUSCULAR HEMOGLOBIN: 29.7 pg (ref 25.9–32.4)
MEAN CORPUSCULAR VOLUME: 88.9 fL (ref 77.6–95.7)
MEAN PLATELET VOLUME: 8.1 fL (ref 6.8–10.7)
MONOCYTES ABSOLUTE COUNT: 0.4 10*9/L (ref 0.3–0.8)
MONOCYTES RELATIVE PERCENT: 13.7 %
NEUTROPHILS ABSOLUTE COUNT: 1.8 10*9/L (ref 1.8–7.8)
NEUTROPHILS RELATIVE PERCENT: 60.1 %
NUCLEATED RED BLOOD CELLS: 0 /100{WBCs} (ref <=4)
PLATELET COUNT: 226 10*9/L (ref 150–450)
RED BLOOD CELL COUNT: 4.93 10*12/L (ref 3.95–5.13)
RED CELL DISTRIBUTION WIDTH: 13.5 % (ref 12.2–15.2)
WBC ADJUSTED: 3 10*9/L — ABNORMAL LOW (ref 3.6–11.2)

## 2023-10-21 LAB — PRO-BNP: PRO-BNP: <35 pg/mL (ref <=300.0)

## 2023-10-21 LAB — MAGNESIUM: MAGNESIUM: 1.9 mg/dL (ref 1.6–2.6)

## 2023-10-21 LAB — HIGH SENSITIVITY TROPONIN I - SINGLE: HIGH SENSITIVITY TROPONIN I: <3 ng/L (ref <=34)

## 2023-10-21 LAB — LIPASE: LIPASE: 45 U/L (ref 12–53)

## 2023-10-21 LAB — PHOSPHORUS: PHOSPHORUS: 3.6 mg/dL (ref 2.4–5.1)

## 2023-10-21 MED ORDER — POLYMYXIN B SULFATE 10,000 UNIT-TRIMETHOPRIM 1 MG/ML EYE DROPS
Freq: Four times a day (QID) | OPHTHALMIC | 0 refills | 10.00 days | Status: CP
Start: 2023-10-21 — End: 2023-10-28
  Filled 2023-10-22: qty 10, 25d supply, fill #0

## 2023-10-21 MED ORDER — ONDANSETRON 4 MG DISINTEGRATING TABLET
ORAL_TABLET | Freq: Three times a day (TID) | 0 refills | 5.00 days | Status: CP | PRN
Start: 2023-10-21 — End: 2023-10-28
  Filled 2023-10-22: qty 14, 5d supply, fill #0

## 2023-10-21 MED ADMIN — dextromethorphan-guaiFENesin (ROBITUSSIN-DM) 2-20 mg/mL oral syrup: 5 mL | ORAL | @ 23:00:00 | Stop: 2023-10-21

## 2023-10-21 MED ADMIN — ondansetron (ZOFRAN-ODT) disintegrating tablet 4 mg: 4 mg | ORAL | @ 23:00:00 | Stop: 2023-10-21

## 2023-10-21 NOTE — Unmapped (Signed)
Pt stating she was sent from [facility]. Pt stating hx of MS. Pt stating she has been coughing up green and has ongoing CP. Pt stating she has not been taking her normal medication the last 3 days due to nausea. Fevers two days ago. Recently around people who had norovirus.

## 2023-10-21 NOTE — Unmapped (Signed)
[facility]  Emergency Department Provider Note      ED Clinical Impression       Diagnosis ICD-10-CM Associated Orders   1. Subacute cough  R05.2       2. Discomfort in chest  R07.89       3. Acute conjunctivitis of both eyes, unspecified acute conjunctivitis type  H10.33             Medical Decision Making, Progress Notes and Critical Care      Differential Diagnosis and Plan of Care    Differential diagnosis includes viral syndrome vs multiple sclerosis flare-up, blepharitis. Less likely bacterial conjunctivitis pneumonia, pneumothorax, ACS, pulmonary embolism, and aortic dissection.     Plan: EKG, basic labs, 4plex, RPP, BNP, lipase, hsTroponin, and CXR. Will give patient Zofran and Robitussin.     ED Course as of 10/21/23 1924   Tue Oct 21, 2023   1908 CXR with sharp costophrenic angles no focal opacities no overt mediastinal widening.  No pneumothorax     Patient is a 58 year old female PMH of MS, DMT2, HTN, HLD, COPD who presents with cough chest discomfort and eye discomfort.  She describes days of nausea and emesis and has known sick contacts in children and family members.  She has upper respiratory symptoms and cough that began wet but is now dry.  Chest discomfort is only with coughing.  She has new bilateral eye discomfort with crusting and discharge.    Vital signs grossly unremarkable.  Sickle exam she has intermittent cough that is nonproductive.  Clear lung fields.  There is no overt injection of conjunctiva.  Viral 4 Plex is negative.  RPP is sent and patient notified results via MyChart.  Chest x-ray without overt signs of pneumonia.  ECG without acute findings.  Laboratory results without overt electrolyte abnormalities or significant blood count disparities.  Patient provided Polytrim eyedrops and instructed to use OTC methods for cough.  Strict ED return precautions are discussed.    Additional MDM Elements         Discussion with other professionals: None  Independent interpretation: X-ray(s) - CXR  I have reviewed recent and relavant previous record, including: Outpatient notes - 10/17/2023 [facility] Visit Note - for review of patient's past medical history    Social Determinants that significantly affected care: None         Portions of this record have been created using Scientist, clinical (histocompatibility and immunogenetics). Dictation errors have been sought, but may not have been identified and corrected.    See chart and nursing documentation for additional ED course details.    ____________________________________________         History      Reason for Visit  Cough    HPI   Tonya Wood is a 58 y.o. female with past medical history of T2DM, HTN, HLD, COPD, hypothyroidism, MS (on dimethyl fumarate), tobacco use disorder, PTSD, schizoaffective disorder, depression, and chronic pain syndrome who presents to the ED for evaluation of cough. The patient states she has had 3 days of shortness of breath and multiple NBNB emesis episodes, with associated subjective fevers and chills. She also reports since last night, she has had an unproductive cough, and notes chest pain 2/2 her cough. She has had recent contact with family members who have tested positive for norovirus. She also reports a bilateral eye infection and states she has had increased eye discharge. She reports she wakes up with her eyes stuck  together due to the discharge. Denies diarrhea.     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    Current Outpatient Medications:     acetaminophen (TYLENOL EXTRA STRENGTH) 500 MG tablet, Take 2 tablets (1,000 mg total) by mouth three (3) times a day., Disp: 540 tablet, Rfl: 3    albuterol 2.5 mg /3 mL (0.083 %) nebulizer solution, Inhale 1 vial (2.5 mg total) by nebulization every four (4) hours., Disp: 540 mL, Rfl: 0    albuterol HFA 90 mcg/actuation inhaler, Inhale 2 puffs by mouth every six (6) hours as needed., Disp: 18 g, Rfl: 1    amantadine HCL (SYMMETREL) 100 mg capsule, Take 2 capsules (200 mg total) by mouth two (2) times a day., Disp: 360 capsule, Rfl: 1    amlodipine (NORVASC) 10 MG tablet, Take 1 tablet (10 mg total) by mouth daily., Disp: 90 tablet, Rfl: 3    aspirin (ECOTRIN) 81 MG tablet, Take 1 tablet (81 mg total) by mouth daily., Disp: 90 tablet, Rfl: 3    baclofen (LIORESAL) 10 MG tablet, Take 1 tablet (10 mg total) by mouth Three (3) times a day as needed for muscle spasms., Disp: 270 tablet, Rfl: 1    blood sugar diagnostic (GLUCOSE BLOOD) Strp, Test blood glucose 3 times daily., Disp: 50 strip, Rfl: 11    blood-glucose meter kit, Disp. blood glucose meter kit preferred by patient's insurance. Check blood sugars as directed by provider. Dx: Diabetes, E11.9, Disp: 1 each, Rfl: 11    blood-glucose meter,continuous (FREESTYLE LIBRE 3 READER) Misc, Use as directed to test blood sugar, Disp: 1 each, Rfl: 0    blood-glucose sensor (FREESTYLE LIBRE 3 PLUS SENSOR) Devi, Apply Freestyle Libre 3 sensor every 15 days., Disp: 6 each, Rfl: 3    calcium carbonate (TUMS) 200 mg calcium (500 mg) chewable tablet, Chew 2 tablets (1,000 mg total) daily as needed for heartburn., Disp: , Rfl:     cetirizine (ZYRTEC) 10 MG tablet, Take 1 tablet (10 mg total) by mouth daily., Disp: 90 tablet, Rfl: 3    chlorhexidine (PERIDEX) 0.12 % solution, Swish and spit out 15 mls two (2) times a day., Disp: 2365 mL, Rfl: 3    cholecalciferol, vitamin D3-1,250 mcg, 50,000 unit,, 1,250 mcg (50,000 unit) capsule, Take 1 capsule (1,250 mcg total) by mouth once a week., Disp: 24 capsule, Rfl: 0    colchicine (  COLCRYS) 0.6 mg tablet, Take 1 tablet (0.6 mg total) by mouth daily. (Patient taking differently: Take 1 tablet (0.6 mg total) by mouth as needed (gout flare).), Disp: 90 tablet, Rfl: 3    diaper,brief,adult,disposable Misc, 1 each by Miscellaneous route daily as needed (incontinence)., Disp: 80 each, Rfl: 10    diclofenac sodium (VOLTAREN) 1 % gel, Apply 2 g topically four (4) times a day., Disp: 200 g, Rfl: 6    dimethyl fumarate 240 mg CpDR, Take 1 capsule (240 mg total) by mouth two (2) times a day., Disp: 180 capsule, Rfl: 1    divalproex ER (DEPAKOTE ER) 500 MG extended released 24 hr tablet, Take 2 tablets (1,000 mg total) by mouth daily., Disp: 90 tablet, Rfl: 2    dulaglutide (TRULICITY) 3 mg/0.5 mL injection pen, Inject 0.5 mL (3 mg total) under the skin every seven (7) days., Disp: 6 mL, Rfl: 3    DULoxetine (CYMBALTA) 60 MG capsule, Take 1 capsule (60 mg total) by mouth daily., Disp: 90 capsule, Rfl: 3    empagliflozin (JARDIANCE) 25 mg tablet, Take 1 tablet (25 mg total) by mouth daily., Disp: 90 tablet, Rfl: 3    ezetimibe (ZETIA) 10 mg tablet, Take 1 tablet (10 mg total) by mouth daily., Disp: 90 tablet, Rfl: 3    fluticasone propionate (FLONASE) 50 mcg/actuation nasal spray, Use 1 spray into each nostril daily., Disp: 16 g, Rfl: 11    fluticasone-umeclidin-vilanter (TRELEGY ELLIPTA) 100-62.5-25 mcg inhaler, Inhale 1 puff daily., Disp: 180 each, Rfl: 3    ibuprofen (MOTRIN) 800 MG tablet, Take 1 tablet (800 mg total) by mouth every eight (8) hours as needed for pain (TAKE WITH FOOD/MILK)., Disp: 60 tablet, Rfl: 2    inhaler, assist devices (OPTICHAMBER, AEROCHAMBER, ADULT) Spcr, Use as directed with inhalers, Disp: 1 each, Rfl: 0    insulin glargine (LANTUS SOLOSTAR U-100 INSULIN) 100 unit/mL (3 mL) injection pen, Inject 0.3 mL (30 Units total) under the skin nightly., Disp: 15 mL, Rfl: 2    ipratropium-albuterol (DUO-NEB) 0.5-2.5 mg/3 mL nebulizer, Inhale 3 mL (contents of one nebule) by nebulization every six (6) hours as needed., Disp: 180 mL, Rfl: 3    lancets Misc, Disp. lancets #100 or amount allowed, Testing 1x/day. Dx: E11.9,Z79.4 (DM- controlled, insulin dep.). Please use preferred brand of True Metrix., Disp: 102 each, Rfl: 11    levothyroxine (SYNTHROID) 100 MCG tablet, Take 1 tablet (100 mcg total) by mouth daily., Disp: 90 tablet, Rfl: 2    lidocaine-prilocaine (EMLA) 2.5-2.5 % cream, Apply topically Three (3) times a day as needed., Disp: 30 g, Rfl: 0    magnesium oxide (MAG-OX) 400 mg (241.3 mg elemental magnesium) tablet, Take 1 tablet (400 mg total) by mouth daily., Disp: 30 tablet, Rfl: 11    miscellaneous medical supply Misc, Bed pads for incontinence, Disp: 100 each, Rfl: 3    nicotine (NICODERM CQ) 21 mg/24 hr patch, Place 1 patch on the skin daily., Disp: 28 patch, Rfl: 2    nicotine polacrilex (NICORETTE) 4 MG gum, Chew 1 piece of gum (4 mg total) by mouth every hour as directed as needed for smoking cessation., Disp: 110 each, Rfl: 3    ondansetron (ZOFRAN) 4 MG tablet, Take 1 tablet (4 mg total) by mouth daily as needed for nausea., Disp: 30 tablet, Rfl: 1    ondansetron (ZOFRAN-ODT) 4 MG disintegrating tablet, Dissolve 1 tablet (4 mg total) in the mouth every eight (8) hours as needed for nausea for  up to 7 days., Disp: 14 tablet, Rfl: 0    oxyCODONE (ROXICODONE) 5 MG immediate release tablet, Take 1 tablet (5 mg total) by mouth every eight (8) hours as needed for pain (severe pain unresponsive to tylenol/ibuprofen)., Disp: 90 tablet, Rfl: 0    pen needle, diabetic (PEN NEEDLE) 31 gauge x 5/16 (8 mm) Ndle, Injection Frequency is 1 time per day, Disp: 100 each, Rfl: 11    polyethylene glycol (GLYCOLAX) 17 gram/dose powder, Mix 17g (measure to line in cap) as directed and drink mixture daily., Disp: 510 g, Rfl: 0    polymyxin B sulf-trimethoprim (POLYTRIM) 10,000 unit- 1 mg/mL Drop, Administer 1 drop to both eyes every six (6) hours for 7 days., Disp: 2 mL, Rfl: 0    pregabalin (LYRICA) 75 MG capsule, Take 1 capsule (75 mg total) by mouth Three (3) times a day. Take every day. This is not an as needed medication., Disp: 270 capsule, Rfl: 1    QUEtiapine (SEROQUEL) 200 MG tablet, Take 1 tablet (200 mg total) by mouth nightly., Disp: 90 tablet, Rfl: 3    Allergies  Grass pollen-orchardgrass, standard and Statins-hmg-coa reductase inhibitors    Family History   Problem Relation Age of Onset    Asthma Mother     Cancer Son     Cancer Other     Breast cancer Neg Hx      Social History     Tobacco Use    Smoking status: Some Days     Current packs/day: 2.00     Average packs/day: 2.0 packs/day for 38.0 years (76.0 ttl pk-yrs)     Types: Cigarettes     Start date: 10/17/1985    Smokeless tobacco: Never   Vaping Use    Vaping status: Never Used   Substance Use Topics    Alcohol use: No    Drug use: Never      Physical Exam     ED Triage Vitals [10/21/23 1222]   Enc Vitals Group      BP 125/90      Heart Rate 98      SpO2 Pulse       Resp 20      Temp 36.8 ??C (98.2 ??F)      Temp Source Oral      SpO2 100 %      Weight 88.8 kg (195 lb 12.8 oz)     Constitutional: Alert and oriented. Well appearing and in no distress.  Eyes: Conjunctivae are normal.  ENT       Head: Normocephalic and atraumatic.       Nose: No congestion.       Mouth/Throat: Mucous membranes are moist.       Neck: No stridor.  Hematological/Lymphatic/Immunilogical: No cervical lymphadenopathy.  Cardiovascular: Normal rate, regular rhythm. Normal and symmetric distal pulses are present in all extremities.  Respiratory: Normal respiratory effort. Breath sounds are normal. Intermittent non-productive cough. Speaking in full, clear sentences.   Gastrointestinal: Soft and nontender. There is no CVA tenderness.  Musculoskeletal: Normal range of motion in all extremities.       Right lower leg: No tenderness or edema.       Left lower leg: No tenderness or edema.  Neurologic: Normal speech and language. No gross focal neurologic deficits are appreciated.  Skin: Skin is warm, dry and intact. No rash noted.  Psychiatric: Mood and affect are normal. Speech and behavior are normal.     Radiology  XR Chest 2 views   Final Result      No acute cardiopulmonary abnormalities.         Procedures including Critical Care     None    Documentation assistance was provided by Aris Lot, Scribe on October 21, 2023 at 5:15 PM for Louisa Second, [place].     October 21, 2023 7:24 PM. Documentation assistance provided by the scribe. I was present during the time the encounter was recorded. The information recorded by the scribe was done at my direction and has been reviewed and validated by me.        Ann Lions, [place]  10/21/23 1924

## 2023-10-21 NOTE — Unmapped (Signed)
[facility]  Care Management Progress Note               Purpose of contact: PCS     Patient called SW - she said she is at [facility] ED,     Patient requested SW call Ms. Brynda Greathouse with [employer] about PCS (phone: [phone]). SW spoke to Ms. Brynda Greathouse who said she cannot work with patient until she hears from [employer].    SW called [employer]. They do not have anyone that can let SW know today the status of the PCS form. They will send a message to that department, the department will send one back, and then SW will get a call. They are not sure how many business days until SW will call back.       Shea Stakes, CCM  [facility] Manager  [facility]  [phone]

## 2023-10-22 MED ORDER — VENTOLIN HFA 90 MCG/ACTUATION AEROSOL INHALER
0 refills | 0.00 days
Start: 2023-10-22 — End: ?

## 2023-10-22 NOTE — Unmapped (Signed)
[facility]  Care Management Progress Note               Purpose of contact: PCS    [facility] called an said the [facility] referral was received on 1/6 and an assessment was scheduled for 1/31, but it is unknown if the assessment was completed. Once the RN updates the department, they will update SW.     Shea Stakes, CCM  [facility] Manager  [facility]  [phone]

## 2023-10-23 MED ORDER — LANTUS SOLOSTAR U-100 INSULIN 100 UNIT/ML (3 ML) SUBCUTANEOUS PEN
Freq: Every evening | SUBCUTANEOUS | 2 refills | 75.00 days
Start: 2023-10-23 — End: ?

## 2023-10-23 NOTE — Unmapped (Signed)
Patient insulin regimen has been changed.  This is outdated prescription.

## 2023-10-23 NOTE — Unmapped (Signed)
[facility]  Care Management Progress Note               Purpose of contact: CAP in home support     SW put copy of CAP form in provider's box and messaged him request to complete.    Shea Stakes, CCM  [facility] Manager  [facility]  [phone]

## 2023-10-27 NOTE — Unmapped (Signed)
[facility] Family Medicine  Care Management Progress Note               Purpose of contact: paperwork    Patient said she is still sick and needs paperwork to show that for a court date tomorrow; SW talked with her about options, she is going to use her [facility] AVS from 2/9.     Shea Stakes, CCM  [facility] Manager  [facility]  [phone]

## 2023-10-27 NOTE — Unmapped (Signed)
[facility]  Care Management Progress Note               Purpose of contact: referral updates     SW and patient discussed status of current referrals. She plans to get PCS from [facility], and she said the [facility] assessment was competed on 1/31.     SW gave patient the contact for [facility] pain clinic (referral was previously faxed)    Status of current referrals:  [x]  [facility] care manager: Patient has a Futures trader through [employer], email address [email or web address], phone number [phone]. SW last spoke with her on 12/30  [x]  CAP/DA: (additional in-home assistance through [facility]): Patient part of application faxed by SW on 1/21. PCP part of application placed in PCP's box on 1/21 and 2/6.  [x]  Housing referrals: Patient lives with family members in [place]. SW gave information for Senior apartments in [place] on 12/18 and encouraged her to put in an application to get on their wait list.   [x]  Home heath: PT and OT through [facility] and Hospice.   [x]  Medication administration concerns: Follow up with CPP [name] scheduled for 2/20.  [x]  Neurology: Referral was made to [facility] Neurology at patient request; last appointment was on 12/10. Patient is scheduled with [facility] on 5/7  [x]  PCS referral: (Personal Care Services are in-home aide assistance with ADLs, through [facility]): Assessment with patient completed on 1/31.  [x]  Pain Management: Referral faxed to [facility] on 10/14/23 (phone: [phone], fax: [phone]). Patient given contact information on 2/10.  [x]  Psychiatry: Patient was given information on crisis and walk in clinics. Appointment with [facility] Psychiatry on 1/14.      Shea Stakes, CCM  [facility] Manager  [facility]  [phone]

## 2023-10-28 NOTE — Unmapped (Signed)
[facility]  Care Management Progress Note               Purpose of contact: Pain Management referral     SW called [facility] (phone: [phone], fax: [phone]). They need at least the last 5 years of records in order to see patient, but would like as many years as is available. SW verified fax number.     Patient will be in clinic for a CPP appointment on 2/20. SW messaged CPP Tobi Bastos and onsite Care Managers request to have patient sign a form requesting her medical records be sent to [facility].    Shea Stakes, CCM  [facility] Manager  [facility]  [phone]

## 2023-10-29 NOTE — Unmapped (Signed)
[facility] Family Medicine  Care Management Progress Note               Purpose of contact: CAP paperwork     SW faxed completed CAP paperwork to [employer] (fax# [phone]). SW called patient and updated her.     Status of current referrals:  [x]  [facility] care manager: Patient has a Futures trader through [employer] [facility] [employer], email address [email or web address], phone number [phone].   [x]  CAP/DA: (additional in-home assistance through [facility]): Patient part of application faxed by SW on 1/21. Provider portion faxed 10/29/23.  [x]  Housing referrals: Patient lives with family members in [place]. SW gave information for Senior apartments in [place] on 12/18 and encouraged her to put in an application to get on their wait list.   [x]  Home heath: PT and OT through [facility] and Hospice.   [x]  Medication administration concerns: Follow up with CPP [name] scheduled for 2/20.  [x]  Neurology: Referral was made to [facility] Neurology at patient request; last appointment was on 12/10. Patient is scheduled with [facility] on 5/7  [x]  PCS referral: (Personal Care Services are in-home aide assistance with ADLs, through [facility]): Assessment with patient completed on 1/31.  [x]  Pain Management: Referral faxed to [facility] on 10/14/23 (phone: [phone], fax: [phone]). Patient given contact information on 2/10. Patient will complete records request to send her medical record to them on 2/20.   [x]  Psychiatry: Patient was given information on crisis and walk in clinics. Appointment with [facility] Psychiatry on 1/14.       Shea Stakes, CCM  [facility] Manager  [facility]  [phone]

## 2023-11-03 LAB — COLOGUARD: COLOGUARD: NEGATIVE

## 2023-11-03 NOTE — Unmapped (Signed)
I reviewed with the resident the medical history and the residents findings on physical examination.  I discussed with the resident the patients diagnosis and concur with the treatment plan as documented in the resident note.     Rolm Bookbinder, M.D.

## 2023-11-03 NOTE — Unmapped (Signed)
 Patient has expressed being out of her Lantus.  Will refill.  Will also get A1c.  Orders:    insulin glargine (LANTUS SOLOSTAR U-100 INSULIN) 100 unit/mL (3 mL) injection pen; Inject 0.3 mL (30 Units total) under the skin nightly.    blood-glucose meter,continuous (FREESTYLE LIBRE 3 READER) Misc; Use as directed to test blood sugar    Basic Metabolic Panel; Future    Hemoglobin A1c; Future
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
## 2023-11-05 NOTE — Unmapped (Incomplete)
 Medications for quitting smoking:  Nicotine patch and gum    COPD medications:  Trelegy Ellipta 1 puff daily - use QUICK and deep breath, rinse mouth after each dose!  Albuterol 1-2 puffs every 4-6 hours as needed for shortness of breath or wheezing - SLOW and deep breath, separate puffs by around 1 minute    Blood pressure medications:  Amlodipine 1 tablet (10 mg) daily    Diabetes medications:  Trulicity 3 mg injection once weekly on Sundays  Lantus 30 units once daily  Jardiance 1 tablet (25 mg) daily    How often and when to check blood sugar:  Use the Freestyle Libre 3 CGM Reader as instructed and change sensors every 14 days.    How to treat low blood sugar:  For blood sugar less than 70 --> Treat with 4 ounces of juice or regular soda, or with 3 to 4 glucose tablets. Re-check blood sugar in 15 minutes.    If blood sugar is still less than 70 on re-check, treat again and re-check in 15 minutes.   If blood sugar is over 70 on re-check and it is time to eat your regular meal, eat regular meal and take insulin as prescribed for that blood sugar.    When to follow-up:  *** with Tobi Bastos (pharmacist)      [name], PharmD, CPP, [facility]  Family Medicine Clinical Pharmacist  Clinic Phone: [phone]

## 2023-11-05 NOTE — Unmapped (Unsigned)
 Subjective     Reason for visit:    Tonya Wood is a 58 y.o. female with a history of diabetes (type 2), COPD, HTN, schizoaffective disorder, and tobacco use who presents today for a diabetes pharmacotherapy visit.  Patient presents to this visit alone.    Known DM Complications: no known complications    Date of Last Diabetes Related Visit: 10/02/23 with CPP, 10/17/23 with PCP ([facility])    Action At Last Diabetes Related Visit:    Increase Trulicity from 1.5 to 3 mg SubQ weekly  Continue Lantus 30 units daily  Continue Jardiance 25 mg daily  Stop Anoro Ellipta 1 puff daily  Start Trelegy Ellipta 100-62.5-25 mcg 1 puff once daily  Continue nicotine 21 mg/24h patch + 4 mg gum PRN  Continue ezetimibe 10 mg daily  Continue amlodipine 10 mg daily    Since Last visit / History of Present Illness:    Patient reports fully implementing plan from last visit. ***    Questions/notes for today:  - Review CGM data?  - S/sx high or low BG?  - Adherence/tolerability with Trulicity 3 mg?  - Titrate Trulicity to 4.5 mg??  - Taper insulin as indicated?    - mMRC?  - Confirm change to Trelegy?  - Inhaler technique? Rinsing mouth out after use to prevent thrush?    - Smoking cessation?  - Use of NRT?    - Clinic BP?  - S/sx orthostasis?    Reported DM Regimen:  Lantus 30 units nightly  Trulicity 1.5 mg weekly (Sundays)  Jardiance 25 mg daily    DM medications tried in the past:   Metformin - pt doesn't like it so is not willing to take  Novolog - decreased insulin needs    Medication Adherence and Access:  Since last visit, patient denies missing doses of medications recently (including insulin). At last visit, reported using new system of marking down in a paper log book when she takes all meds so she know she doesn't miss or double up on them. Note she has had intermittent non-adherence in the recent past with periods of severe pain.    Using weekly pillbox, which currently is being filled and managed by brother.    Pt had [facility] P[facility] in the past but now has active Medicaid.    SMBG  N/A :  Using Libre 3 CGM as instructed. Did not bring CGM reader today so unable to provide data from CGM reports. As noted above, had a couple weeks of lows but more recently has had more highs. Reports highest glucose reading in the past week was >300.    Pt also notes she did not get the correct brand of lancets last time she was prescribed this so requests new Rx for True Metrix brand so she can perform fingerstick BG checks if needed in the future.    Hypoglycemia:    Symptoms of hypoglycemia since last visit: yes  If yes, it was treated by:  unknown    DM-Related Prevention:  Statin: Not taking (stopped statin due to concern for causing elevated CK);  previously on atorvastatin 40 mg (high intensity) , now on ezetimibe 10 mg daily  Aspirin: unclear if indicated (pt reports history of CVA that occurred prior to seeking care at [facility] so not in recent health records); Taking    ACEI/ARB: Not taking (previously on lisinopril 20 mg but c/f dry cough and ACEi stopped 06/2023); Urine MA/CR Ratio - normal (last checked  01/01/23).  Last eye exam: 04/24/20 - DUE  Last foot exam: 01/01/23  Tobacco Use: Current smoker  Immunizations:   Immunization History   Administered Date(s) Administered    COVID-19 VAC,BIVALENT(38YR UP),PFIZER 08/08/2021    COVID-19 VAC,MRNA,TRIS(12Y UP)(PFIZER)(GRAY CAP) 01/28/2020, 02/18/2020    COVID-19 VACC,MRNA,(PFIZER)(PF) 01/28/2020, 02/18/2020, 08/21/2020    Covid-19 Vac, (38yr+) (Comirnaty) Mrna Pfizer  07/02/2023    Covid-19 Vac, (82yr+) (Spikevax) Monovalent Moderna 08/01/2022    Covid-19 Vacc, Unspecified 01/28/2020, 02/18/2020, 08/21/2020    Hep A / Hep B 09/27/2003, 10/28/2003, 10/17/2006    INFLUENZA TIV (TRI) 88MO+ W/ PRESERV (IM) 02/25/2008, 07/11/2010    INFLUENZA TIV (TRI) PF (IM)(HISTORICAL) 07/01/2013    INFLUENZA VACCINE IIV3(IM)(PF)6 MOS UP 07/02/2023    Influenza Vaccine Quad(IM)6 MO-Adult(PF) 06/09/2015, 01/08/2017, 07/22/2018, 06/30/2020, 07/17/2021, 05/21/2022    Influenza Virus Vaccine, unspecified formulation 06/30/2020    PPD Test 07/06/2014, 07/15/2014, 05/22/2016    Pneumococcal Conjugate 20-valent 12/28/2021    SHINGRIX-ZOSTER VACCINE (HZV),RECOMBINANT,ADJUVANTED(IM) 07/02/2023, 10/14/2023    TdaP 12/19/2011, 05/05/2019     _________________________________________________    Past Medical History: reviewed PMH in epic today    Social History:  Social History     Tobacco Use   Smoking Status Some Days    Current packs/day: 2.00    Average packs/day: 2.0 packs/day for 38.0 years (76.1 ttl pk-yrs)    Types: Cigarettes    Start date: 10/17/1985   Smokeless Tobacco Never       Medications: Medications reviewed in EPIC medication station and updated today by the clinical pharmacist practitioner.     Objective   Review of Systems:  Constitutional:  No fever, chills or unintentional weight loss  Cardiovascular:  No chest pain or pressure, shortness of breath, orthopnea or LE edema  Pulmonary:  No cough or SOB  GI:  No constipation, diarrhea, dyspepsia, change in bowel habits, nausea, abdominal pain  Endocrine: No polyuria, polyphagia, blurred vision    Physical Examination:  Vitals:    There were no vitals filed for this visit.  ***    Wt Readings from Last 3 Encounters:   10/21/23 88.8 kg (195 lb 12.8 oz)   10/17/23 89.4 kg (197 lb)   10/14/23 89.6 kg (197 lb 9.6 oz)       There is no height or weight on file to calculate BMI.    The 10-year ASCVD risk score (Arnett DK, et al., 2019) is: 17.5%    Values used to calculate the score:      Age: 33 years      Sex: Female      Is Non-Hispanic African American: Yes      Diabetic: Yes      Tobacco smoker: Yes      Systolic Blood Pressure: 115 mmHg      Is BP treated: Yes      HDL Cholesterol: 41 mg/dL      Total Cholesterol: 152 mg/dL    Note: For patients with SBP <90 or >200, Total Cholesterol <130 or >320, HDL <20 or >100 which are outside of the allowable range, the calculator will use these upper or lower values to calculate the patient???s risk score.    mMRC:  Most recent MMRC dyspnea scale: 3 Last MMRC date: 05/13/2023    Labs:   Lab Results   Component Value Date    A1C 6.9 (H) 10/17/2023    A1C 6.2 (H) 07/02/2023     Lab Results   Component Value Date  CHOL 152 09/25/2022    TRIG 205 (H) 09/25/2022    HDL 41 09/25/2022    LDL 70 09/25/2022           Assessment/Plan:    1. Diabetes, type 2: controlled per last A1c of 6.9% (10/17/23). Goal <7% per ADA guidelines. *** Unable to assess CGM data without CGM reader today and per pt report seems glucose readings have been somewhat variable with a couple weeks of lows last month and now more frequent hyperglycemia. Since Trulicity has minimal to no risk of hypoglycemia and pt has tolerated higher doses in the past, agreed to increase today even though we don't have confirmed glucose readings from recent weeks. Suspect we may be able to taper insulin in the future again so hope to determine this at next visit as long as pt has CGM reader at that time.  Increase Trulicity from 1.5 to 3 mg SubQ once weekly  Continue Lantus 30 units daily  Continue Jardiance 25 mg daily  Repeat A1c due between Jan and April 2025 - plan to check at next visit (deferred today per pt preference)  Reviewed symptoms and treatment of hypoglycemia, including need to check BG prior to and following treatment.  SMBG instructions:  use Libre 3 CGM as instructed (change sensors every 14 days)  Sent new Rx for lancets with instructions to dispense True Metrix brand per pt preference  Future considerations:  Taper insulin as indicated  Room to titrate Trulicity back up if tolerated (on lower dose after intermittent issues with drug shortages earlier in 2024)    2. Mild COPD (FEV1 >80% on PFTs 04/2022): pt reports only slight improvement in breathing since switching inhaler devices from Stiolto Respimat to Anoro Ellipta at last CPP visit. Still feel pt may benefit from escalation to triple therapy given exacerbation history in the past year and she agrees given better controlled on Advair in the past per her report and suspect recurrent thrush primarily because pt was not rinsing mouth out after use of inhaler. Emphasized importance of rinsing mouth out after ICS-containing inhaler and agreed to escalate inhaler regimen further to Trelegy Ellipta (given pt comfortable w/ this device already).  Continue Trelegy Ellipta 100-62.5-25 mcg 1 puff once daily  Reviewed importance of rinsing mouth out after every dose to prevent thrush  Continue albuterol HFA inhaler (w/ spacer) q4-6h PRN    3. Smoking cessation: pt reports working to reduce # of cigarettes per day but has not yet quit, recently has increased smoking due to stressors at home. Intermittently using NRT patch + gum which do help. Will continue to hold off on stepping down nicotine patch dose since pt still smoking occasionally, but will reconsider once she is ready to quit smoking cigarettes altogether.  Continue nicotine 21 mg/24h patch daily + 4 mg gum PRN  Consider stepping down patch to 14 mg/24h dose as able in the future  Sent updated Rx for nicotine 4 mg gum per pt request    4. ASCVD Prevention: moderate to high risk based on 10-year ASCVD risk score. No longer on high intensity statin due to some concern it was contributing to elevated CK levels, but on ezetimibe since June 2023. Last lipid panel with LDL 70 (09/25/22) and improved from 123 in Oct 2023. Goal LDL <70 per 2022 ACC ECDP given DM + ASCVD risk >7.5%. Since LDL essentially at goal, will not pursue any additional non-statin LDL lowering therapies at this time.  Continue ezetimibe 10 mg  daily    5. Hypertension: controlled based on last clinic BP of 116/64 (09/23/23). Goal <130/80 per ADA guidelines. No further med changes warranted at this time.  Continue amlodipine 10 mg daily  Future considerations:  Resume ARB (possible cough with ACEi) if additional BP lowering needed and choose agent that comes in combo w/ amlodipine if able  May also consider addition of thiazide diuretic if additional BP lowering needed on max doses of other first line agents    6. Medication management and care coordination:  Sent message to [facility] Meghan about pt request for assistance completing NCLIFTS form. SW Ronaldo Miyamoto was able to assist pt today before pt left and held onto paperwork to give to pt's primary CM Meghan in the coming days.        Follow-up: end of January with PCP as scheduled and ~1 month with CPP    Future Appointments   Date Time Provider Department Center   11/06/2023  9:00 AM Melba Coon, CPP [facility]   11/24/2023  9:00 AM Alvino Chapel, Hee Jae, DO [facility]   12/01/2023  9:00 AM [facility] [place] [facility] 1 [facility] [place] [facility]   01/21/2024 11:20 AM Kelle Darting, MD [facility]       I spent a total of 30 minutes face to face with the patient delivering clinical care and providing education/counseling.    _________________________________________________    [name], PharmD, CPP, [facility]  Family Medicine Clinical Pharmacist

## 2023-11-08 NOTE — Unmapped (Signed)
 Regarding: lc/dd medication blood- sugar  ----- Message from Tonny Branch, RN sent at 11/08/2023 11:00 AM EST -----  Copied From CRM [phone].[facility] Triage

## 2023-11-08 NOTE — Unmapped (Signed)
 Upcoming Appt:  Future Appointments   Date Time Provider Department Center   11/24/2023  9:00 AM Myriam Jacobson [place], [place] [facility]   12/01/2023  9:00 AM [facility] [place] [facility] [place] [facility]   01/21/2024 11:20 AM Kelle Darting, MD [facility]       Disposition:  Home Care    Is this a pediatric patient?   No   Any recent, relevant visit?   No    Any relevant medical history?   Yes   What history?  Dm, ms     Any interventions?   Yes   What has been done? (include related medication given, dose, and date/time)  Lantus insulin daily, Jardiance daily , Trulicity weekly on Thursdays(out of this medication), blood monitor censor(needs a refill)    Reason for Disposition   [1] Blood glucose 240 - 300 mg/dL (47.8 - 29.5 mmol/L) AND [2] uses insulin (e.g., insulin-dependent, all people with type 1 diabetes)    Answer Assessment - Initial Assessment Questions  1. BLOOD GLUCOSE: What is your blood glucose level?       Unsure, last night on 2/21 bs- 299  2. ONSET: When did you check the blood glucose?      I have a glucose Censor but need a refill because it isnt working anymore and I also need trulicity injections refilled and my pharmacy isnt open today    3. USUAL RANGE: What is your glucose level usually? (e.g., usual fasting morning value, usual evening value)      110-120    4. KETONES: Do you check for ketones (urine or blood test strips)? If Yes, ask: What does the test show now?       No    5. TYPE 1 or 2:  Do you know what type of diabetes you have?  (e.g., Type 1, Type 2, Gestational; doesn't know)       Type 2     6. INSULIN: Do you take insulin? What type of insulin(s) do you use? What is the mode of delivery? (syringe, pen; injection or pump)?       Yes , lantus daily and Trulicity weekly    7. DIABETES PILLS: Do you take any pills for your diabetes? If Yes, ask: Have you missed taking any pills recently?      Yes, no    8. OTHER SYMPTOMS: Do you have any symptoms? (e.g., fever, frequent urination, difficulty breathing, dizziness, weakness, vomiting)      MS flare up- mild headache, mild lightheadedness    9. PREGNANCY: Is there any chance you are pregnant? When was your last menstrual period?      na    Protocols used: Diabetes - High Blood Sugar-A-AH

## 2023-11-11 ENCOUNTER — Ambulatory Visit: Admit: 2023-11-11 | Discharge: 2023-11-12 | Payer: PRIVATE HEALTH INSURANCE

## 2023-11-11 DIAGNOSIS — M545 Chronic bilateral low back pain without sciatica: Principal | ICD-10-CM

## 2023-11-11 DIAGNOSIS — G8929 Other chronic pain: Principal | ICD-10-CM

## 2023-11-11 LAB — TOXICOLOGY SCREEN, URINE
AMPHETAMINE SCREEN URINE: NEGATIVE
BARBITURATE SCREEN URINE: NEGATIVE
BENZODIAZEPINE SCREEN, URINE: NEGATIVE
BUPRENORPHINE, URINE SCREEN: NEGATIVE
CANNABINOID SCREEN URINE: NEGATIVE
COCAINE(METAB.)SCREEN, URINE: NEGATIVE
FENTANYL SCREEN, URINE: NEGATIVE
METHADONE SCREEN, URINE: NEGATIVE
OPIATE SCREEN URINE: NEGATIVE
OXYCODONE SCREEN URINE: NEGATIVE

## 2023-11-11 MED ORDER — CHLORHEXIDINE GLUCONATE 0.12 % MOUTHWASH
Freq: Two times a day (BID) | OROMUCOSAL | 3 refills | 79.00 days | Status: CP
Start: 2023-11-11 — End: 2024-09-21

## 2023-11-11 MED ORDER — FLUTICASONE PROPIONATE 50 MCG/ACTUATION NASAL SPRAY,SUSPENSION
Freq: Every day | NASAL | 11 refills | 120.00 days | Status: CP
Start: 2023-11-11 — End: 2024-11-10

## 2023-11-11 MED ORDER — NICOTINE (POLACRILEX) 4 MG GUM
ORAL | 3 refills | 5.00 days | Status: CP | PRN
Start: 2023-11-11 — End: ?

## 2023-11-11 MED ORDER — OXYCODONE 5 MG TABLET
ORAL_TABLET | Freq: Three times a day (TID) | ORAL | 0 refills | 30.00 days | Status: CP | PRN
Start: 2023-11-11 — End: 2023-12-11

## 2023-11-11 MED ORDER — EMPAGLIFLOZIN 25 MG TABLET
ORAL_TABLET | Freq: Every day | ORAL | 3 refills | 90.00 days | Status: CP
Start: 2023-11-11 — End: 2024-11-10

## 2023-11-11 MED ORDER — NICOTINE 21 MG/24 HR DAILY TRANSDERMAL PATCH
MEDICATED_PATCH | TRANSDERMAL | 2 refills | 28.00 days | Status: CP
Start: 2023-11-11 — End: ?

## 2023-11-11 MED ORDER — MAGNESIUM OXIDE 400 MG (241.3 MG MAGNESIUM) TABLET
ORAL_TABLET | Freq: Every day | ORAL | 11 refills | 30.00 days | Status: CP
Start: 2023-11-11 — End: 2024-11-10

## 2023-11-11 MED ORDER — ASPIRIN 81 MG TABLET,DELAYED RELEASE
ORAL_TABLET | Freq: Every day | ORAL | 3 refills | 90.00 days | Status: CP
Start: 2023-11-11 — End: 2024-11-10

## 2023-11-11 MED ORDER — DULOXETINE 60 MG CAPSULE,DELAYED RELEASE
ORAL_CAPSULE | Freq: Every day | ORAL | 3 refills | 90.00 days | Status: CP
Start: 2023-11-11 — End: 2024-11-05

## 2023-11-11 NOTE — Unmapped (Signed)
 Condition is stable  Medication and treatment plan as described in orders or med list.  Counseled on etiology, treatment, warning signs.  Provided information regarding diagnoses and treatment plans    Orders:    oxyCODONE (ROXICODONE) 5 MG immediate release tablet; Take 1 tablet (5 mg total) by mouth every eight (8) hours as needed for pain (severe pain unresponsive to tylenol/ibuprofen).    Toxicology Screen, Urine

## 2023-11-11 NOTE — Unmapped (Signed)
 [facility]  Care Management Progress Note               Purpose of contact: medical records for pain clinic referral    Patient signed medical records request for [facility] records to be sent to [facility]. SW emailed form to [facility] records department.     Status of current referrals:  [x]  [facility] care manager: Patient has a Futures trader through [employer] [facility] [employer] [place], email address [email or web address], phone number [phone].   [x]  CAP/DA: (additional in-home assistance through [facility]): Patient part of application faxed by SW on 1/21. Provider portion faxed 10/29/23.  [x]  Housing referrals: Patient lives with family members in [place]. SW gave information for Senior apartments in [place] on 12/18 and encouraged her to put in an application to get on their wait list.   [x]  Home heath: PT and OT through [facility].   [x]  Medication administration concerns: Follow up with CPP [name] scheduled for 2/20.  [x]  Neurology: Referral was made to [facility] Neurology at patient request; last appointment was on 12/10. Patient is scheduled with [facility] Neurology on 5/7  [x]  PCS referral: (Personal Care Services are in-home aide assistance with ADLs, through [facility]): Assessment with patient completed on 1/31.  [x]  Pain Management: Referral faxed to [facility] on 10/14/23 (phone: [phone], fax: [phone]). Patient given contact information on 2/10. Authorization form for records to be sent to [facility] sent on 11/11/23.   [x]  Psychiatry: Patient was given information on crisis and walk in clinics. Appointment with [facility] on 1/14.    Shea Stakes, CCM  [facility] Manager  [facility]  [phone]

## 2023-11-11 NOTE — Unmapped (Signed)
 Orders:    fluticasone propionate (FLONASE) 50 mcg/actuation nasal spray; Use 1 spray into each nostril daily.

## 2023-11-11 NOTE — Unmapped (Addendum)
 Condition is resolved  Medication and treatment plan as described in orders or med list.  Counseled on etiology, treatment, warning signs.  Provided information regarding diagnoses and treatment plans

## 2023-11-11 NOTE — Unmapped (Signed)
 ASSESSMENT/PLAN    Assessment & Plan  Mouth pain    Orders:    chlorhexidine (PERIDEX) 0.12 % solution; Swish and spit out 15 mls two (2) times a day.    Chronic bilateral low back pain without sciatica    Orders:    DULoxetine (CYMBALTA) 60 MG capsule; Take 1 capsule (60 mg total) by mouth daily.    Severe tobacco use disorder  Condition is stable  Medication and treatment plan as described in orders or med list.  Counseled on etiology, treatment, warning signs.  Provided information regarding diagnoses and treatment plans      Orders:    nicotine (NICODERM CQ) 21 mg/24 hr patch; Place 1 patch on the skin daily.    nicotine polacrilex (NICORETTE) 4 MG gum; Chew 1 piece of gum (4 mg total) by mouth every hour as directed as needed for smoking cessation.    Chronic pain syndrome  Condition is stable  Medication and treatment plan as described in orders or med list.  Counseled on etiology, treatment, warning signs.  Provided information regarding diagnoses and treatment plans    Orders:    oxyCODONE (ROXICODONE) 5 MG immediate release tablet; Take 1 tablet (5 mg total) by mouth every eight (8) hours as needed for pain (severe pain unresponsive to tylenol/ibuprofen).    Toxicology Screen, Urine    Allergic rhinitis, unspecified seasonality, unspecified trigger    Orders:    fluticasone propionate (FLONASE) 50 mcg/actuation nasal spray; Use 1 spray into each nostril daily.    Type 2 diabetes mellitus without complication, without long-term current use of insulin (CMS-HCC)  Condition is stable  Medication and treatment plan as described in orders or med list.  Counseled on etiology, treatment, warning signs.  Provided information regarding diagnoses and treatment plans          Orders:    aspirin (ECOTRIN) 81 MG tablet; Take 1 tablet (81 mg total) by mouth daily.    empagliflozin (JARDIANCE) 25 mg tablet; Take 1 tablet (25 mg total) by mouth daily.    magnesium oxide (MAG-OX) 400 mg (241.3 mg elemental magnesium) tablet; Take 1 tablet (400 mg total) by mouth daily.    Viral upper respiratory tract infection  Condition is resolved  Medication and treatment plan as described in orders or med list.  Counseled on etiology, treatment, warning signs.  Provided information regarding diagnoses and treatment plans             I personally spent 30 minutes face-to-face and non-face-to-face in the care of this patient, which includes all pre, intra, and post visit time on the date of service.    SUBJECTIVE    Subjective   This is a 58 y.o. female who presents with had concerns including URI, Diabetes, and Pain.     Upper Respiratory Infection  Patient complains of symptoms of a URI, follow up on a URI, she was seen in ED 3 weeks ago. Per note; she describes days of nausea and emesis and has known sick contacts in children and family members.  She has upper respiratory symptoms and cough that began wet but is now dry.  Chest discomfort is only with coughing.  She has new bilateral eye discomfort with crusting and discharge.     Vital signs grossly unremarkable.  Viral 4 Plex is negative.  RPP is sent and patient notified results via MyChart.  Chest x-ray without overt signs of pneumonia.  ECG without acute findings.  Laboratory results without  overt electrolyte abnormalities or significant blood count disparities.  Patient provided Polytrim eyedrops and instructed to use OTC methods for cough.. Symptoms have resolved.    Diabetes Mellitus  Patient presents for follow up of diabetes. Current symptoms include: none.     Chronic Pain  Patient with chronic pain that is managed by opiates. She has pain contract with [facility] provider who is unavailable. Denies any change in pain pattern          REVIEW OF SYSTEMS    Past medical history, medications, social history, and allergies reviewed and updated.  Remainder of systems review negative except father recently died; needs med refills         Objective     OBJECTIVE  BP 110/85 (BP Site: R Arm, BP Position: Sitting, BP Cuff Size: Large)  - Pulse 92  - Temp 36.2 ??C (97.1 ??F) (Temporal)  - Ht 170.2 cm (5' 7)  - Wt 87.2 kg (192 lb 3.2 oz)  - BMI 30.10 kg/m??    Physical Examination  Vital Signs- reviewed  General appearance - alert, well appearing, and in no distress  Mental status - alert, oriented to person, place, and time  Neck - supple, no significant adenopathy  Chest - clear to auscultation, no wheezes, rales or rhonchi, symmetric air entry  Heart - normal rate, regular rhythm without murmurs, rubs, clicks or gallops  Abdomen - soft, nontender, nondistended, no masses or organomegaly  Neurological - alert, oriented, normal speech, no focal findings or movement disorder noted  Extremities - trace pedal edema, no clubbing or cyanosis  Skin - normal coloration and turgor, no rashes, no suspicious skin lesions noted  Musculoskeletal- ambulates with walker    Labs, Imaging, and Other Clinical Data:  I have reviewed the labs, imaging studies, and other clinical data associated with this encounter.  See Epic Labs and Imaging section for details.

## 2023-11-11 NOTE — Unmapped (Addendum)
 Condition is stable  Medication and treatment plan as described in orders or med list.  Counseled on etiology, treatment, warning signs.  Provided information regarding diagnoses and treatment plans      Orders:    nicotine (NICODERM CQ) 21 mg/24 hr patch; Place 1 patch on the skin daily.    nicotine polacrilex (NICORETTE) 4 MG gum; Chew 1 piece of gum (4 mg total) by mouth every hour as directed as needed for smoking cessation.

## 2023-11-11 NOTE — Unmapped (Signed)
 Condition is stable  Medication and treatment plan as described in orders or med list.  Counseled on etiology, treatment, warning signs.  Provided information regarding diagnoses and treatment plans          Orders:    aspirin (ECOTRIN) 81 MG tablet; Take 1 tablet (81 mg total) by mouth daily.    empagliflozin (JARDIANCE) 25 mg tablet; Take 1 tablet (25 mg total) by mouth daily.    magnesium oxide (MAG-OX) 400 mg (241.3 mg elemental magnesium) tablet; Take 1 tablet (400 mg total) by mouth daily.

## 2023-11-14 LAB — OPIATE, URINE, QUANTITATIVE
6-MONOACETYLMRPH: <5 ng/mL (ref <5)
BUPRENORPHINE: <5 ng/mL (ref <5)
CODEINE GC/MS CONF: <25 ng/mL (ref <25)
FENTANYL, URINE GC/MS: <0.5 ng/mL (ref <0.5)
HYDROCODONE GC/MS CONF: <25 ng/mL (ref <25)
HYDROMORPHONE GC/MS CONF: <25 ng/mL (ref <25)
MORPHINE GC/MS CONF: <25 ng/mL (ref <25)
NORBUPRENORPHINE: <5 ng/mL (ref <5)
NORFENTANYL, UR GC/MS: <1 ng/mL (ref <1.0)
OPIATE INTERP: NEGATIVE
OXYCODONE (GC/MS): <25 ng/mL (ref <25)
OXYMORPHONE: <25 ng/mL (ref <25)

## 2023-11-17 DIAGNOSIS — F1190 Opioid use, unspecified, uncomplicated: Principal | ICD-10-CM

## 2023-11-17 MED ORDER — POLYETHYLENE GLYCOL 3350 17 GRAM/DOSE ORAL POWDER
0 refills | 0.00 days | Status: CP
Start: 2023-11-17 — End: ?

## 2023-11-19 NOTE — Unmapped (Unsigned)
 Subjective     Reason for visit:    Tonya Wood is a 58 y.o. female with a history of diabetes (type 2), COPD, HTN, schizoaffective disorder, and tobacco use who presents today for a diabetes pharmacotherapy visit.  Patient presents to this visit alone.    Known DM Complications: no known complications    Date of Last Diabetes Related Visit: 10/02/23 with CPP, 10/17/23 with PCP ([facility])    Action At Last Diabetes Related Visit:    Increase Trulicity from 1.5 to 3 mg SubQ weekly  Continue Lantus 30 units daily  Continue Jardiance 25 mg daily  Stop Anoro Ellipta 1 puff daily  Start Trelegy Ellipta 100-62.5-25 mcg 1 puff once daily  Continue nicotine 21 mg/24h patch + 4 mg gum PRN  Continue ezetimibe 10 mg daily  Continue amlodipine 10 mg daily    Since Last visit / History of Present Illness:    Patient reports fully implementing plan from last visit. ***    Questions/notes for today:  - Review CGM data?  - S/sx high or low BG?  - Adherence/tolerability with Trulicity 3 mg?  - Titrate Trulicity to 4.5 mg??  - Taper insulin as indicated?  - Future: change Trulicity to [facility] to help w/ further insulin tapering?    - mMRC?  - Confirm change to Trelegy?  - Inhaler technique? Rinsing mouth out after use to prevent thrush?    - Smoking cessation?  - Use of NRT?    - Clinic BP?  - S/sx orthostasis?    Reported DM Regimen:  Lantus 30 units nightly  Trulicity 3 mg weekly (Sundays)  Jardiance 25 mg daily    DM medications tried in the past:   Metformin - pt doesn't like it so is not willing to take  Novolog - decreased insulin needs    Medication Adherence and Access: ***  Since last visit, patient denies missing doses of medications recently (including insulin). At last visit, reported using new system of marking down in a paper log book when she takes all meds so she know she doesn't miss or double up on them. Note she has had intermittent non-adherence in the recent past with periods of severe pain.    Using weekly pillbox, which currently is being filled and managed by brother.    Pt had [place] PAP in the past but now has active Medicaid.    SMBG  N/A : ***  Using Libre 3 CGM as instructed. Did not bring CGM reader today so unable to provide data from CGM reports. As noted above, had a couple weeks of lows but more recently has had more highs. Reports highest glucose reading in the past week was >300.    Pt also notes she did not get the correct brand of lancets last time she was prescribed this so requests new Rx for True Metrix brand so she can perform fingerstick BG checks if needed in the future.    Hypoglycemia:    Symptoms of hypoglycemia since last visit: yes  If yes, it was treated by:  unknown    DM-Related Prevention:  Statin: Not taking (stopped statin due to concern for causing elevated CK);  previously on atorvastatin 40 mg (high intensity) , now on ezetimibe 10 mg daily  Aspirin: unclear if indicated (pt reports history of CVA that occurred prior to seeking care at [facility] so not in recent health records); Taking    ACEI/ARB: Not taking (previously on lisinopril 20 mg  but c/f dry cough and ACEi stopped 06/2023); Urine MA/CR Ratio - normal (last checked 01/01/23).  Last eye exam: 04/24/20 - DUE  Last foot exam: 01/01/23  Tobacco Use: Current smoker  Immunizations:   Immunization History   Administered Date(s) Administered    COVID-19 VAC,BIVALENT(84YR UP),PFIZER 08/08/2021    COVID-19 VAC,MRNA,TRIS(12Y UP)(PFIZER)(GRAY CAP) 01/28/2020, 02/18/2020    COVID-19 VACC,MRNA,(PFIZER)(PF) 01/28/2020, 02/18/2020, 08/21/2020    Covid-19 Vac, (12yr+) (Comirnaty) Mrna Pfizer  07/02/2023    Covid-19 Vac, (28yr+) (Spikevax) Monovalent Moderna 08/01/2022    Covid-19 Vacc, Unspecified 01/28/2020, 02/18/2020, 08/21/2020    Hep A / Hep B 09/27/2003, 10/28/2003, 10/17/2006    INFLUENZA TIV (TRI) 63MO+ W/ PRESERV (IM) 02/25/2008, 07/11/2010    INFLUENZA TIV (TRI) PF (IM)(HISTORICAL) 07/01/2013    INFLUENZA VACCINE IIV3(IM)(PF)6 MOS UP 07/02/2023    Influenza Vaccine Quad(IM)6 MO-Adult(PF) 06/09/2015, 01/08/2017, 07/22/2018, 06/30/2020, 07/17/2021, 05/21/2022    Influenza Virus Vaccine, unspecified formulation 06/30/2020    PPD Test 07/06/2014, 07/15/2014, 05/22/2016    Pneumococcal Conjugate 20-valent 12/28/2021    SHINGRIX-ZOSTER VACCINE (HZV),RECOMBINANT,ADJUVANTED(IM) 07/02/2023, 10/14/2023    TdaP 12/19/2011, 05/05/2019     _________________________________________________    Past Medical History: reviewed PMH in epic today    Social History:  Social History     Tobacco Use   Smoking Status Some Days    Current packs/day: 2.00    Average packs/day: 2.0 packs/day for 38.1 years (76.2 ttl pk-yrs)    Types: Cigarettes    Start date: 10/17/1985   Smokeless Tobacco Never       Medications: Medications reviewed in EPIC medication station and updated today by the clinical pharmacist practitioner.     Objective   Review of Systems:  Constitutional:  No fever, chills or unintentional weight loss  Cardiovascular:  No chest pain or pressure, shortness of breath, orthopnea or LE edema  Pulmonary:  No cough or SOB  GI:  No constipation, diarrhea, dyspepsia, change in bowel habits, nausea, abdominal pain  Endocrine: No polyuria, polyphagia, blurred vision    Physical Examination:  Vitals:    There were no vitals filed for this visit.  ***    Wt Readings from Last 3 Encounters:   11/11/23 87.2 kg (192 lb 3.2 oz)   10/21/23 88.8 kg (195 lb 12.8 oz)   10/17/23 89.4 kg (197 lb)       There is no height or weight on file to calculate BMI.    The 10-year ASCVD risk score (Arnett DK, et al., 2019) is: 15.3%    Values used to calculate the score:      Age: 81 years      Sex: Female      Is Non-Hispanic African American: Yes      Diabetic: Yes      Tobacco smoker: Yes      Systolic Blood Pressure: 110 mmHg      Is BP treated: Yes      HDL Cholesterol: 41 mg/dL      Total Cholesterol: 152 mg/dL    Note: For patients with SBP <90 or >200, Total Cholesterol <130 or >320, HDL <20 or >100 which are outside of the allowable range, the calculator will use these upper or lower values to calculate the patient???s risk score.    mMRC:  Most recent MMRC dyspnea scale: 3 Last MMRC date: 05/13/2023    Labs:   Lab Results   Component Value Date    A1C 6.9 (H) 10/17/2023    A1C 6.2 (H)  07/02/2023     Lab Results   Component Value Date    CHOL 152 09/25/2022    TRIG 205 (H) 09/25/2022    HDL 41 09/25/2022    LDL 70 09/25/2022           Assessment/Plan:    1. Diabetes, type 2: controlled per last A1c of 6.9% (10/17/23). Goal <7% per ADA guidelines. *** Unable to assess CGM data without CGM reader today and per pt report seems glucose readings have been somewhat variable with a couple weeks of lows last month and now more frequent hyperglycemia. Since Trulicity has minimal to no risk of hypoglycemia and pt has tolerated higher doses in the past, agreed to increase today even though we don't have confirmed glucose readings from recent weeks. Suspect we may be able to taper insulin in the future again so hope to determine this at next visit as long as pt has CGM reader at that time.  Increase Trulicity from 1.5 to 3 mg SubQ once weekly  Continue Lantus 30 units daily  Continue Jardiance 25 mg daily  Repeat A1c due between Jan and April 2025 - plan to check at next visit (deferred today per pt preference)  Reviewed symptoms and treatment of hypoglycemia, including need to check BG prior to and following treatment.  SMBG instructions:  use Libre 3 CGM as instructed (change sensors every 14 days)  Sent new Rx for lancets with instructions to dispense True Metrix brand per pt preference  Future considerations:  Taper insulin as indicated  Room to titrate Trulicity back up if tolerated (on lower dose after intermittent issues with drug shortages earlier in 2024)    2. Mild COPD (FEV1 >80% on PFTs 04/2022): pt reports only slight improvement in breathing since switching inhaler devices from Stiolto Respimat to Anoro Ellipta at last CPP visit. Still feel pt may benefit from escalation to triple therapy given exacerbation history in the past year and she agrees given better controlled on Advair in the past per her report and suspect recurrent thrush primarily because pt was not rinsing mouth out after use of inhaler. Emphasized importance of rinsing mouth out after ICS-containing inhaler and agreed to escalate inhaler regimen further to Trelegy Ellipta (given pt comfortable w/ this device already).  Continue Trelegy Ellipta 100-62.5-25 mcg 1 puff once daily  Reviewed importance of rinsing mouth out after every dose to prevent thrush  Continue albuterol HFA inhaler (w/ spacer) q4-6h PRN    3. Smoking cessation: pt reports working to reduce # of cigarettes per day but has not yet quit, recently has increased smoking due to stressors at home. Intermittently using NRT patch + gum which do help. Will continue to hold off on stepping down nicotine patch dose since pt still smoking occasionally, but will reconsider once she is ready to quit smoking cigarettes altogether.  Continue nicotine 21 mg/24h patch daily + 4 mg gum PRN  Consider stepping down patch to 14 mg/24h dose as able in the future  Sent updated Rx for nicotine 4 mg gum per pt request    4. ASCVD Prevention: moderate to high risk based on 10-year ASCVD risk score. No longer on high intensity statin due to some concern it was contributing to elevated CK levels, but on ezetimibe since June 2023. Last lipid panel with LDL 70 (09/25/22) and improved from 123 in Oct 2023. Goal LDL <70 per 2022 ACC ECDP given DM + ASCVD risk >7.5%. Since LDL essentially at goal, will not  pursue any additional non-statin LDL lowering therapies at this time.  Continue ezetimibe 10 mg daily    5. Hypertension: controlled based on last clinic BP of 116/64 (09/23/23). Goal <130/80 per ADA guidelines. No further med changes warranted at this time.  Continue amlodipine 10 mg daily  Future considerations:  Resume ARB (possible cough with ACEi) if additional BP lowering needed and choose agent that comes in combo w/ amlodipine if able  May also consider addition of thiazide diuretic if additional BP lowering needed on max doses of other first line agents    6. Medication management and care coordination:  Sent message to [facility] Meghan about pt request for assistance completing NCLIFTS form. SW Ronaldo Miyamoto was able to assist pt today before pt left and held onto paperwork to give to pt's primary CM Meghan in the coming days.        Follow-up: end of January with PCP as scheduled and ~1 month with CPP    Future Appointments   Date Time Provider Department Center   11/20/2023 11:30 AM Melba Coon, CPP [facility]   11/24/2023  9:00 AM Alvino Chapel, Hee Jae, DO [facility]   12/01/2023  9:00 AM [facility] [place] [facility] 1 [facility] [place] [facility]   01/21/2024 11:20 AM Kelle Darting, MD [facility]       I spent a total of 30 minutes face to face with the patient delivering clinical care and providing education/counseling.    _________________________________________________    [name], PharmD, CPP, [facility]  Family Medicine Clinical Pharmacist

## 2023-11-19 NOTE — Unmapped (Incomplete)
 Medications for quitting smoking:  Nicotine patch and gum    COPD medications:  Trelegy Ellipta 1 puff daily - use QUICK and deep breath, rinse mouth after each dose!  Albuterol 1-2 puffs every 4-6 hours as needed for shortness of breath or wheezing - SLOW and deep breath, separate puffs by around 1 minute    Blood pressure medications:  Amlodipine 1 tablet (10 mg) daily    Diabetes medications:  Trulicity 3 mg injection once weekly on Sundays  Lantus 30 units once daily  Jardiance 1 tablet (25 mg) daily    How often and when to check blood sugar:  Use the Freestyle Libre 3 CGM Reader as instructed and change sensors every 14 days.    How to treat low blood sugar:  For blood sugar less than 70 --> Treat with 4 ounces of juice or regular soda, or with 3 to 4 glucose tablets. Re-check blood sugar in 15 minutes.    If blood sugar is still less than 70 on re-check, treat again and re-check in 15 minutes.   If blood sugar is over 70 on re-check and it is time to eat your regular meal, eat regular meal and take insulin as prescribed for that blood sugar.    When to follow-up:  *** with Tobi Bastos (pharmacist)      [name], PharmD, CPP, [facility]  Family Medicine Clinical Pharmacist  Clinic Phone: [phone]

## 2023-11-20 NOTE — Unmapped (Signed)
 Nurse left message for patient to let us know the pharmacy.

## 2023-11-20 NOTE — Unmapped (Signed)
 Patient is calling in and she states that she is needing a refill on all her medications, due to her insurance coverage changing. I tried getting further information from her but she was very hostile and stated that we do not do nothing for her. She also stated that she is ready to go [facility]

## 2023-11-21 DIAGNOSIS — E1165 Type 2 diabetes mellitus with hyperglycemia: Principal | ICD-10-CM

## 2023-11-21 DIAGNOSIS — Z794 Long term (current) use of insulin: Principal | ICD-10-CM

## 2023-11-21 MED ORDER — FREESTYLE LIBRE 3 PLUS SENSOR DEVICE
3 refills | 0.00 days | Status: CP
Start: 2023-11-21 — End: 2024-11-20

## 2023-11-21 NOTE — Unmapped (Signed)
 Patient confirmed the pharmacy to be   [facility] - [place], [place] - [address] [place] [address]  [address] [place], [place] [address]  Phone: [phone]  Fax: [phone]

## 2023-11-21 NOTE — Unmapped (Signed)
 Libra sensor sent to pharmacy

## 2023-11-24 NOTE — Unmapped (Signed)
 [facility]  Care Management Progress Note               Purpose of contact: mediation refill     Insurance: Patient was changed from [employer] medicaid to [employer]. She found out when she called medicaid transportation.     Oxycodone: Patient said Dr. Bari Edward put in order for oxycodone but she could not pick it up until today. She said she ran about about a week ago, and when she went to pick it up they needed a PA.     SW called [facility]: patient was offered a 5 day supply but PA would be needed; if she fills the 5 days she would need a new prescription written for the remaining amount once PA is received.     Patient called on 3/10 requesting PA, phone note routed urgently to RN Ermalinda Barrios, SW updated patient PA request has been submitted.     Shea Stakes, CCM  [facility] Manager  [facility]  [phone]

## 2023-11-24 NOTE — Unmapped (Signed)
 Copied from CRM #[id number]. Topic: Access To Clinicians - Medication Refill  >> Nov 24, 2023  1:26 PM Eliberto Ivory B wrote:  The caller reports that they have previously requested refill from pharmacy, but have not received authorization yet.     The patient is requesting the following:     Medication(s) for refill: oxyCODONE (ROXICODONE) 5 MG immediate release tablet   Quantity for 3 month supply    Pharmacy name and address: [facility]  [address] [place], [place] [address] [phone]    Patient called in from the pharmacy in [place]. Patient states that the medication that is being requested is still needing a Prior authorization submitted before the pharmacy can fill the medication. Patient is requesting a call back from a manager about their refill request       Please contact The patient by Cell Phone in regards to this request.    Coverage: yes, coverage is accurate on file.    Urgent callback turnaround time: within 24 business hours. (Caller Notified)    Urgent Reason: Almost or completely out of medication(s)

## 2023-11-24 NOTE — Unmapped (Signed)
 Copied from CRM #[id number]. Topic: Access To Clinicians - Req Clinic Call Back  >> Nov 24, 2023  4:47 PM Elvis Coil wrote:  Clinical Advice: Pt called upset that she has been trying to get a PA for her medication for over a week and she went to her pharmacy and they stated they have been reaching out and still do not have the RX. She stated she has been having to call every month for the PA and the same issue has been occurring.     She asked if there is another alternative, even a shot she can get to bypass calling every month.       They have been seen for this issue previously.  The patient preferred contact: Cell Phone Urgent callback turnaround time: within 24 business hours. Programmer, systems Notified)

## 2023-11-24 NOTE — Unmapped (Signed)
 PA sent to [employer] for oxycodone awaiting results. KEYDonnamarie Poag

## 2023-11-24 NOTE — Unmapped (Unsigned)
 [facility]  Pulmonary Clinic - Initial Visit    Referring Physician :  Miguel Rota  PCP:     Rudie Meyer, MD  Reason for Consult:   ***     ASSESSMENT and PLAN     Problem List  #  #COPD  #Allergic Rhinitis  #Multiple Sclerosis  #HTN  #DM  #Hypothyroidism  #Schizoaffective d/o    History of Present Illness: Tonya Wood is a 58 y.o. female who is seen in consultation at the request of Corene Cornea, Lajuana Ripple* for comprehensive evaluation of ***.    # ***  -   - RTC in     Plan of care was discussed with the patient who acknowledged understanding and is in agreement.    Patient will No follow-ups on file. or sooner if needed.    Ms. Matson was seen, examined and discussed with {Pulmonary Attendings:81951} who agrees with the assessment and plan above.     Jebediah Macrae Griffith Citron, DO  Pulmonary and Critical Care Fellow    HISTORY:      History of Present Illness: Tonya Wood is a 58 y.o. female who is seen for chief complaint of ***.    Patient accompanied by *** today.     Asthma:  Seasonal allergies: ***  Eczema: ***  Childhood asthma: ***  Aspirin/NSAID sensitivity: ***  Triggers: ***    Exacerbations: ***  Nocturnal Symptoms: ***  Trigger avoidance: ***  Peak flows: ***  Rescue Inhaler use: ***  Controller inhaler adherance: ***    COPD  Exacerbations: ***  Rescue inhaler use: ***  Controller inhaler adherence: ***  Tobacco use: ***  mMRC:     0 I only get breathless with strenuous exercise  1 I get short of breath when hurrying on level ground or walking up a slight hill  2 On level ground, I walk slower than people of the same age because of breathlessness, or have to stop for a breath when walking at my own pace  3 I stop for a breath after walking about 100 yards or after a few minutes on level ground  4 I am too breathless to leave the house or I am breathless when dressing    Autoimmune ROS  Rash: ***  Morning stiffness: ***  Joint swelling: ***  Fevers: ***  Night sweats: ***  Digital ulceration: ***  Fingertip skin stiffening: ***  Raynaud's: ***  Dysphagia: ***  Sicca: ***    Exposure/Occupational:   *** Admits/Denies Tobacco use: *** ppd/years  *** Admits/Denies Vaping:   *** Admits/Denies Illicit drug use:   - Admits to: ***  - *** Pets/Animals (Cat, Dog, Birds)    ILD-Associated Risk Factors  [{YES/NO:103896::No  }] Medications - amiodarone, bleomycin, cyclophosphamide, methotrexate, nitrofurantoin, penicillamine, other chemotherapy  [{YES/NO:103896::No  }] Asbestos  [{YES/NO:103896::No  }] Beryllium, glass cutting, mining activities, silica (sandblasting), woodworking  [{YES/NO:103896::No  }] Farm work or [employer] farming  [{YES/NO:103896::No  }] Pet birds/raise birds or down pillow/bedding  [{YES/NO:103896::No  }] Mold - mold damage at home or home ever flooded  [{YES/NO:103896::No  }] Hot tub with standing water or sauna exposure/use  [{YES/NO:103896::No  }] Swamp cooler    Family history:  - No family history of lung diseases, lung cancers, autoimmune conditions ***     Social History:  - Occupation: ***  - Lives with: ***    OSH records and referral documentation reviewed.     Review of  Systems:  A comprehensive review of systems was completed and negative except as noted in HPI.    Relevant Medications: Reviewed    Allergies: Reviewed.    Other History:  The past medical history, surgical history, social history, family history, medications and allergies were personally reviewed and updated in the patient's electronic medical record. Pertinent items are noted above.        PHYSICAL EXAM:      Physical Exam:There were no vitals filed for this visit.  There is no height or weight on file to calculate BMI.    General: Well developed, well nourished, BMI 30  HEENT: EOMI, anicteric sclera, trachea midline  CV: Regular rate and rhythm, no murmurs  Resp: Clear to auscultation bilaterally, no wheezing or rhonchi  GI: Soft, non tender  Extremities: No bilateral lower extremity swelling  Neuro: A&Ox3, Moving all extremities spontaneously  ***         LABORATORY and RADIOLOGY DATA:     Pulmonary Function Test Results:    Date FEV1  (% Pred) FVC   (% Pred) FEV1/FVC DLCO  (% Pred) TLC Desat  Distance   11/24/23 1.65 (60)  1.82 (67) 2.49 (73)  2.67 (78) 66%  68% 89%                  : n/a    Pertinent Laboratory Data:  Reviewed    Pertinent Imaging Data:  Images were personally reviewed with attending.       Javier Gell Griffith Citron, DO  Pulmonary and Critical Care Fellow

## 2023-11-25 NOTE — Unmapped (Signed)
 Patient notified

## 2023-11-25 NOTE — Unmapped (Signed)
 Approved on March 10 by [facility] Medicaid 2017 NCPDP  Request Reference Number: [id number]. OXYCODONE TAB 5MG  is approved through 05/26/2024. For further questions, call [employer] at [phone].  Effective Date: 11/24/2023  Authorization Expiration Date: 05/26/2024

## 2023-11-27 NOTE — Unmapped (Signed)
 Tonya Wood has been contacted in regards to their refill of dimethyl fumarate 240 mg Cpdr. At this time, they have declined refill due to  Need updated insurance pt will call back . Refill assessment call date has been updated per the patient's request.

## 2023-12-17 ENCOUNTER — Ambulatory Visit
Admit: 2023-12-17 | Discharge: 2023-12-18 | Attending: Student in an Organized Health Care Education/Training Program | Primary: Student in an Organized Health Care Education/Training Program

## 2023-12-17 LAB — FERRITIN: FERRITIN: 64.2 ng/mL (ref 7.3–270.7)

## 2023-12-17 LAB — COMPREHENSIVE METABOLIC PANEL
ALBUMIN: 4.6 g/dL (ref 3.4–5.0)
ALKALINE PHOSPHATASE: 102 U/L (ref 46–116)
ALT (SGPT): 24 U/L (ref 10–49)
ANION GAP: 11 mmol/L (ref 5–14)
AST (SGOT): 22 U/L (ref <=34)
BILIRUBIN TOTAL: 0.6 mg/dL (ref 0.3–1.2)
BLOOD UREA NITROGEN: 11 mg/dL (ref 9–23)
BUN / CREAT RATIO: 24
CALCIUM: 10.7 mg/dL — ABNORMAL HIGH (ref 8.7–10.4)
CHLORIDE: 103 mmol/L (ref 98–107)
CO2: 31 mmol/L (ref 20.0–31.0)
CREATININE: 0.45 mg/dL — ABNORMAL LOW (ref 0.55–1.02)
EGFR CKD-EPI (2021) FEMALE: >90 mL/min/1.73m2 (ref >=60)
GLUCOSE RANDOM: 148 mg/dL (ref 70–179)
POTASSIUM: 3.7 mmol/L (ref 3.5–5.1)
PROTEIN TOTAL: 7.8 g/dL (ref 5.7–8.2)
SODIUM: 145 mmol/L (ref 135–145)

## 2023-12-17 LAB — ALBUMIN / CREATININE URINE RATIO
ALBUMIN QUANT URINE: 2.3 mg/dL
ALBUMIN/CREATININE RATIO: 37.2 ug/mg — ABNORMAL HIGH (ref 0.0–30.0)
CREATININE, URINE: 61.9 mg/dL

## 2023-12-17 LAB — MAGNESIUM: MAGNESIUM: 1.5 mg/dL — ABNORMAL LOW (ref 1.6–2.6)

## 2023-12-17 LAB — THYROID FUNCTION CASCADE: THYROID STIMULATING HORMONE: 4.293 u[IU]/mL (ref 0.550–4.780)

## 2023-12-17 MED ORDER — ALBUTEROL SULFATE HFA 90 MCG/ACTUATION AEROSOL INHALER
Freq: Four times a day (QID) | RESPIRATORY_TRACT | 1 refills | 25 days | Status: CP | PRN
Start: 2023-12-17 — End: 2024-12-16

## 2023-12-17 NOTE — Unmapped (Addendum)
 Tonya Wood is reporting an episode of postmenopausal bleeding. No bleeding today. Unable to perform pelvic exam as young family members accompanied her to today's visit.   Plan: TVUS to evaluate endometrial lining for thickening   Contingency: If indicated, endometrial Bx at scheduled Gyn appt on 5/2 with [facility]   Orders:    [place] Endovaginal (Non-OB); Future    Ferritin; Future

## 2023-12-17 NOTE — Unmapped (Signed)
 [facility]  Established Patient Clinic Note    Assessment & Plan  Tonya Wood is a 58 y.o.female    Assessment & Plan  Type 2 diabetes mellitus without complication, with long-term current use of insulin  A1c Goal: <7%   Prior A1c: 6.9%  Current Tx: Trulicity 3mg  weekly (Sundays) and Lantus 30 units nightly   Plan: Will inquire if able to receive refills given medications lost. Obtain urine test.   Future Needs: retinal eye exam, foot exam, and repeat A1c  Orders:    Comprehensive Metabolic Panel; Future    Albumin/creatinine urine ratio; Future    Essential hypertension  Goal BP: <130/<80, not at goal   Prior Tx: furosemide, amlodipine/lisinopril, amlodipine/benazepril (side effects)   Current Tx: amlodipine 10mg    Plan: continue current tx   Contingency: consider ARB since prior side effects with ACEi   Orders:    Comprehensive Metabolic Panel; Future    Other specified hypothyroidism  Prior TSH: 1.450  Current Tx: levothyroxine   Plan: obtain TSH given out of meds; continue current tx   Orders:    Thyroid Function Cascade; Future    Medication management  Ms. Tonya Wood is reporting that had to leave all of her medications at the church retreat during which she was unable to take her medications. Returned home on 4/1. Has been without her medications for about 2 weeks.     Medication Reconciliation performed during today's visit with Ms. Tonya Wood:   - NEEDS: sensors, cream (unsure of the name, lost by young family member)  - HAS: Tylenol, albuterol neb, Tums, and nicotine gum at home   - Additionally NEEDS: albuterol inhaler, Symmetrel, amlodipine, aspirin, baclofen, Freestyle Sensor, Reader, Zyrtec, vitamin D, colchicine, insulin - Trulicity (Sundays), Lantus 30u, duloxetine, Jardiance, Miralax, levothyroxine, Lyrica, Oxycodone   - Per chart review, refills available for Peridex, Jardiance, Zetia, Flonase, Trelegy, magnesium, Seroquel, Depakote     I called the patient's pharmacy to inquire about which medications are available for pickup given available refills vs which need new prescriptions. I was on the phone for 16 minutes. We reviewed the following medications:     - Jardiance too soon, filled on 3/31 so will need to call insurance   - Zetia can fill   - Flonase can fill   - Trelegy Ellipta will need to be process, processed and can be filed   - Magnesium can be filled   - Seroquel can fill   - Depakote can be filled   - Trulicity, insurance will not pay as filled on 3/18, 2 pens for 28 day supply   - Lantus, insurance will not pay as filled on 3/26   - Rx can be sent for albuterol inhaler, sent new Rx   - Amlodipine should be able to do   - Levothyroxine, insurance will not pay as filled on 3/26   - Duloxetine should be able to do   - Pregabalin last in Nov. 2024, should be able to do   - Baclofen can fill   - Amantadine can fill         Postmenopausal bleeding  Tonya Wood is reporting an episode of postmenopausal bleeding. No bleeding today. Unable to perform pelvic exam as young family members accompanied her to today's visit.   Plan: TVUS to evaluate endometrial lining for thickening   Contingency: If indicated, endometrial Bx at scheduled Gyn appt on 5/2 with [facility]   Orders:  [place] Endovaginal (Non-OB); Future    Ferritin; Future    Hypomagnesemia  Plan: check magnesium level   Orders:    Magnesium Level; Future    Chronic, continuous use of opioids  Current Tx: oxycodone   Bowel Regimen: Miralax   Did NOT refill oxycodone Rx  Future Needs: UTox and refill of Miralax   - Of note, reporting insurance change and inquiring about Pain Management referral.          Chronic obstructive pulmonary disease, unspecified COPD type  Current Tx: albuterol nebulizer PRN, albuterol inhaler PRN, DuoNebs PRN, flutic/    Orders:    albuterol HFA 90 mcg/actuation inhaler; Inhale 2 puffs by mouth every six (6) hours as needed.        Subjective   Tonya Wood is a 58 y.o. female coming to clinic today for the following issues: Follow-up (General Health)    History of Present Illness:    Follow Up: Tonya Wood reports that out of medication for 2 weeks.She went to a church retreat. It was supposed to be for 2 weeks. She was unaware that would not be able to use her phone and not take her medications. Her medications were taken away from her. She left on 3/17 for the retreat. She returned on 4/1. Additionally, reports bleeding from her vagina. LMP was years ago. She also had some rectal bleeding. She reports more from the back. She reports some cramping as well. She had blood with wiping.     ROS:    Review of Systems   Gastrointestinal:  Positive for abdominal pain, blood in stool and constipation. Negative for diarrhea and melena.   Genitourinary:  Negative for dysuria, flank pain, frequency and urgency.        Vaginal bleeding    Musculoskeletal:  Positive for back pain and joint pain.   All other systems reviewed and are negative.      I have reviewed past medical history, past surgical history, family history, social history, allergies, health maintenance/care gaps, medications, and problem list, and have updated/reconciled them as indicated.     Objective   BP 134/87 (BP Site: R Arm, BP Position: Sitting, BP Cuff Size: Medium)  - Pulse 94  - Temp 36.4 ??C (97.5 ??F) (Temporal)  - Wt 85.2 kg (187 lb 12.8 oz)  - BMI 29.41 kg/m??   Physical Exam  Vitals reviewed.   Constitutional:       Appearance: Normal appearance.   HENT:      Head: Normocephalic and atraumatic.      Nose: Congestion present.      Mouth/Throat:      Mouth: Mucous membranes are moist.   Eyes:      General: No scleral icterus.     Conjunctiva/sclera: Conjunctivae normal.   Cardiovascular:      Rate and Rhythm: Normal rate.      Pulses: Normal pulses.      Heart sounds: Normal heart sounds.   Pulmonary:      Effort: Pulmonary effort is normal.      Breath sounds: Normal breath sounds.   Neurological:      General: No focal deficit present.      Mental Status: She is alert. Mental status is at baseline.       Labs & Imaging: Reviewed pertinent labs and imaging in Epic; see Assessment & Plan.   Procedure(s): No      [facility] - [address]  [place]  at [facility]  [address], [place], [place] [address]   Telephone [phone]  Fax [phone]  [email or web address]

## 2023-12-17 NOTE — Unmapped (Signed)
 A1c Goal: <7%   Prior A1c: 6.9%  Current Tx: Trulicity 3mg  weekly (Sundays) and Lantus 30 units nightly   Plan: Will inquire if able to receive refills given medications lost. Obtain urine test.   Future Needs: retinal eye exam, foot exam, and repeat A1c  Orders:    Comprehensive Metabolic Panel; Future    Albumin/creatinine urine ratio; Future

## 2023-12-17 NOTE — Unmapped (Addendum)
 Goal BP: <130/<80, not at goal   Prior Tx: furosemide, amlodipine/lisinopril, amlodipine/benazepril (side effects)   Current Tx: amlodipine 10mg    Plan: continue current tx   Contingency: consider ARB since prior side effects with ACEi   Orders:    Comprehensive Metabolic Panel; Future

## 2023-12-17 NOTE — Unmapped (Addendum)
 Prior TSH: 1.450  Current Tx: levothyroxine   Plan: obtain TSH given out of meds; continue current tx   Orders:    Thyroid Function Cascade; Future

## 2023-12-17 NOTE — Unmapped (Addendum)
 Current Tx: oxycodone   Bowel Regimen: Miralax   Did NOT refill oxycodone Rx  Future Needs: UTox and refill of Miralax   - Of note, reporting insurance change and inquiring about Pain Management referral.

## 2023-12-17 NOTE — Unmapped (Addendum)
 Current Tx: albuterol nebulizer PRN, albuterol inhaler PRN, DuoNebs PRN, flutic/    Orders:    albuterol HFA 90 mcg/actuation inhaler; Inhale 2 puffs by mouth every six (6) hours as needed.

## 2023-12-17 NOTE — Unmapped (Deleted)
 Orders:    Comprehensive Metabolic Panel; Future    Albumin/creatinine urine ratio; Future
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
## 2023-12-18 NOTE — Unmapped (Signed)
 Copied from CRM #[id number]. Topic: Access To Clinicians - Medication Question  >> Dec 18, 2023  4:17 PM Aris Lot T wrote:  Patient had an appt on 4/2 with Dr. Landry Dyke and she was supposed to put in some medications for patient and patient stated she hasn't received them ( oxyCOdone and 800 motrin ) . Paitient also has a tele visit with Dr, Bari Edward 4/4.     [facility] - [place], [place] - [address]  [address] [place] [address]  Phone: [phone] Fax: [phone]  Hours: Not open 24 hours    Patient stated Dr. Pernell Dupre was supposed to call her at the end of the day and patient stated she was rushed to the hospital towards the end of yesterday

## 2023-12-18 NOTE — Unmapped (Deleted)
 Video Visit Note  This medical encounter was conducted virtually using Epic@  TeleHealth protocols.    I have identified myself to the patient and conveyed my credentials to Ms. Keough  Patient/proxy has provided verbal consent to proceed with this video visit.  In case we get disconnected, [phone] (home)   In case of Emergency, patient's current location: Home: [address]  [place] [address]  Is there someone else in the room? No.     Ms. Amenta is a 58 y.o. female that presents for a video visit today regarding the following issues:    Assessment/Plan:      Problem List Items Addressed This Visit    None      Followup:  No follow-ups on file.    {    Coding tips - Do not edit this text, it will delete upon signing of note!    Telephone visits [phone] for Physicians and APPs and [phone] for Non- Physician Clinicians)- Only use minutes on the phone to determine level of service.    Video visits [phone]) - Use either level of medical decision making just as an in-person visit OR time which includes both minutes on video and pre/post minutes to determine the level of service.      :[id number]}  The patient reports they are physically located in [place] and is currently: at home. I conducted a phone visit.  I spent *** minutes on the phone call with the patient on the date of service .         Subjective:     HPI  Ms. Kun is a 58 y.o. female requesting a video visit to discuss the following issues:    ***    ROS  As per HPI.    HISTORY  I have reviewed the patient's problem list, current medications, and allergies and have updated/reconciled them as needed.    Objective:   Patient Reported Blood Pressure Readings    General: Well appearing , in no acute distress  Skin: Color is normal. No rashes.  Psych: Appropriate affect, normal mood  Resp: Normal work of breathing, no retractions

## 2023-12-19 ENCOUNTER — Ambulatory Visit: Admit: 2023-12-19 | Discharge: 2023-12-20

## 2023-12-19 DIAGNOSIS — G894 Chronic pain syndrome: Principal | ICD-10-CM

## 2023-12-19 MED ORDER — OXYCODONE 5 MG TABLET
ORAL_TABLET | Freq: Three times a day (TID) | ORAL | 0 refills | 30 days | Status: CP | PRN
Start: 2023-12-19 — End: 2024-01-18

## 2023-12-19 MED ORDER — LANTUS SOLOSTAR U-100 INSULIN 100 UNIT/ML (3 ML) SUBCUTANEOUS PEN
Freq: Every evening | SUBCUTANEOUS | 2 refills | 50 days | Status: CP
Start: 2023-12-19 — End: ?

## 2023-12-19 MED ORDER — FREESTYLE LIBRE 3 PLUS SENSOR DEVICE
3 refills | 0 days | Status: CP
Start: 2023-12-19 — End: 2024-12-18

## 2023-12-19 MED ORDER — DULAGLUTIDE 3 MG/0.5 ML SUBCUTANEOUS PEN INJECTOR
SUBCUTANEOUS | 3 refills | 84 days | Status: CP
Start: 2023-12-19 — End: ?

## 2023-12-19 MED ORDER — BLOOD GLUCOSE TEST STRIPS
ORAL_STRIP | 11 refills | 0 days | Status: CP
Start: 2023-12-19 — End: 2024-12-18

## 2023-12-19 NOTE — Unmapped (Signed)
 Tonya Wood has been contacted in regards to their refill of dimethyl fumarate. At this time, they have declined refill due to  not needing it at this time. Declined refill in March due to insurance change . Refill assessment call date has been updated per the patient's request.

## 2023-12-19 NOTE — Unmapped (Signed)
 I called patient's pharmacy to assess if   1) Patient picked up prescriptions on 4/3   2) Follow up about the following: sensor + reader + aspirin      Pharmacy representative confirms that 4/3 pick up included: Zetia, amantadine, amlodipine, baclofen (never filled), peridex, duloxetine, Depakote, Flonase, magnesium, Pregabalin (never filled), Seroquel, Trelegy (never filled before)     No readers available. Working on getting more sensors, should be available today. Per pharmacy system review, should have two months of aspirin available still.     I plan to call patient to review lab results as well as discuss medications/issues with a few remaining medications.     Landry Dyke, MD  Career Development Fellow   [facility] Medicine [facility]

## 2023-12-19 NOTE — Unmapped (Signed)
 Addressed via telephone call, see encounter on 4/4.

## 2023-12-19 NOTE — Unmapped (Signed)
 I called Tonya Wood to discuss additional medication needs. We discussed that I was confused given that mentioned during 4/2 visit that did not want to continue oxycodone. Given that numerous medication needs, prioritized other needs during 4/2 visit. Ms. Terris clarified that would like to continue oxycodone at this time for chronic pain management.     Tonya Wood informed that insurance would not cover insulin refills at this time, per my conversation with pharmacy on 4/2. She went to the ED on 4/3 for shortness of breath. Reporting that received Fentanyl during her evaluation and not sure why she did. The Fentanyl did not sit well on her stomach and she was very sick. She is currently with her brother now and starting to feel better.     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 She is emphasizing that urgent for her to see an eye specialist - was supposed to see one in [place] with [facility].     Emphasized that no ibuprofen use while investigating bleeding. Will need additional labs at next visit (CBC, PT/INR).     Plan:   - Refill for oxycodone for continued chronic pain. Will need an appointment for Utox before future refills can be provided - will message Administrative Specialist to assist with appointment scheduling.   - NO ibuprofen will investigating ongoing bleeding   - Scheduled TVUS  - At next visit: pelvic exam, rectal exam, additional labs    Future Considerations: May need EGD and Colonoscopy. Per Ms. Bobbi's request, work to reduce medication burden; thus, deprescribe as appropriate.     Landry Dyke, MD  Career Development Fellow   [facility] Medicine [facility]

## 2023-12-19 NOTE — Unmapped (Signed)
 4/2 request addressed on 4/4 - please see separate telephone encounter.     Landry Dyke, MD  Career Development Fellow   [facility] Medicine [facility]

## 2023-12-19 NOTE — Unmapped (Signed)
 Patient Telehealth Visit Note        Assessment/Plan:   Assessment & Plan  Diabetes Mellitus  - Refill Trulicity and Lantus prescriptions.  - Refill glucose test strips.    Chronic nonmalignant pain   - Contact Dr. Pernell Dupre regarding oxycodone prescription.    Adverse Reaction to Fentanyl  Severe vomiting and malaise post-fentanyl administration during EMS transport.  - Consider alternative pain management strategies if symptoms persist.      No follow-ups on file.      Visit Format/Coding: Telephone Call (Audio Only): Full E/M Visit: Medicare/Medicaid/Aetna/Cigna/Humana/UMR/: In charge capture section or LOS section use 99441 (5-10 minutes); 99442 (11-20 minutes); and 56213 (21-30 minutes)  This visit is conducted via telephone or video conferencing.    The patient reports they are physically located in [place] and is currently: at home. I conducted a phone visit.  I spent 15 minutes on the phone call with the patient on the date of service .          SUBJECTIVE    Subjective     This is a 58 y.o. female who presents for a telehealth visit today with had concerns including Follow-up.   History of Present Illness  The patient presents with issues related to medication management following a recent emergency department visit.    Two days ago, she visited the emergency department at [facility] after experiencing an episode of dyspnea and immobility while lying down. EMS note below    H- Pt has a hx of MS. She reported that she suddenly began to feel weak at 1900   when she was sitting on the couch. She was able to walk to her bed to go lay   down and began feeling short of breath. Family on scene called 911 for the   patient. Pt and family denied any recent falls. Pt reported that she had   recently been seen for blood in her stool by her PCP today. Pt reported that   there was blood in her vaginal discharge as well as her stool. Pt reported the   blood loss to be minimal. No recent fevers or illness. ORFD initiated supplemental O2 at 15lpm via NRB prior to EMS arrival.      A- On arrival to the scene the patient was found laying supine in her bed,   conscious and alert with labored breathing, radial pulses present in both upper   extremities. Pt was on supplemental O2, LS were clear and equal bilaterally. Hx   obtained from family as stated above. The patient reported that she could not   move her legs, pt was extricated from the home via tarp. She was placed on the   stretcher and secured with seatbelts and wings. Stroke screen negative. The   patient was complaining of pain all over, also had some generalized pain on   palpation of the abdomen, she rated her pain to be 9/10 that comes in waves. Pt   reported that her SOB had improved, supplemental O2 was discontinued. Pt   reported that she was unable to move her feet in the house, normal motor   function and sensation once moved to the ambulance.      R- 20G IV access established in the left forearm. fentanyl administered   for pain.      T- Pt was transported routine traffic to [facility]. She rested comfortably   throughout transport. Vitals were continuously monitored. Pt reported feeling  anxious, verbal de-escalation was used to calm the patient. IV access   established during transport, fentanyl administered for pain. On arrival to   [facility], the patient was placed in a wheelchair and taken to triage. Report was   given to charge RN Dahlia Client, care transferred to same.   She had a bad reaction to the fentanyl, resulting in persistent vomiting and feeling very sick since the emergency department visit.        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  Remainder of systems review notable for; patient reports that her prescriptions for insulin and oxycodone were not provided during her recent visit with Dr. Pernell Dupre. She emphasizes the necessity of these medications, stating 'I desperately have to have' them. She is currently on Trulicity and Lantus for diabetes management and requires test strips for monitoring. She mentions a misunderstanding with Dr. Pernell Dupre regarding her oxycodone prescription, as she had expressed a desire to eventually transition to injections but did not intend for her current prescription to be discontinued. Additionally, she notes an issue with her medical chart, stating 'somehow I got deleted off my chart,' which has contributed to the confusion regarding her medication prescriptions.     Objective     OBJECTIVE  No Physical Exam was completed due to inability for visual confirmation    Labs, Imaging, and Other Clinical Data:  I have reviewed the labs, imaging studies, and other clinical data associated with this encounter.  See Epic Labs and Imaging section for details.                        [facility]  [place] at [facility]  [address] [place], [place] [address]  Telephone [phone]  Fax [phone]  [email or web address]

## 2023-12-22 NOTE — Unmapped (Signed)
 [facility]  Care Management Progress Note               Purpose of contact: SW followed up on provider request to assist patient with care coordination.      Attempted to reach patient via telephone; unable to leave voicemail as mailbox is full, not set up, or not accepting messages.     Alveta Awe, CCM  [facility] Manager  [facility] Family Medicine  [phone]
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
## 2023-12-30 NOTE — Unmapped (Signed)
 called patient to advise urgent care is unable to answer questions regrding paperwork f/u with PCP

## 2023-12-31 ENCOUNTER — Ambulatory Visit
Admit: 2023-12-31 | Discharge: 2024-01-01 | Payer: Medicaid (Managed Care) | Attending: Student in an Organized Health Care Education/Training Program | Primary: Student in an Organized Health Care Education/Training Program

## 2023-12-31 LAB — TOXICOLOGY SCREEN, URINE
AMPHETAMINE SCREEN URINE: NEGATIVE
BARBITURATE SCREEN URINE: NEGATIVE
BENZODIAZEPINE SCREEN, URINE: NEGATIVE
BUPRENORPHINE, URINE SCREEN: NEGATIVE
CANNABINOID SCREEN URINE: NEGATIVE
COCAINE(METAB.)SCREEN, URINE: NEGATIVE
FENTANYL SCREEN, URINE: NEGATIVE
METHADONE SCREEN, URINE: NEGATIVE
OPIATE SCREEN URINE: NEGATIVE
OXYCODONE SCREEN URINE: POSITIVE — AB

## 2023-12-31 NOTE — Unmapped (Signed)
 [facility] - [facility]  Established Patient Clinic Note    Assessment & Plan  Tonya Wood is a 58 y.o.female    Assessment & Plan  Chronic pain syndrome  Controlled Medication Agreement: yes, signed 06/18/2023 (see Media - Consents)     Sources of Pain: cervical spine, thoracic spine, lumbar spine, right shoulder, knees, dental; multiple sclerosis     Prior Opioid Tx: oxycodone 5mg  BID PRN severe pain  Current Opioid Tx: oxycodone 5mg  TID PRN severe pain not responsive to Tylenol/Motrin   Multimodal Tx: acetaminophen 1000mg  TID, ibuprofen 600mg  TID PRN with food, cyclobenzaprine 5mg  TID, duloxetine 60mg  daily, pregabalin 75mg  TID  Bowel Regimen: Miralax daily   Pain Management: no, have been attempting to establish with Pain Management (multiple insurance changes).      Reviewed PDMP. Last PDMP Review: 12/31/2023 11:46 AM  Utox: last 2/25 was negative. Obtain today as received Rx for oxycodone after last visit.   Rx Provided: oxycodone 5mg  TID PRN severe pain not responsive to Tylenol/Motrin (start 5/7)   Follow Up Appt: 5/6 with PCP. MUST have in-person visits.     Orders:    Toxicology Screen, Urine    oxyCODONE (ROXICODONE) 5 MG immediate release tablet; Take 1 tablet (5 mg total) by mouth every eight (8) hours as needed for pain (Severe pain unresponsive to acetaminophen/ibuprofen).      Administrative encounter  Letter provided stating that able to manage own finances independently. See Letters for copy of letter.        Medication management  Tonya Wood brought to today's appt a letter re: Trelegy requiring PA. Discussed PA via CoverMyMeds with LPN. Confirmed that PA needed for Trelegy, likely due to quantity limit. LPN recommended 30-day supply. PA submitted for Trelegy - 1 puff daily for 30 days (see separate documentation).     Per prior visit on 4/2 and conversation with pharmacy, unable to fill Trulicity as too soon from last fill. Similar issue with sensors.     I called the patient's pharmacy today, spoke with Tonya Wood re: Trulicity. Trulicity is now able to be filled. However, when attempted to process, now requiring authorization (PA). Message was to resubmit with supply of 6 or less. They were already processing for one box (54mL)/28 day supply. Thus, we need to submit a PA.     Per Tonya Wood, sensors are now able to be filled. Attempted to process and also requiring a PA.     Plan: will inform LPN that PA needed via CoverMyMeds for Trulicity and [employer] 3+ Sensors.            Subjective   Tonya Wood is a 58 y.o. female coming to clinic today for the following issues: Medication Refill (Paperwork )    History of Present Illness:    Paperwork: Needs a letter for disability informing that able to manage her own income/finances. Will be getting custody of three children soon; currently accompanied to today's visit with four children.   Other Updates: Heard from [employer]. Still waiting to hear back from Wood Tonya Wood. Getting ready to move to [place], possibly in May.    Medication Issues: Insurance will not pay for Trulicity and Sensors. She went to the pharmacy yesterday. Spoke with Tonya Wood - insurance is rejecting, needs authorization.   Letter is needed after losing her entire wallet. Her daughter was her prior representative. Her current home is too stressful. ior representative. Her current home is too much stress.  ROS:    ROS    I have reviewed past medical history, past surgical history, family history, social history, allergies, health maintenance/care gaps, medications, and problem list, and have updated/reconciled them as indicated.     Objective   BP 125/79 (BP Position: Sitting, BP Cuff Size: Medium)  - Pulse 92  - Temp 36.2 ??C (97.2 ??F) (Temporal)  - Wt 39 kg (86 lb)  - BMI 13.47 kg/m??   Physical Exam  Constitutional:       Appearance: Normal appearance.   HENT:      Tonya: Normocephalic and atraumatic.      Right Ear: External ear normal.      Left Ear: External ear normal.      Nose: Nose normal.      Mouth/Throat:      Mouth: Mucous membranes are moist.   Eyes:      Conjunctiva/sclera: Conjunctivae normal.   Cardiovascular:      Rate and Rhythm: Normal rate.   Pulmonary:      Effort: Pulmonary effort is normal.   Neurological:      General: No focal deficit present.      Mental Status: She is alert. Mental status is at baseline.       Labs & Imaging: Reviewed pertinent labs and imaging in Epic; see Assessment & Plan.   Procedure(s): No      [facility] - [facility]  at [facility]  [address], [place], [place] [address]   Telephone [phone]  Fax [phone]  [email or web address]

## 2023-12-31 NOTE — Unmapped (Addendum)
 Controlled Medication Agreement: yes, signed 06/18/2023 (see Media - Consents)     Sources of Pain: cervical spine, thoracic spine, lumbar spine, right shoulder, knees, dental; multiple sclerosis     Prior Opioid Tx: oxycodone  5mg  BID PRN severe pain  Current Opioid Tx: oxycodone  5mg  TID PRN severe pain not responsive to Tylenol /Motrin    Multimodal Tx: acetaminophen  1000mg  TID, ibuprofen  600mg  TID PRN with food, cyclobenzaprine  5mg  TID, duloxetine  60mg  daily, pregabalin  75mg  TID  Bowel Regimen: Miralax  daily   Pain Management: no, have been attempting to establish with Pain Management (multiple insurance changes).      Reviewed PDMP. Last PDMP Review: 12/31/2023 11:46 AM  Utox: last 2/25 was negative. Obtain today as received Rx for oxycodone  after last visit.   Rx Provided: oxycodone  5mg  TID PRN severe pain not responsive to Tylenol /Motrin  (start 5/7)   Follow Up Appt: 5/6 with PCP. MUST have in-person visits.     Orders:    Toxicology Screen, Urine    oxyCODONE  (ROXICODONE ) 5 MG immediate release tablet; Take 1 tablet (5 mg total) by mouth every eight (8) hours as needed for pain (Severe pain unresponsive to acetaminophen /ibuprofen ).

## 2023-12-31 NOTE — Unmapped (Signed)
 PA sent to Covermymeds for Trelegy Ellipta  100-62.5-25MCG/ACT aerosol powder awaiting results. ZOX:[id number]

## 2023-12-31 NOTE — Unmapped (Signed)
 PA sent to Covermymeds awaiting results. KEY:

## 2024-01-01 NOTE — Unmapped (Signed)
 [facility]  Care Management Progress Note               Purpose of contact: SW called patient to check in.     Attempted to reach patient via telephone; left voicemail with contact information.    Alveta Awe, CCM  [facility] Manager  [facility]  [phone]

## 2024-01-05 DIAGNOSIS — Z794 Long term (current) use of insulin: Principal | ICD-10-CM

## 2024-01-05 DIAGNOSIS — E1159 Type 2 diabetes mellitus with other circulatory complications: Principal | ICD-10-CM

## 2024-01-05 DIAGNOSIS — E1165 Type 2 diabetes mellitus with hyperglycemia: Principal | ICD-10-CM

## 2024-01-05 MED ORDER — FREESTYLE LIBRE 3 READER
Freq: Every day | 0 refills | 0.00 days
Start: 2024-01-05 — End: ?

## 2024-01-06 NOTE — Unmapped (Addendum)
 PA submitted through covermymeds for Trulicity  3 mg Key: [id number] and Freestyle Libre 3 Plus Sensor Key: [id number].    Trulicity  3 mg Approved until 01/05/2025.  Freestyle Libre 3 Plus Sensor Approved until 01/05/2025.

## 2024-01-09 ENCOUNTER — Inpatient Hospital Stay: Admit: 2024-01-09 | Discharge: 2024-01-10 | Payer: Medicaid (Managed Care)

## 2024-01-09 DIAGNOSIS — R9389 Abnormal findings on diagnostic imaging of other specified body structures: Principal | ICD-10-CM

## 2024-01-09 DIAGNOSIS — N950 Postmenopausal bleeding: Principal | ICD-10-CM

## 2024-01-09 NOTE — Unmapped (Signed)
 I called Ms. Tonya Wood to review TVUS results: thickened endometrium. I emphasized the importance of attending the upcoming [facility] appt on 5/2 and asking that a tissue sample (endometrial biopsy). Ms. Tonya Wood expressed understanding and will ensure that she attends that appt.     I was unable to send an outside staff message to Daniel Durand, MD.     Rosena Conradi, MD  Career Development Fellow   [facility] Medicine [facility]

## 2024-01-10 MED ORDER — FREESTYLE LIBRE 3 READER
Freq: Every day | 0 refills | 0.00000 days | Status: CP
Start: 2024-01-10 — End: ?

## 2024-01-15 DIAGNOSIS — G8929 Other chronic pain: Principal | ICD-10-CM

## 2024-01-15 DIAGNOSIS — M545 Chronic bilateral low back pain without sciatica: Principal | ICD-10-CM

## 2024-01-15 MED ORDER — IBUPROFEN 800 MG TABLET
ORAL_TABLET | 1 refills | 0.00000 days
Start: 2024-01-15 — End: ?

## 2024-01-16 DIAGNOSIS — G894 Chronic pain syndrome: Principal | ICD-10-CM

## 2024-01-16 MED ORDER — OXYCODONE 5 MG TABLET
ORAL_TABLET | Freq: Three times a day (TID) | ORAL | 0 refills | 30.00000 days | Status: CN | PRN
Start: 2024-01-16 — End: 2024-02-15

## 2024-01-16 NOTE — Unmapped (Signed)
 Discussed with PCP. I provided the transfer Rx for oxycodone to the new requested pharmacy. PCP plans to fill the other medications; however, may be too soon and may disrupt PA in process.     Rosena Conradi, MD  Career Development Fellow   [facility] Medicine [facility]

## 2024-01-16 NOTE — Unmapped (Signed)
 Copied from CRM #[id number]. Topic: Access To Clinicians - Medication Refill  >> Jan 16, 2024  8:28 AM Silvester Drown T wrote:  Medication was sent to the wrong pharmacy. Please resend:  * Patient wants to switch pharmacy and wants all her current meds switched to new.  Current pharmacy  [facility] [place], [place] - [address] [place] [address]   Phone: [phone]  Fax: [phone]    Wants it switch to the [facility] [place]  Patient stated its the one on [address]    The patient is requesting the following:     Medication(s) for refill: Every single one prescribed     Pharmacy name and address:   [facility] ([address] )  [place]     *Patient asked for Dr. Rosena Conradi or PCP to please reach out to her.  Patient Phone #[phone]    Please contact The patient by Cell Phone in regards to this request.    Coverage: yes, coverage is accurate on file.    Medication request callback turnaround time: 72 business hours. Programmer, systems Notified)

## 2024-01-16 NOTE — Unmapped (Signed)
 Addended by: Hamp Levine D on: 01/16/2024 01:12 PM     Modules accepted: Orders

## 2024-01-21 ENCOUNTER — Ambulatory Visit
Admit: 2024-01-21 | Payer: Medicaid (Managed Care) | Attending: Student in an Organized Health Care Education/Training Program | Primary: Student in an Organized Health Care Education/Training Program

## 2024-01-21 DIAGNOSIS — M545 Chronic bilateral low back pain without sciatica: Principal | ICD-10-CM

## 2024-01-21 DIAGNOSIS — E1159 Type 2 diabetes mellitus with other circulatory complications: Principal | ICD-10-CM

## 2024-01-21 DIAGNOSIS — J309 Allergic rhinitis, unspecified: Principal | ICD-10-CM

## 2024-01-21 DIAGNOSIS — G35 Multiple sclerosis: Principal | ICD-10-CM

## 2024-01-21 DIAGNOSIS — E039 Hypothyroidism, unspecified: Principal | ICD-10-CM

## 2024-01-21 DIAGNOSIS — E119 Type 2 diabetes mellitus without complications: Principal | ICD-10-CM

## 2024-01-21 DIAGNOSIS — F1190 Opioid use, unspecified, uncomplicated: Principal | ICD-10-CM

## 2024-01-21 DIAGNOSIS — E785 Hyperlipidemia, unspecified: Principal | ICD-10-CM

## 2024-01-21 DIAGNOSIS — Z794 Long term (current) use of insulin: Principal | ICD-10-CM

## 2024-01-21 DIAGNOSIS — M109 Gout, unspecified: Principal | ICD-10-CM

## 2024-01-21 DIAGNOSIS — G8929 Other chronic pain: Principal | ICD-10-CM

## 2024-01-21 DIAGNOSIS — I10 Essential (primary) hypertension: Principal | ICD-10-CM

## 2024-01-21 DIAGNOSIS — J441 Chronic obstructive pulmonary disease with (acute) exacerbation: Principal | ICD-10-CM

## 2024-01-21 DIAGNOSIS — J449 Chronic obstructive pulmonary disease, unspecified: Principal | ICD-10-CM

## 2024-01-21 DIAGNOSIS — R5383 Other fatigue: Principal | ICD-10-CM

## 2024-01-21 DIAGNOSIS — N393 Stress incontinence (female) (male): Principal | ICD-10-CM

## 2024-01-21 DIAGNOSIS — F17200 Nicotine dependence, unspecified, uncomplicated: Principal | ICD-10-CM

## 2024-01-21 DIAGNOSIS — F250 Schizoaffective disorder, bipolar type: Principal | ICD-10-CM

## 2024-01-21 MED ORDER — LANCETS
11 refills | 0.00000 days | Status: CP
Start: 2024-01-21 — End: 2025-01-20

## 2024-01-21 MED ORDER — COLCHICINE 0.6 MG TABLET
ORAL_TABLET | Freq: Every day | ORAL | 3 refills | 90.00000 days | Status: CP
Start: 2024-01-21 — End: 2025-01-20

## 2024-01-21 MED ORDER — AMLODIPINE 10 MG TABLET
ORAL_TABLET | Freq: Every day | ORAL | 3 refills | 90.00000 days | Status: CP
Start: 2024-01-21 — End: 2025-01-20

## 2024-01-21 MED ORDER — DIAPER,BRIEF,ADULT,DISPOSABLE
Freq: Every day | 10 refills | 0.00000 days | Status: CP | PRN
Start: 2024-01-21 — End: 2025-01-20

## 2024-01-21 MED ORDER — MAGNESIUM OXIDE 400 MG (241.3 MG MAGNESIUM) TABLET
ORAL_TABLET | Freq: Every day | ORAL | 11 refills | 30.00000 days | Status: CP
Start: 2024-01-21 — End: 2025-01-20

## 2024-01-21 MED ORDER — DULAGLUTIDE 3 MG/0.5 ML SUBCUTANEOUS PEN INJECTOR
SUBCUTANEOUS | 3 refills | 84.00000 days | Status: CP
Start: 2024-01-21 — End: ?

## 2024-01-21 MED ORDER — TRELEGY ELLIPTA 100 MCG-62.5 MCG-25 MCG POWDER FOR INHALATION
Freq: Every day | RESPIRATORY_TRACT | 3 refills | 180.00000 days | Status: CP
Start: 2024-01-21 — End: ?

## 2024-01-21 MED ORDER — LEVOTHYROXINE 100 MCG TABLET
ORAL_TABLET | Freq: Every day | ORAL | 2 refills | 90.00000 days | Status: CP
Start: 2024-01-21 — End: ?

## 2024-01-21 MED ORDER — EMPAGLIFLOZIN 25 MG TABLET
ORAL_TABLET | Freq: Every day | ORAL | 3 refills | 90.00000 days | Status: CP
Start: 2024-01-21 — End: 2025-01-20

## 2024-01-21 MED ORDER — CHOLECALCIFEROL (VITAMIN D3) 1,250 MCG (50,000 UNIT) CAPSULE
ORAL_CAPSULE | ORAL | 0 refills | 168.00000 days | Status: CP
Start: 2024-01-21 — End: ?

## 2024-01-21 MED ORDER — OPTICHAMBER(AEROCHAMBER) ADULT
0 refills | 1.00000 days | Status: CP | PRN
Start: 2024-01-21 — End: ?

## 2024-01-21 MED ORDER — POLYETHYLENE GLYCOL 3350 17 GRAM/DOSE ORAL POWDER
Freq: Every day | ORAL | 0 refills | 30.00000 days | Status: CP | PRN
Start: 2024-01-21 — End: ?

## 2024-01-21 MED ORDER — BLOOD-GLUCOSE METER KIT WRAPPER
ORAL | 11 refills | 0.00000 days | Status: CP
Start: 2024-01-21 — End: 2025-01-20

## 2024-01-21 MED ORDER — DIMETHYL FUMARATE 240 MG CAPSULE,DELAYED RELEASE
ORAL_CAPSULE | Freq: Two times a day (BID) | ORAL | 1 refills | 90.00000 days | Status: CP
Start: 2024-01-21 — End: ?

## 2024-01-21 MED ORDER — NICOTINE 21 MG/24 HR DAILY TRANSDERMAL PATCH
MEDICATED_PATCH | TRANSDERMAL | 2 refills | 28.00000 days | Status: CP
Start: 2024-01-21 — End: ?

## 2024-01-21 MED ORDER — PEN NEEDLE, DIABETIC 31 GAUGE X 5/16" (8 MM)
ORAL | 11 refills | 0.00000 days | Status: CP
Start: 2024-01-21 — End: 2025-01-20

## 2024-01-21 MED ORDER — BACLOFEN 10 MG TABLET
ORAL_TABLET | Freq: Three times a day (TID) | ORAL | 1 refills | 90.00000 days | Status: CP | PRN
Start: 2024-01-21 — End: 2024-07-19

## 2024-01-21 MED ORDER — ALBUTEROL SULFATE HFA 90 MCG/ACTUATION AEROSOL INHALER
Freq: Four times a day (QID) | RESPIRATORY_TRACT | 1 refills | 25.00000 days | Status: CP | PRN
Start: 2024-01-21 — End: 2025-01-20

## 2024-01-21 MED ORDER — OXYCODONE 5 MG TABLET
ORAL_TABLET | Freq: Three times a day (TID) | ORAL | 0 refills | 30.00000 days | Status: CP | PRN
Start: 2024-01-21 — End: 2024-02-20

## 2024-01-21 MED ORDER — EZETIMIBE 10 MG TABLET
ORAL_TABLET | Freq: Every day | ORAL | 3 refills | 90.00000 days | Status: CP
Start: 2024-01-21 — End: 2025-01-20

## 2024-01-21 MED ORDER — LANTUS SOLOSTAR U-100 INSULIN 100 UNIT/ML (3 ML) SUBCUTANEOUS PEN
Freq: Every evening | SUBCUTANEOUS | 2 refills | 50.00000 days | Status: CP
Start: 2024-01-21 — End: ?

## 2024-01-21 MED ORDER — BLOOD GLUCOSE TEST STRIPS
ORAL_STRIP | ORAL | 11 refills | 0.00000 days | Status: CP
Start: 2024-01-21 — End: 2025-01-20

## 2024-01-21 MED ORDER — IPRATROPIUM 0.5 MG-ALBUTEROL 3 MG (2.5 MG BASE)/3 ML NEBULIZATION SOLN
Freq: Four times a day (QID) | RESPIRATORY_TRACT | 3 refills | 15.00000 days | Status: CP | PRN
Start: 2024-01-21 — End: 2025-01-20

## 2024-01-21 MED ORDER — NICOTINE (POLACRILEX) 4 MG GUM
ORAL | 3 refills | 5.00000 days | Status: CP | PRN
Start: 2024-01-21 — End: ?

## 2024-01-21 MED ORDER — DULOXETINE 60 MG CAPSULE,DELAYED RELEASE
ORAL_CAPSULE | Freq: Every day | ORAL | 3 refills | 90.00000 days | Status: CP
Start: 2024-01-21 — End: 2025-01-15

## 2024-01-21 MED ORDER — FLUTICASONE PROPIONATE 50 MCG/ACTUATION NASAL SPRAY,SUSPENSION
Freq: Every day | NASAL | 11 refills | 120.00000 days | Status: CP
Start: 2024-01-21 — End: 2025-01-20

## 2024-01-21 MED ORDER — QUETIAPINE 200 MG TABLET
ORAL_TABLET | Freq: Every evening | ORAL | 3 refills | 90.00000 days | Status: CP
Start: 2024-01-21 — End: 2025-01-20

## 2024-01-21 MED ORDER — PREGABALIN 75 MG CAPSULE
ORAL_CAPSULE | Freq: Three times a day (TID) | ORAL | 1 refills | 90.00000 days | Status: CP
Start: 2024-01-21 — End: 2025-01-20

## 2024-01-21 MED ORDER — AMANTADINE HCL 100 MG CAPSULE
ORAL_CAPSULE | Freq: Two times a day (BID) | ORAL | 1 refills | 90.00000 days | Status: CP
Start: 2024-01-21 — End: ?

## 2024-01-21 MED ORDER — CETIRIZINE 10 MG TABLET
ORAL_TABLET | Freq: Every day | ORAL | 3 refills | 90.00000 days | Status: CP
Start: 2024-01-21 — End: 2025-01-20

## 2024-01-21 MED ORDER — DIVALPROEX ER 500 MG TABLET,EXTENDED RELEASE 24 HR
ORAL_TABLET | Freq: Every day | ORAL | 2 refills | 45.00000 days | Status: CP
Start: 2024-01-21 — End: 2025-01-20

## 2024-01-21 NOTE — Unmapped (Signed)
 [facility] Prescription faxed to [phone]

## 2024-01-21 NOTE — Unmapped (Signed)
 Copied from CRM [id number]. Topic: Other - Other  >> Jan 21, 2024  9:35 AM Fate Honor wrote:  The PAC has received an incoming clinical call:    Caller name: Tonya Wood                                   Best callback number: [phone]  Relationship to Patient: self  Describe the reason for the call: Need new order for depends that are delivered by [facility]  Supply to 3x(up size). She is expecting an order today. She will just hold onto those depends until the new order is delivered.

## 2024-01-21 NOTE — Unmapped (Signed)
 Order placed and script sent to clerk to fax to the company.

## 2024-01-22 NOTE — Unmapped (Signed)
 [facility]  Care Management Progress Note               Purpose of contact: SW called patient to check in.     Patient said she is just waking up. She has received some of her medications but not her motrin. She has not been able to go to the [facility] to get a replacement driver's license.     Scheduled follow up with PCP for 5/30 at 10:40am.    Patient has SW's contact information and SW encouraged patient to call SW with any questions.    Alveta Awe, CCM  [facility] Manager  [facility]  [phone]

## 2024-01-26 MED ORDER — IBUPROFEN 800 MG TABLET
ORAL_TABLET | ORAL | 1 refills | 0.00000 days | Status: CP
Start: 2024-01-26 — End: ?

## 2024-01-28 NOTE — Unmapped (Signed)
 Nurse received fax PA for Jardiance 25MG  tablets. PA sent to Covermymeds awaiting results. KEY: [id number]

## 2024-01-30 NOTE — Unmapped (Signed)
 [facility]  Care Management Progress Note               Purpose of contact: returning call    Patient left SW a voicemail requesting a return call. SW called 2x - no answer, voicemail not set up.     Alveta Awe, CCM  [facility] Manager  [facility]  [phone]

## 2024-01-30 NOTE — Unmapped (Signed)
 Specialty Medication(s): dimethyl fumarate    Tonya Wood has been dis-enrolled from the [facility] specialty pharmacy services as a result of patient refusing refills and is now without insurance.    Additional information provided to the patient: n/a - clinic aware    [name], PharmD  [facility] Specialty Pharmacist

## 2024-02-05 ENCOUNTER — Ambulatory Visit
Admit: 2024-02-05 | Discharge: 2024-02-06 | Payer: Medicaid (Managed Care) | Attending: Family Medicine | Primary: Family Medicine

## 2024-02-07 DIAGNOSIS — F1190 Opioid use, unspecified, uncomplicated: Principal | ICD-10-CM

## 2024-02-07 MED ORDER — POLYETHYLENE GLYCOL 3350 17 GRAM/DOSE ORAL POWDER
0 refills | 0.00000 days
Start: 2024-02-07 — End: ?

## 2024-02-10 MED ORDER — POLYETHYLENE GLYCOL 3350 17 GRAM/DOSE ORAL POWDER
ORAL | 0 refills | 0.00000 days | Status: CP
Start: 2024-02-10 — End: ?

## 2024-02-12 ENCOUNTER — Ambulatory Visit: Admit: 2024-02-12 | Discharge: 2024-02-13 | Payer: Medicaid (Managed Care)

## 2024-02-12 MED ORDER — DULAGLUTIDE 3 MG/0.5 ML SUBCUTANEOUS PEN INJECTOR
SUBCUTANEOUS | 3 refills | 56.00000 days | Status: CP
Start: 2024-02-12 — End: ?

## 2024-02-14 ENCOUNTER — Emergency Department
Admit: 2024-02-14 | Discharge: 2024-02-14 | Disposition: A | Discharge status: Home / Self Care | Payer: Medicaid (Managed Care)

## 2024-02-14 MED ORDER — AMOXICILLIN 875 MG-POTASSIUM CLAVULANATE 125 MG TABLET
ORAL_TABLET | Freq: Two times a day (BID) | ORAL | 0 refills | 5.00000 days | Status: CP
Start: 2024-02-14 — End: 2024-02-19

## 2024-02-14 MED ORDER — OXYCODONE 5 MG TABLET
ORAL_TABLET | ORAL | 0 refills | 1.00000 days | Status: CP | PRN
Start: 2024-02-14 — End: 2024-02-14

## 2024-02-17 MED ORDER — OXYCODONE 5 MG TABLET
ORAL_TABLET | Freq: Three times a day (TID) | ORAL | 0 refills | 30.00000 days | Status: CP | PRN
Start: 2024-02-17 — End: 2024-03-18

## 2024-02-19 ENCOUNTER — Ambulatory Visit: Admit: 2024-02-19 | Payer: Medicaid (Managed Care)

## 2024-02-19 ENCOUNTER — Encounter: Admit: 2024-02-19 | Payer: Medicaid (Managed Care)

## 2024-02-23 ENCOUNTER — Ambulatory Visit: Admit: 2024-02-23 | Discharge: 2024-02-24 | Payer: Medicaid (Managed Care)

## 2024-02-23 DIAGNOSIS — N760 Acute vaginitis: Principal | ICD-10-CM

## 2024-02-23 MED ORDER — FLUCONAZOLE 150 MG TABLET
ORAL_TABLET | Freq: Once | ORAL | 0 refills | 2.00000 days | Status: CP
Start: 2024-02-23 — End: 2024-02-23

## 2024-03-15 MED ORDER — NALOXONE 4 MG/ACTUATION NASAL SPRAY
Freq: Once | NASAL | 2 refills | 0.00000 days | Status: CP | PRN
Start: 2024-03-15 — End: ?

## 2024-03-16 MED ORDER — OXYCODONE 5 MG TABLET
ORAL_TABLET | Freq: Three times a day (TID) | ORAL | 0 refills | 30.00000 days | Status: CP | PRN
Start: 2024-03-16 — End: 2024-04-15

## 2024-03-31 MED ORDER — OXYCODONE 5 MG TABLET
ORAL_TABLET | Freq: Three times a day (TID) | ORAL | 0 refills | 15.00000 days | Status: CP
Start: 2024-03-31 — End: 2024-04-15

## 2024-04-13 MED ORDER — OXYCODONE 5 MG TABLET
ORAL_TABLET | Freq: Three times a day (TID) | ORAL | 0 refills | 15.00000 days | Status: CN
Start: 2024-04-13 — End: 2024-04-28

## 2024-04-13 MED ORDER — ONDANSETRON 4 MG DISINTEGRATING TABLET
ORAL_TABLET | Freq: Three times a day (TID) | 0 refills | 4.00000 days | Status: CP | PRN
Start: 2024-04-13 — End: 2024-04-20

## 2024-04-13 MED ORDER — IBUPROFEN 800 MG TABLET
ORAL_TABLET | Freq: Three times a day (TID) | ORAL | 1 refills | 20.00000 days | Status: CP | PRN
Start: 2024-04-13 — End: ?

## 2024-05-03 ENCOUNTER — Emergency Department
Admit: 2024-05-03 | Discharge: 2024-05-03 | Disposition: A | Discharge status: Home / Self Care | Payer: Medicaid (Managed Care)

## 2024-05-12 MED ORDER — FREESTYLE LIBRE 3 PLUS SENSOR DEVICE
ORAL | 3 refills | 0.00000 days | Status: CP
Start: 2024-05-12 — End: 2025-05-12

## 2024-05-12 MED ORDER — DIVALPROEX ER 500 MG TABLET,EXTENDED RELEASE 24 HR
ORAL_TABLET | Freq: Every day | ORAL | 2 refills | 45.00000 days | Status: CP
Start: 2024-05-12 — End: 2025-05-12

## 2024-05-12 MED ORDER — NICOTINE 21 MG/24 HR DAILY TRANSDERMAL PATCH
MEDICATED_PATCH | TRANSDERMAL | 2 refills | 28.00000 days | Status: CP
Start: 2024-05-12 — End: ?

## 2024-05-12 MED ORDER — NICOTINE (POLACRILEX) 4 MG GUM
ORAL | 3 refills | 5.00000 days | Status: CP | PRN
Start: 2024-05-12 — End: ?

## 2024-05-12 MED ORDER — OXYCODONE 5 MG TABLET
ORAL_TABLET | Freq: Three times a day (TID) | ORAL | 0 refills | 30.00000 days | Status: CP
Start: 2024-05-12 — End: 2024-06-11

## 2024-06-04 DIAGNOSIS — J449 Chronic obstructive pulmonary disease, unspecified: Principal | ICD-10-CM

## 2024-06-04 MED ORDER — VENTOLIN HFA 90 MCG/ACTUATION AEROSOL INHALER
1 refills | 0.00000 days
Start: 2024-06-04 — End: ?

## 2024-06-06 MED ORDER — VENTOLIN HFA 90 MCG/ACTUATION AEROSOL INHALER
RESPIRATORY_TRACT | 1 refills | 0.00000 days | Status: CP
Start: 2024-06-06 — End: ?

## 2024-06-09 DIAGNOSIS — G8929 Other chronic pain: Principal | ICD-10-CM

## 2024-06-09 DIAGNOSIS — M545 Chronic bilateral low back pain without sciatica: Principal | ICD-10-CM

## 2024-06-09 DIAGNOSIS — G894 Chronic pain syndrome: Principal | ICD-10-CM

## 2024-06-09 DIAGNOSIS — M48062 Spinal stenosis, lumbar region with neurogenic claudication: Principal | ICD-10-CM

## 2024-06-11 MED ORDER — OXYCODONE 5 MG TABLET
ORAL_TABLET | Freq: Three times a day (TID) | ORAL | 0 refills | 30.00000 days | Status: CP
Start: 2024-06-11 — End: 2024-07-11

## 2024-06-15 DIAGNOSIS — G8929 Other chronic pain: Principal | ICD-10-CM

## 2024-06-15 DIAGNOSIS — M48062 Spinal stenosis, lumbar region with neurogenic claudication: Principal | ICD-10-CM

## 2024-06-15 DIAGNOSIS — M545 Chronic bilateral low back pain without sciatica: Principal | ICD-10-CM

## 2024-06-18 DIAGNOSIS — R32 Unspecified urinary incontinence: Principal | ICD-10-CM

## 2024-06-18 MED ORDER — INCONTINENCE PAD, LINER, DISPOSABLE
2 refills | 0.00000 days | Status: CP
Start: 2024-06-18 — End: ?

## 2024-06-18 MED ORDER — OXYCODONE 5 MG TABLET
ORAL_TABLET | Freq: Three times a day (TID) | ORAL | 0 refills | 30.00000 days | Status: CP
Start: 2024-06-18 — End: 2024-07-18

## 2024-06-29 DIAGNOSIS — F17200 Nicotine dependence, unspecified, uncomplicated: Principal | ICD-10-CM

## 2024-06-29 MED ORDER — VITAMIN D2 1,250 MCG (50,000 UNIT) CAPSULE
ORAL_CAPSULE | 0 refills | 0.00000 days
Start: 2024-06-29 — End: ?

## 2024-06-29 MED ORDER — NICOTINE (POLACRILEX) 4 MG GUM
ORAL | 3 refills | 0.00000 days | PRN
Start: 2024-06-29 — End: ?

## 2024-06-30 MED ORDER — VITAMIN D2 1,250 MCG (50,000 UNIT) CAPSULE
ORAL_CAPSULE | ORAL | 0 refills | 0.00000 days | Status: CP
Start: 2024-06-30 — End: ?

## 2024-06-30 MED ORDER — NICOTINE (POLACRILEX) 4 MG GUM
ORAL | 3 refills | 1.00000 days | Status: CP | PRN
Start: 2024-06-30 — End: ?

## 2024-07-19 DIAGNOSIS — M48062 Spinal stenosis, lumbar region with neurogenic claudication: Principal | ICD-10-CM

## 2024-07-19 DIAGNOSIS — G894 Chronic pain syndrome: Principal | ICD-10-CM

## 2024-07-19 DIAGNOSIS — G8929 Other chronic pain: Principal | ICD-10-CM

## 2024-07-19 DIAGNOSIS — M545 Chronic bilateral low back pain without sciatica: Principal | ICD-10-CM

## 2024-07-19 MED ORDER — OXYCODONE 5 MG TABLET
ORAL_TABLET | Freq: Three times a day (TID) | ORAL | 0 refills | 30.00000 days | Status: CP
Start: 2024-07-19 — End: 2024-08-18

## 2024-07-22 DIAGNOSIS — J441 Chronic obstructive pulmonary disease with (acute) exacerbation: Principal | ICD-10-CM

## 2024-07-22 MED ORDER — ALBUTEROL SULFATE 2.5 MG/3 ML (0.083 %) SOLUTION FOR NEBULIZATION
RESPIRATORY_TRACT | 0 refills | 0.00000 days | Status: CP
Start: 2024-07-22 — End: ?

## 2024-08-10 DIAGNOSIS — J449 Chronic obstructive pulmonary disease, unspecified: Principal | ICD-10-CM

## 2024-08-10 MED ORDER — VENTOLIN HFA 90 MCG/ACTUATION AEROSOL INHALER
RESPIRATORY_TRACT | 1 refills | 0.00000 days | Status: CP
Start: 2024-08-10 — End: ?

## 2024-08-17 MED ORDER — OXYCODONE 5 MG TABLET
ORAL_TABLET | Freq: Three times a day (TID) | ORAL | 0 refills | 30.00000 days | Status: CP
Start: 2024-08-17 — End: 2024-09-16

## 2024-08-17 MED ORDER — TRELEGY ELLIPTA 100 MCG-62.5 MCG-25 MCG POWDER FOR INHALATION
Freq: Every day | RESPIRATORY_TRACT | 3 refills | 180.00000 days | Status: CP
Start: 2024-08-17 — End: ?

## 2024-08-17 MED ORDER — LANTUS SOLOSTAR U-100 INSULIN 100 UNIT/ML (3 ML) SUBCUTANEOUS PEN
Freq: Every evening | SUBCUTANEOUS | 11 refills | 30.00000 days | Status: CP
Start: 2024-08-17 — End: 2025-08-17

## 2024-08-17 MED ORDER — ALBUTEROL SULFATE HFA 90 MCG/ACTUATION AEROSOL INHALER
Freq: Four times a day (QID) | RESPIRATORY_TRACT | 1 refills | 25.00000 days | Status: CP | PRN
Start: 2024-08-17 — End: 2025-02-13

## 2024-08-20 DIAGNOSIS — M545 Chronic bilateral low back pain without sciatica: Principal | ICD-10-CM

## 2024-08-20 DIAGNOSIS — M549 Dorsalgia, unspecified: Principal | ICD-10-CM

## 2024-08-20 DIAGNOSIS — G8929 Other chronic pain: Principal | ICD-10-CM

## 2024-08-20 DIAGNOSIS — G894 Chronic pain syndrome: Principal | ICD-10-CM

## 2024-08-20 MED ORDER — OXYCODONE 5 MG TABLET
ORAL_TABLET | Freq: Three times a day (TID) | ORAL | 0 refills | 7.00000 days | Status: CP
Start: 2024-08-20 — End: 2024-08-27

## 2024-09-14 MED ORDER — DIMETHYL FUMARATE 240 MG CAPSULE,DELAYED RELEASE
ORAL_CAPSULE | Freq: Two times a day (BID) | ORAL | 1 refills | 90.00000 days | Status: CP
Start: 2024-09-14 — End: ?

## 2024-09-14 MED ORDER — OXYCODONE 5 MG TABLET
ORAL_TABLET | Freq: Three times a day (TID) | ORAL | 0 refills | 30.00000 days | Status: CP
Start: 2024-09-14 — End: 2024-10-14

## 2024-09-14 MED ORDER — IBUPROFEN 800 MG TABLET
ORAL_TABLET | Freq: Three times a day (TID) | ORAL | 1 refills | 20.00000 days | Status: CP | PRN
Start: 2024-09-14 — End: ?

## 2024-09-20 DIAGNOSIS — F250 Schizoaffective disorder, bipolar type: Principal | ICD-10-CM

## 2024-09-20 MED ORDER — QUETIAPINE 200 MG TABLET
ORAL_TABLET | Freq: Every evening | ORAL | 3 refills | 0.00000 days
Start: 2024-09-20 — End: ?

## 2024-09-21 MED ORDER — QUETIAPINE 200 MG TABLET
ORAL_TABLET | Freq: Every evening | ORAL | 3 refills | 90.00000 days | Status: CP
Start: 2024-09-21 — End: ?

## 2024-10-01 ENCOUNTER — Ambulatory Visit

## 2024-10-12 MED ORDER — TRULICITY 4.5 MG/0.5 ML SUBCUTANEOUS PEN INJECTOR
0 refills | 0.00000 days
Start: 2024-10-12 — End: ?

## 2024-10-13 MED ORDER — TRULICITY 4.5 MG/0.5 ML SUBCUTANEOUS PEN INJECTOR
SUBCUTANEOUS | 0 refills | 0.00000 days | Status: CP
Start: 2024-10-13 — End: ?

## 2024-10-15 DIAGNOSIS — M48062 Spinal stenosis, lumbar region with neurogenic claudication: Secondary | ICD-10-CM

## 2024-10-15 DIAGNOSIS — G8929 Other chronic pain: Secondary | ICD-10-CM

## 2024-10-15 DIAGNOSIS — G894 Chronic pain syndrome: Secondary | ICD-10-CM

## 2024-10-15 DIAGNOSIS — M545 Chronic bilateral low back pain without sciatica: Principal | ICD-10-CM

## 2024-10-15 MED ORDER — OXYCODONE 5 MG TABLET
ORAL_TABLET | Freq: Three times a day (TID) | ORAL | 0 refills | 30.00000 days | Status: CP
Start: 2024-10-15 — End: 2024-11-14
